# Patient Record
Sex: Male | Born: 1940 | Race: White | Hispanic: No | Smoking: Former smoker
Health system: Southern US, Community
[De-identification: ages and names within clinical notes are randomized; demographics above are authoritative.]

## PROBLEM LIST (undated history)

## (undated) DIAGNOSIS — I739 Peripheral vascular disease, unspecified: Secondary | ICD-10-CM

## (undated) DIAGNOSIS — M109 Gout, unspecified: Secondary | ICD-10-CM

## (undated) DIAGNOSIS — C609 Malignant neoplasm of penis, unspecified: Secondary | ICD-10-CM

## (undated) DIAGNOSIS — N189 Chronic kidney disease, unspecified: Secondary | ICD-10-CM

## (undated) DIAGNOSIS — Z720 Tobacco use: Secondary | ICD-10-CM

## (undated) DIAGNOSIS — E119 Type 2 diabetes mellitus without complications: Secondary | ICD-10-CM

## (undated) DIAGNOSIS — E785 Hyperlipidemia, unspecified: Secondary | ICD-10-CM

## (undated) DIAGNOSIS — I10 Essential (primary) hypertension: Secondary | ICD-10-CM

## (undated) DIAGNOSIS — M1990 Unspecified osteoarthritis, unspecified site: Secondary | ICD-10-CM

## (undated) DIAGNOSIS — IMO0001 Reserved for inherently not codable concepts without codable children: Secondary | ICD-10-CM

## (undated) DIAGNOSIS — I2510 Atherosclerotic heart disease of native coronary artery without angina pectoris: Secondary | ICD-10-CM

## (undated) DIAGNOSIS — R9439 Abnormal result of other cardiovascular function study: Secondary | ICD-10-CM

## (undated) HISTORY — PX: PENECTOMY: SHX741

## (undated) HISTORY — DX: Tobacco use: Z72.0

## (undated) HISTORY — DX: Atherosclerotic heart disease of native coronary artery without angina pectoris: I25.10

## (undated) HISTORY — DX: Malignant neoplasm of penis, unspecified: C60.9

## (undated) HISTORY — DX: Type 2 diabetes mellitus without complications: E11.9

## (undated) HISTORY — DX: Hyperlipidemia, unspecified: E78.5

## (undated) HISTORY — DX: Chronic kidney disease, unspecified: N18.9

---

## 1978-01-03 HISTORY — PX: BLADDER SURGERY: SHX569

## 2002-08-16 ENCOUNTER — Ambulatory Visit (HOSPITAL_COMMUNITY): Admission: RE | Admit: 2002-08-16 | Discharge: 2002-08-16 | Payer: Self-pay | Admitting: Urology

## 2003-11-12 ENCOUNTER — Ambulatory Visit: Payer: Self-pay | Admitting: Family Medicine

## 2004-02-24 ENCOUNTER — Ambulatory Visit: Payer: Self-pay | Admitting: Family Medicine

## 2004-07-26 ENCOUNTER — Ambulatory Visit: Payer: Self-pay | Admitting: Family Medicine

## 2004-08-13 ENCOUNTER — Observation Stay (HOSPITAL_COMMUNITY): Admission: RE | Admit: 2004-08-13 | Discharge: 2004-08-14 | Payer: Self-pay | Admitting: Urology

## 2005-11-29 ENCOUNTER — Encounter: Payer: Self-pay | Admitting: Cardiology

## 2008-02-15 ENCOUNTER — Encounter: Payer: Self-pay | Admitting: Cardiology

## 2008-02-19 ENCOUNTER — Ambulatory Visit: Payer: Self-pay | Admitting: Cardiology

## 2008-03-18 ENCOUNTER — Ambulatory Visit: Payer: Self-pay | Admitting: Cardiology

## 2008-03-18 ENCOUNTER — Encounter: Payer: Self-pay | Admitting: Cardiology

## 2008-03-19 ENCOUNTER — Ambulatory Visit: Payer: Self-pay | Admitting: Cardiology

## 2008-06-11 ENCOUNTER — Encounter: Payer: Self-pay | Admitting: Cardiology

## 2008-06-15 ENCOUNTER — Encounter: Payer: Self-pay | Admitting: Cardiology

## 2008-07-18 ENCOUNTER — Encounter: Payer: Self-pay | Admitting: Cardiology

## 2008-07-28 ENCOUNTER — Encounter: Payer: Self-pay | Admitting: Physician Assistant

## 2008-07-28 ENCOUNTER — Ambulatory Visit: Payer: Self-pay | Admitting: Cardiology

## 2008-09-02 ENCOUNTER — Encounter: Payer: Self-pay | Admitting: Cardiology

## 2008-09-19 ENCOUNTER — Encounter (INDEPENDENT_AMBULATORY_CARE_PROVIDER_SITE_OTHER): Payer: Self-pay | Admitting: *Deleted

## 2008-09-22 ENCOUNTER — Telehealth (INDEPENDENT_AMBULATORY_CARE_PROVIDER_SITE_OTHER): Payer: Self-pay | Admitting: *Deleted

## 2008-11-19 DIAGNOSIS — I2510 Atherosclerotic heart disease of native coronary artery without angina pectoris: Secondary | ICD-10-CM | POA: Insufficient documentation

## 2008-11-19 DIAGNOSIS — R0602 Shortness of breath: Secondary | ICD-10-CM | POA: Insufficient documentation

## 2008-11-19 DIAGNOSIS — E7800 Pure hypercholesterolemia, unspecified: Secondary | ICD-10-CM | POA: Insufficient documentation

## 2008-11-20 ENCOUNTER — Ambulatory Visit: Payer: Self-pay | Admitting: Cardiology

## 2008-11-20 DIAGNOSIS — N189 Chronic kidney disease, unspecified: Secondary | ICD-10-CM | POA: Insufficient documentation

## 2008-11-26 ENCOUNTER — Encounter: Payer: Self-pay | Admitting: Cardiology

## 2009-01-05 ENCOUNTER — Encounter (INDEPENDENT_AMBULATORY_CARE_PROVIDER_SITE_OTHER): Payer: Self-pay | Admitting: *Deleted

## 2009-02-04 ENCOUNTER — Encounter: Payer: Self-pay | Admitting: Cardiology

## 2009-03-16 ENCOUNTER — Encounter: Payer: Self-pay | Admitting: Cardiology

## 2009-03-26 ENCOUNTER — Encounter: Payer: Self-pay | Admitting: Cardiology

## 2009-05-21 ENCOUNTER — Ambulatory Visit: Payer: Self-pay | Admitting: Cardiology

## 2009-05-21 DIAGNOSIS — I10 Essential (primary) hypertension: Secondary | ICD-10-CM | POA: Insufficient documentation

## 2009-10-12 ENCOUNTER — Encounter: Payer: Self-pay | Admitting: Cardiology

## 2009-11-20 ENCOUNTER — Encounter: Payer: Self-pay | Admitting: Cardiology

## 2009-11-27 ENCOUNTER — Ambulatory Visit: Payer: Self-pay | Admitting: Cardiology

## 2010-02-02 NOTE — Miscellaneous (Signed)
Summary: correct med list  Clinical Lists Changes  Medications: Removed medication of AMLODIPINE BESYLATE 2.5 MG TABS (AMLODIPINE BESYLATE) Take 1 tablet by mouth once a day

## 2010-02-02 NOTE — Assessment & Plan Note (Signed)
Summary: 6 MO FU PER MAY REMINDER-SRS   Visit Type:  Follow-up Primary Provider:  Sherryll Burger   History of Present Illness: 70 year old male, with presumed CAD, who presents for scheduled followup.  He continues to do quite well, experiencing mild chest discomfort only on rare occasion. His last episode was approximately one month ago, while mowing the lawn. He typically takes one Imdur tablet in these instances, and has never taken sublingual nitroglycerin.  Preventive Screening-Counseling & Management  Alcohol-Tobacco     Smoking Status: quit     Year Quit: 1968  Current Medications (verified): 1)  Imdur 30 Mg Xr24h-Tab (Isosorbide Mononitrate) .... Take 1 Tablet By Mouth Once A Day 2)  Nitroglycerin 0.4 Mg Subl (Nitroglycerin) .... Place 1 Tablet Under Tongue As Directed 3)  Mevacor 20 Mg Tabs (Lovastatin) .... Take 1 Tab By Mouth At Bedtime 4)  Metrogel 1 % Gel (Metronidazole) .... Daily 5)  Alprazolam 0.5 Mg Tabs (Alprazolam) .... Take 1 Tablet By Mouth Once A Day 6)  Fish Oil Maximum Strength 1200 Mg Caps (Omega-3 Fatty Acids) .... Take 1 Tablet By Mouth Once A Day 7)  Aspirin 81 Mg Tbec (Aspirin) .... Take 1 Tablet By Mouth Once A Day 8)  Vitamin B-12 1000 Mcg Tabs (Cyanocobalamin) .... Take 1 Tablet By Mouth Once A Day 9)  Vitamin C Cr 500 Mg Cr-Caps (Ascorbic Acid) .... Take 1 Tablet By Mouth Once A Day 10)  Multivitamins  Tabs (Multiple Vitamin) .... Take 1 Tablet By Mouth Once A Day 11)  Cranberry Concentrate 100-3 Mg-Unit Caps (Vitamins C E) .... Take 1 Tablet By Mouth Once A Day 12)  Vitamin D 2000 Unit Tabs (Cholecalciferol) .... Take 1 Tablet By Mouth Once A Day 13)  Amoxicillin 500 Mg Caps (Amoxicillin) .... As Needed 14)  Benadryl Allergy/sinus Headach 12.5-5-325 Mg Tabs (Diphenhydramine-Pe-Apap) .... As Needed 15)  Ra Suphedrine Pe Sinus 5-325 Mg Tabs (Phenylephrine-Acetaminophen) .... As Needed 16)  Tums 500 Mg Chew (Calcium Carbonate Antacid) .... As Needed 17)   Alka-Seltzer Extra Strength 500-1-1.985 Mg-Gm-Gm Tbef (Aspirin Effervescent) .... As Needed 18)  Amlodipine Besylate 2.5 Mg Tabs (Amlodipine Besylate) .... Take 1 Tablet By Mouth Once A Day 19)  Colcrys 0.6 Mg Tabs (Colchicine) .... Take 1 Tablet By Mouth Once A Day As Needed 20)  Fexofenadine-Pseudoephedrine 60-120 Mg Xr12h-Tab (Fexofenadine-Pseudoephedrine) .... Take 1 Tablet By Mouth Once A Day As Needed 21)  Aleve 220 Mg Tabs (Naproxen Sodium) .... Take 1 Tablet By Mouth Once A Day As Needed 22)  Acidophilus  Caps (Lactobacillus) .... Take 1 Tablet By Mouth Once A Day  Allergies (verified): No Known Drug Allergies  Comments:  Nurse/Medical Assistant: The patient's medications and allergies were reviewed with the patient and were updated in the Medication and Allergy Lists. List reviewed.  Past History:  Past Medical History:   . Presumed coronary artery disease.     a.     Quiescent on medical therapy.     b.     Abnormal exercise Cardiolite suggestive of apical ischemia;      EF 54%, 2/10 2. Abnormal LFTs, resolved.     a.     Secondary to Lipitor. 3. Chronic renal insufficiency.     a.     Single functional kidney. 4. Remote tobacco.    Review of Systems       No fevers, chills, hemoptysis, dysphagia, melena, hematocheezia, hematuria, rash, claudication, orthopnea, pnd, pedal edema. All other systems negative.   Vital Signs:  Patient profile:   70 year old male Height:      71 inches Weight:      217 pounds Pulse rate:   72 / minute BP sitting:   138 / 88  (left arm) Cuff size:   large  Vitals Entered By: Carlye Grippe (May 21, 2009 2:56 PM)  Physical Exam  Additional Exam:  GEN:70 year old male, sitting upright, in no distress HEENT: NCAT,PERRLA,EOMI NECK: palpable pulses, no bruits; no JVD; no TM LUNGS: CTA bilaterally HEART: RRR (S1S2); no significant murmurs; no rubs; no gallops ABD: soft, NT; intact BS EXT: intact distal pulses; no edema SKIN: warm,  dry MUSC: no obvious deformity NEURO: A/O (x3)     Impression & Recommendations:  Problem # 1:  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 Will renew prescription for p.r.n. nitroglycerin. Will schedule a return visit with Dr. Andee Lineman in 6 months.  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 Patient has history of elevated LFTs on Lipitor, since resolved.  Will reassess with a fasting lipid/liver profile in 6 months.  Problem # 3:  ESSENTIAL HYPERTENSION, BENIGN (ICD-401.1) patient advised to start taking amlodipine 2.5 mg daily, rather than on p.r.n. basis. Would not rechallenge with ACE inhibitor, secondary to CRI and single functioning kidney.  Problem # 4:  RENAL FAILURE, CHRONIC (ICD-585.9) Assessment: Comment Only  Patient Instructions: 1)  Nitroglycerin as needed for severe chest pain 2)  Amlodipine 2.5mg  daily 3)  Labs in 6 months prior to next office visit. 4)  Follow up in  6 months.   Prescriptions: MEVACOR 20 MG TABS (LOVASTATIN) Take 1 tab by mouth at bedtime  #90 x 3   Entered by:   Hoover Brunette, LPN   Authorized by:   Lewayne Bunting, MD, [facility]   Signed by:   Nelida Meuse, PA-C on 05/21/2009   Method used:   Electronically to        [facility] [address] [phone]* (retail)       [address]       [place], [place]  [address]       Ph: [phone]       Fax: [phone]   RxID:   [id number] IMDUR 30 MG XR24H-TAB (ISOSORBIDE MONONITRATE) Take 1 tablet by mouth once a day  #90 x 3   Entered by:   Hoover Brunette, LPN   Authorized by:   Lewayne Bunting, MD, [facility]   Signed by:   Nelida Meuse, PA-C on 05/21/2009   Method used:   Electronically to        [facility] [address] [phone]* (retail)       [address]       [place], [place]  [address]       Ph: [phone]       Fax: [phone]   RxID:    [id number] AMLODIPINE BESYLATE 2.5 MG TABS (AMLODIPINE BESYLATE) Take 1 tablet by mouth once a day  #30 x 6   Entered by:   Hoover Brunette, LPN   Authorized by:   Lewayne Bunting, MD, [facility]   Signed by:   Hoover Brunette, LPN on 30/16/0109   Method used:   Electronically to        [facility] [address] [phone]* (retail)       [address]       [place], [place]  [address]  Ph: [phone]       Fax: [phone]   RxID:   [id number]   Handout requested. NITROSTAT 0.4 MG SUBL (NITROGLYCERIN) dissolve one tablet under tongue for severe chest pain as needed every 5 minutes, not to exceed 3 in 15 min time frame  #25 x 3   Entered by:   Hoover Brunette, LPN   Authorized by:   Lewayne Bunting, MD, [facility]   Signed by:   Hoover Brunette, LPN on 03/47/4259   Method used:   Electronically to        [facility] [address] (retail)       [address]       [place], [place]  [address]       Ph: [phone]       Fax: [phone]   RxID:   [id number]

## 2010-02-02 NOTE — Miscellaneous (Signed)
Summary: Orders Update - flp/lft  Clinical Lists Changes  Orders: Added new Test order of T-Lipid Profile ([id number]) - Signed Added new Test order of T-Hepatic Function ([id number]) - Signed 

## 2010-02-02 NOTE — Assessment & Plan Note (Signed)
Summary: 6 MO FU NOV REMINDER   Visit Type:  Follow-up Primary Provider:  Dr. Franchot Mimes   History of Present Illness: the patient is a 70 year old male with presumed coronary artery disease based on abnormal exercise Cardiolite study suggestive of apical ischemia every 2010. His ejection fraction however is normal at 54%. The patient has been stable and has done very well with medical management. The patient was confused about the use of isosorbide mononitrate and did not know that he had to take his daily. He has had no chest pain shortness of breath orthopnea PND. We reviewed his liver function tests as well as lipid panel today in the office. His LDL is at goal at 76 mg percent cholesterol of 156 HDL 61 mg percent. EKG was also reviewed and found to be within normal limits. The patient is stable from a cardiovascular perspective.  the patient had an abnormal ultrasound done earlier this year. There was some concern of a renal mass. MRI however did not confirm a significant abnormality.  Preventive Screening-Counseling & Management  Alcohol-Tobacco     Smoking Status: quit  Comments: quit smoking 35-40 yrs ago, smoked for about 20-25 yrs  Current Medications (verified): 1)  Imdur 30 Mg Xr24h-Tab (Isosorbide Mononitrate) .... Take 1 Tablet By Mouth Once A Day 2)  Nitrostat 0.4 Mg Subl (Nitroglycerin) .... Dissolve One Tablet Under Tongue For Severe Chest Pain As Needed Every 5 Minutes, Not To Exceed 3 in 15 Min Time Frame 3)  Mevacor 20 Mg Tabs (Lovastatin) .... Take 1 Tab By Mouth At Bedtime 4)  Metrogel 1 % Gel (Metronidazole) .... Daily 5)  Alprazolam 0.5 Mg Tabs (Alprazolam) .... Take 1 Tablet By Mouth Once A Day 6)  Fish Oil Maximum Strength 1200 Mg Caps (Omega-3 Fatty Acids) .... Take 1 Tablet By Mouth Once A Day 7)  Aspirin 81 Mg Tbec (Aspirin) .... Take 1 Tablet By Mouth Once A Day 8)  Vitamin B-12 1000 Mcg Tabs (Cyanocobalamin) .... Take 1 Tablet By Mouth Once A Day 9)  Vitamin C  Cr 500 Mg Cr-Caps (Ascorbic Acid) .... Take 1 Tablet By Mouth Once A Day 10)  Multivitamins  Tabs (Multiple Vitamin) .... Take 1 Tablet By Mouth Once A Day 11)  Cranberry Concentrate 100-3 Mg-Unit Caps (Vitamins C E) .... Take 1 Tablet By Mouth Once A Day 12)  Vitamin D 2000 Unit Tabs (Cholecalciferol) .... Take 1 Tablet By Mouth Once A Day 13)  Amoxicillin 500 Mg Caps (Amoxicillin) .... As Needed 14)  Benadryl Allergy/sinus Headach 12.5-5-325 Mg Tabs (Diphenhydramine-Pe-Apap) .... As Needed 15)  Ra Suphedrine Pe Sinus 5-325 Mg Tabs (Phenylephrine-Acetaminophen) .... As Needed 16)  Tums 500 Mg Chew (Calcium Carbonate Antacid) .... As Needed 17)  Alka-Seltzer Extra Strength 500-1-1.985 Mg-Gm-Gm Tbef (Aspirin Effervescent) .... As Needed 18)  Colcrys 0.6 Mg Tabs (Colchicine) .... Take 1 Tablet By Mouth Once A Day As Needed 19)  Fexofenadine-Pseudoephedrine 60-120 Mg Xr12h-Tab (Fexofenadine-Pseudoephedrine) .... Take 1 Tablet By Mouth Once A Day As Needed 20)  Aleve 220 Mg Tabs (Naproxen Sodium) .... Take 1 Tablet By Mouth Once A Day As Needed 21)  Acidophilus  Caps (Lactobacillus) .... Take 1 Tablet By Mouth Once A Day 22)  Amlodipine Besylate 2.5 Mg Tabs (Amlodipine Besylate) .... Take 1 Tablet By Mouth Once A Day 23)  Ibuprofen 200 Mg Tabs (Ibuprofen) .... Take 1 Tablet By Mouth Two Times A Day As Needed  Allergies: No Known Drug Allergies  Comments:  Nurse/Medical Assistant: The  patient's medications were reviewed with the patient and were updated in the Medication List. Pt brought a list of medications to office visit. Pt has only been taking Imdur as needed for chest pain. He was not aware tihs is prescribed for once daily use. Cyril Loosen, RN, BSN (November 27, 2009 10:44 AM)  Past History:  Past Medical History: Last updated: 05/21/2009   . Presumed coronary artery disease.     a.     Quiescent on medical therapy.     b.     Abnormal exercise Cardiolite suggestive of apical  ischemia;      EF 54%, 2/10 2. Abnormal LFTs, resolved.     a.     Secondary to Lipitor. 3. Chronic renal insufficiency.     a.     Single functional kidney. 4. Remote tobacco.    Family History: Last updated: 11/20/2008 noncontributory  Social History: Last updated: 11/20/2008 the patient currently does not smoke.  Risk Factors: Smoking Status: quit (11/27/2009)  Review of Systems  The patient denies fatigue, malaise, fever, weight gain/loss, vision loss, decreased hearing, hoarseness, chest pain, palpitations, shortness of breath, prolonged cough, wheezing, sleep apnea, coughing up blood, abdominal pain, blood in stool, nausea, vomiting, diarrhea, heartburn, incontinence, blood in urine, muscle weakness, joint pain, leg swelling, rash, skin lesions, headache, fainting, dizziness, depression, anxiety, enlarged lymph nodes, easy bruising or bleeding, and environmental allergies.    Vital Signs:  Patient profile:   70 year old male Height:      71 inches Weight:      223.25 pounds BMI:     31.25 Pulse rate:   70 / minute BP sitting:   142 / 87  (left arm) Cuff size:   large  Vitals Entered By: Cyril Loosen, RN, BSN (November 27, 2009 10:39 AM)  Nutrition Counseling: Patient's BMI is greater than 25 and therefore counseled on weight management options. Comments Follow up office visit. No cardiac complaints.   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 He also received refills of his amlodipine. He states that at home his blood pressure runs more in the normal range. His updated medication list for this problem includes:    Aspirin 81 Mg Tbec (Aspirin) .Marland Kitchen... Take 1 tablet by mouth once a day    Alka-seltzer Extra Strength 500-1-1.985 Mg-gm-gm Tbef (Aspirin effervescent) .Marland Kitchen... As needed    Amlodipine Besylate 2.5 Mg Tabs (Amlodipine besylate) .Marland Kitchen... Take 1 tablet by mouth once a day  Problem # 2:  RENAL FAILURE, CHRONIC (ICD-585.9) the patient has single functioning kidney with a creatinine of around 2.  Problem # 3:  SHORTNESS OF BREATH (ICD-786.05) resolved. His updated medication list for this problem includes:    Aspirin 81 Mg Tbec (Aspirin) .Marland Kitchen... Take 1 tablet by mouth once a day    Alka-seltzer Extra Strength 500-1-1.985 Mg-gm-gm Tbef (Aspirin effervescent) .Marland Kitchen... As needed    Amlodipine Besylate 2.5 Mg Tabs (Amlodipine besylate) .Marland Kitchen... 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 His updated medication list for this problem includes:    Mevacor 20 Mg Tabs (Lovastatin) .Marland Kitchen... Take 1 tab by mouth at bedtime  Other Orders: EKG w/ Interpretation (93000)  Patient Instructions: 1)  Your physician recommends that you continue on your current medications as directed. Please refer to the Current Medication list given to you today. 2)  Follow up in  1 year  Prescriptions: AMLODIPINE BESYLATE 2.5 MG TABS (AMLODIPINE BESYLATE) Take 1 tablet by mouth once a day  #90 x 3   Entered by:   Hoover Brunette, LPN   Authorized by:   Lewayne Bunting, MD, [facility]   Signed by:   Hoover Brunette, LPN on 14/78/2956   Method used:   Electronically to        [facility] [address] [phone]* (retail)       [address]       [place], [place]  [address]       Ph: [phone]       Fax: [phone]   RxID:   [id number] MEVACOR 20 MG TABS (LOVASTATIN) Take 1 tab by mouth  at bedtime  #90 x 3   Entered by:   Hoover Brunette, LPN   Authorized by:   Lewayne Bunting, MD, [facility]   Signed by:   Hoover Brunette, LPN on 63/87/5643   Method used:   Electronically to        [facility] [address] [phone]* (retail)       [address]       [place], [place]  [address]       Ph: [phone]       Fax: [phone]   RxID:   [id number] IMDUR 30 MG XR24H-TAB (ISOSORBIDE MONONITRATE) Take 1 tablet by mouth once a day  #90 x 3   Entered by:   Hoover Brunette, LPN   Authorized by:   Lewayne Bunting, MD, [facility]   Signed by:   Hoover Brunette, LPN on 15/17/6160   Method used:   Electronically to        [facility] [address]* (retail)       [address]       [place], [place]  [address]       Ph: [phone]       Fax: [phone]   RxID:   [id number]

## 2010-02-02 NOTE — Letter (Signed)
Summary: Engineer, materials at [facility]  [address]   [place], [place] [address]   Phone: [phone]  Fax: [phone]        January 05, 2009 MRN: [id number]   STATON MARKEY [address] [place], [place]  [address]   Dear Mr. WIDMER,  Your test ordered by Selena Batten has been reviewed by your physician (or physician assistant) and was found to be normal or stable. Your physician (or physician assistant) felt no changes were needed at this time.  ____ Echocardiogram  ____ Cardiac Stress Test  __X__ Lab Work - cholesterol at goal  ____ Peripheral vascular study of arms, legs or neck  ____ CT scan or X-ray  ____ Lung or Breathing test  ____ Other:   Thank you.   Hoover Brunette, LPN    Duane Boston, M.D., F.A.C.C. Thressa Sheller, M.D., F.A.C.C. Oneal Grout, M.D., F.A.C.C. Cheree Ditto, M.D., F.A.C.C. Daiva Nakayama, M.D., F.A.C.C. Kenney Houseman, M.D., F.A.C.C. Jeanne Ivan, PA-C

## 2010-05-18 NOTE — Assessment & Plan Note (Signed)
[facility]                          [facility]ICE NOTE   NAME:[name] [name], [name] [name]                         MRN:          [id number]  DATE:07/28/2008                            DOB:          [birth year]    ADDENDUM   IMPRESSION:  Remote tobacco.     Gene Serpe, PA-C     GS/MedQ  DD: 07/28/2008  DT: 07/29/2008  Job #: [id number]

## 2010-05-18 NOTE — Assessment & Plan Note (Signed)
[name]                          [facility] NOTE   NAME:[name] [name], [name] [name]                         MRN:          [id number]  DATE:03/19/2008                            DOB:          [birth year]    REFERRING PHYSICIAN:  Kirstie Peri, MD   HISTORY OF PRESENT ILLNESS:  The patient is a 70 year old male with no  prior history of coronary artery disease.  The patient had abnormal  Cardiolite study in Dr. Margaretmary Eddy office.  However, there was only a small  perfusion defect.  The patient has a single functioning kidney with a  creatinine of 1.8.  I opted to treat him medically first.  The patient  did complain of some shortness of breath and chest pain on exertion.  This has now entirely resolved on minimal medical therapy.  He also  performed followup lab work.  His creatinine is stable at 1.9, BNP 71  and echocardiogram demonstrates normal LV function.  No significant  valvular abnormalities.   MEDICATIONS:  1. Lisinopril 10 mg p.o. daily.  2. MetroGel to forehead daily.  3. Multivitamin.  4. Fish oil daily.  5. Vitamin C.  6. Biotin.  7. Imdur 30 mg p.o. daily.  8. Lipitor 40 mg daily.  9. Aspirin 81 mg p.o. daily.   PHYSICAL EXAMINATION:  VITAL SIGNS:  Blood pressure is 107/70, heart  rate is 66, and weight is 217 pounds.  NECK:  Normal carotid upstroke.  No carotid bruits.  LUNGS:  Clear breath sounds bilaterally.  HEART:  Regular rate and rhythm.  Normal S1 and S2.  No murmurs, rubs,  or gallops.  ABDOMEN:  Soft and nontender.  No rebound or guarding.  Good bowel  sounds.  EXTREMITIES:  No cyanosis, clubbing, or edema.  NEUROLOGIC:  The patient is alert, oriented, and grossly non focal.   PROBLEM LIST:  1. Dyspnea and chest pain resolved.  2. Abnormal Cardiolite study, likely consistent with coronary artery      disease.  3. Normal left ventricular function.  4. Single functional kidney with a creatinine of 1.8-1.9.   PLAN:  1.  The patient continues to do well on medical therapy.  There is no      indication for catheterizations.  2. The patient states now that he is able to plant a tree and walk in      his yard without any      difficulty.  In light of his normal ejection fraction, I think he      is at low-risk and we will continue with medical therapy.     Learta Codding, MD,FACC  Electronically Signed    GED/MedQ  DD: 03/19/2008  DT: 03/19/2008  Job #: [id number]   cc:   Kirstie Peri, MD

## 2010-05-18 NOTE — Assessment & Plan Note (Signed)
[facility]                          [facility] NOTE   NAME:JOYCERyzen, Deady                         MRN:          [id number]  DATE:07/28/2008                            DOB:          06-06-1940    PRIMARY CARDIOLOGIST:  Learta Codding, MD,FACC   REASON FOR VISIT:  Scheduled followup.   Mr. Trulson denies any interim development of exertional angina pectoris,  since his last visit here in March.  He has presumed coronary artery  disease, in light of an abnormal exercise perfusion imaging study  earlier this year.   Mr. Kendra also returns following recent followup hepatic profile showing  marked improvement in his elevated LFTs from this past June.  He was  started on Lipitor 40 mg daily, per Dr. Andee Lineman, at time of his initial  referral in February.   Of note, Mr. Piscitello denies any development of adverse effects while on  Lipitor.  The recent profile shows complete normalization of the AST and  ALT, with marked decrease in alk phos to mildly elevated level of 103.  LDL has risen back up to 108, from the previous level of 58.   CURRENT MEDICATIONS:  1. Aspirin 81 daily.  2. Imdur 30 daily.  3. Lisinopril 10 daily.  4. Fish oil 1 capsule daily.   PHYSICAL EXAMINATION:  VITAL SIGNS:  Blood pressure 138/83, pulse 67,  regular, weight 218.  GENERAL:  A 70 year old male, sitting upright, in no distress.  HEENT:  Normocephalic, atraumatic.  NECK:  Palpable carotid pulses without bruits.  LUNGS:  Clear to auscultation in all fields.  HEART:  Regular rate and rhythm.  No significant murmurs.  ABDOMEN:  Soft, nontender.  EXTREMITIES:  No edema.  NEURO:  No focal deficits.   IMPRESSION:  1. Presumed coronary artery disease.      a.     Quiescent on medical therapy.      b.     Abnormal exercise Cardiolite suggestive of apical ischemia;       EF 54%.  2. Abnormal LFTs, resolved.      a.     Secondary to Lipitor.  3. Chronic renal  insufficiency.      a.     Single functional kidney.  4. Remote tobacco.   PLAN:  1. Following review with Dr. Andee Lineman, recommendation is to rechallenge      with a statin.  However, we will choose lovastatin at 20 mg daily.      We will order early follow up LFTs in 6 weeks.  2. Schedule return clinic followup with myself and Dr. Andee Lineman in 3      months.      Gene Serpe, PA-C  Electronically Signed      Learta Codding, MD,FACC  Electronically Signed   GS/MedQ  DD: 07/28/2008  DT: 07/29/2008  Job #: [id number]   cc:   Kirstie Peri, MD

## 2010-08-19 ENCOUNTER — Other Ambulatory Visit: Payer: Self-pay | Admitting: *Deleted

## 2010-08-19 MED ORDER — LOVASTATIN 20 MG PO TABS: 20.0000 mg | ORAL_TABLET | Freq: Every day | ORAL | Status: DC

## 2010-11-08 ENCOUNTER — Encounter: Payer: Self-pay | Admitting: *Deleted

## 2010-11-10 ENCOUNTER — Ambulatory Visit (INDEPENDENT_AMBULATORY_CARE_PROVIDER_SITE_OTHER): Payer: 59 | Admitting: Cardiology

## 2010-11-10 ENCOUNTER — Encounter: Payer: Self-pay | Admitting: Cardiology

## 2010-11-10 VITALS — BP 124/80 | HR 80 | Ht 71.0 in | Wt 219.0 lb

## 2010-11-10 DIAGNOSIS — E7800 Pure hypercholesterolemia, unspecified: Secondary | ICD-10-CM

## 2010-11-10 DIAGNOSIS — I10 Essential (primary) hypertension: Secondary | ICD-10-CM

## 2010-11-10 DIAGNOSIS — I2510 Atherosclerotic heart disease of native coronary artery without angina pectoris: Secondary | ICD-10-CM

## 2010-11-10 MED ORDER — ISOSORBIDE MONONITRATE ER 30 MG PO TB24: 30.0000 mg | ORAL_TABLET | Freq: Every day | ORAL | Status: DC

## 2010-11-10 MED ORDER — LOVASTATIN 20 MG PO TABS: 20.0000 mg | ORAL_TABLET | Freq: Every day | ORAL | Status: DC

## 2010-11-10 NOTE — Assessment & Plan Note (Signed)
No recurrent chest pain. Continue risk factor modification in particularly diabetes mellitus and dyslipidemia.

## 2010-11-10 NOTE — Progress Notes (Signed)
CC: Followup patient with presumed coronary artery disease  HPI:  The patient is a 70 year old male with history of prior abnormal exercise Cardiolite study with possible apical ischemia in 2010. He has a normal ejection fraction. From a cardiac standpoint is doing well. He has been diagnosed with diabetes mellitus and been placed on oral hypoglycemic therapy. His lipid panels followed closely by his primary care physician and is within normal limits with an LDL cholesterol of 88. The patient denies any chest pain shortness of breath orthopnea PND. He has no palpitations presyncope or syncope.   PMH: reviewed and listed in Problem List in Electronic Records (and see below)  Allergies/SH/FHX : available in Electronic Records for review  Medications: Current Outpatient Prescriptions  Medication Sig Dispense Refill  . ALPRAZolam (XANAX) 0.5 MG tablet Take 0.5 mg by mouth daily.        Marland Kitchen amLODipine (NORVASC) 2.5 MG tablet Take 2.5 mg by mouth daily.        Marland Kitchen amoxicillin (AMOXIL) 500 MG capsule Take 500 mg by mouth as needed.        Marland Kitchen aspirin EC 81 MG tablet Take 81 mg by mouth daily.        . calcium carbonate (TUMS - DOSED IN MG ELEMENTAL CALCIUM) 500 MG chewable tablet Chew 1 tablet by mouth daily as needed.        . Calcium Carbonate Antacid (ALKA-SELTZER ANTACID) 500 MG CAPS Take 1 capsule by mouth as needed.        . Cholecalciferol (VITAMIN D) 2000 UNITS CAPS Take 1 capsule by mouth daily.       . colchicine 0.6 MG tablet Take 0.6 mg by mouth daily as needed.        . Cranberry 1000 MG CAPS Take 1 capsule by mouth daily.        . diphenhydrAMINE (SOMINEX) 25 MG tablet Take 25 mg by mouth at bedtime as needed.        . fexofenadine-pseudoephedrine (ALLEGRA-D) 60-120 MG per tablet Take 1 tablet by mouth daily as needed.        Marland Kitchen glimepiride (AMARYL) 2 MG tablet Take 2 mg by mouth 2 (two) times daily.        Marland Kitchen ibuprofen (ADVIL,MOTRIN) 200 MG tablet Take 200 mg by mouth 2 (two) times daily as  needed.        . isosorbide mononitrate (IMDUR) 30 MG 24 hr tablet Take 1 tablet (30 mg total) by mouth daily.  90 tablet  3  . Lactobacillus (ACIDOPHILUS) CAPS Take 1 capsule by mouth daily.        Marland Kitchen lovastatin (MEVACOR) 20 MG tablet Take 1 tablet (20 mg total) by mouth at bedtime.  90 tablet  3  . metroNIDAZOLE (METROGEL) 1 % gel Apply 1 application topically daily.        . Multiple Vitamin (MULTIVITAMIN) tablet Take 1 tablet by mouth daily.        . naproxen sodium (ANAPROX) 220 MG tablet Take 220 mg by mouth daily as needed.        . nitroGLYCERIN (NITROSTAT) 0.4 MG SL tablet Place 0.4 mg under the tongue every 5 (five) minutes as needed.        . Omega-3 Fatty Acids (FISH OIL) 1200 MG CAPS Take 1 capsule by mouth daily.        Marland Kitchen Phenylephrine-Acetaminophen (RA SUPHEDRINE PE SINUS) 5-325 MG TABS Take 1 tablet by mouth as needed.        Marland Kitchen  Saw Palmetto, Serenoa repens, 450 MG CAPS Take 2 capsules by mouth daily.        . vitamin B-12 (CYANOCOBALAMIN) 1000 MCG tablet Take 1,000 mcg by mouth daily.        . vitamin C (ASCORBIC ACID) 500 MG tablet Take 500 mg by mouth daily.          ROS: No nausea or vomiting. No fever or chills.No melena or hematochezia.No bleeding.No claudication  Physical Exam: BP 124/80  Pulse 80  Ht 5\' 11"  (1.803 m)  Wt 219 lb (99.338 kg)  BMI 30.54 kg/m2 General: Well-nourished white male in no apparent distress Neck: Normal carotid upstroke no carotid bruits. No thyromegaly. No nodular thyroid. JVP is approximately 5 cm Lungs: Clear breath sounds bilaterally. No wheezing Cardiac: Regular rate and rhythm with normal S1-S2 and no murmur rubs or gallops Vascular: No edema. Normal peripheral pulses dorsalis pedis and posterior tibial pulses Skin: Warm and dry  12lead ECG: Normal sinus rhythm. No acute ischemic changes Limited bedside ECHO:N/A   Assessment and Plan

## 2010-11-10 NOTE — Patient Instructions (Signed)
Continue all current medications. Your physician wants you to follow up in:  2 years.  You will receive a reminder letter in the mail one-two months in advance.  If you don't receive a letter, please call our office to schedule the follow up appointment   

## 2010-11-10 NOTE — Assessment & Plan Note (Signed)
Continue medical therapy with lovastatin.

## 2010-11-10 NOTE — Assessment & Plan Note (Signed)
Blood pressure well controlled. Continue medical therapy 

## 2011-10-27 ENCOUNTER — Other Ambulatory Visit: Payer: Self-pay | Admitting: Cardiology

## 2011-10-27 MED ORDER — ISOSORBIDE MONONITRATE ER 30 MG PO TB24: 30.0000 mg | ORAL_TABLET | Freq: Every day | ORAL | Status: DC

## 2011-11-25 ENCOUNTER — Other Ambulatory Visit: Payer: Self-pay | Admitting: Cardiology

## 2011-11-25 MED ORDER — LOVASTATIN 20 MG PO TABS: 20.0000 mg | ORAL_TABLET | Freq: Every day | ORAL | Status: DC

## 2012-01-04 HISTORY — PX: SQUAMOUS CELL CARCINOMA EXCISION: SHX2433

## 2012-11-14 ENCOUNTER — Encounter: Payer: Self-pay | Admitting: Cardiovascular Disease

## 2012-11-14 ENCOUNTER — Ambulatory Visit (INDEPENDENT_AMBULATORY_CARE_PROVIDER_SITE_OTHER): Payer: 59 | Admitting: Cardiovascular Disease

## 2012-11-14 VITALS — BP 121/73 | HR 73 | Ht 71.0 in | Wt 221.0 lb

## 2012-11-14 DIAGNOSIS — R9439 Abnormal result of other cardiovascular function study: Secondary | ICD-10-CM

## 2012-11-14 DIAGNOSIS — R931 Abnormal findings on diagnostic imaging of heart and coronary circulation: Secondary | ICD-10-CM

## 2012-11-14 DIAGNOSIS — I2510 Atherosclerotic heart disease of native coronary artery without angina pectoris: Secondary | ICD-10-CM

## 2012-11-14 MED ORDER — AMOXICILLIN 500 MG PO CAPS: 500.0000 mg | ORAL_CAPSULE | ORAL | Status: DC | PRN

## 2012-11-14 MED ORDER — NITROGLYCERIN 0.4 MG SL SUBL: 0.4000 mg | SUBLINGUAL_TABLET | SUBLINGUAL | Status: AC | PRN

## 2012-11-14 NOTE — Progress Notes (Signed)
Patient ID: [name] [name], male   DOB: 12-08-1940, 72 y.o.   MRN: [id number]      SUBJECTIVE: [name] [name] is a 72 year old male who reportedly has a history of a prior abnormal exercise Cardiolite study with possible apical ischemia in 2010. He has a normal ejection fraction. He also has HTN, hyperlipidemia, and diabetes.  He denies ever having had chest pain. He said the stress test was performed routinely at his primary care physician's office. Apparently, his PCP told him he may require cardiac catheterization. He then saw Dr. Andee Lineman for cardiology followup for several appointments, and was then released and told that he may want to return in 2 years.  He is here for his routine followup visit. He denies any chest pain, palpitations, lightheadedness, and dizziness. He said he is always felt some mild shortness of breath with heavy exertion but this has been going on for decades.  He's wondering if he can come off of his long acting nitrate.     No Known Allergies  Current Outpatient Prescriptions  Medication Sig Dispense Refill  . ALPRAZolam (XANAX) 0.5 MG tablet Take 0.5 mg by mouth daily.        Marland Kitchen amLODipine (NORVASC) 2.5 MG tablet Take 2.5 mg by mouth daily.        Marland Kitchen amoxicillin (AMOXIL) 500 MG capsule Take 500 mg by mouth as needed.        Marland Kitchen aspirin EC 81 MG tablet Take 81 mg by mouth daily.        . Ca Carbonate-Mag Hydroxide (ROLAIDS PO) Take 1 tablet by mouth daily.      . Calcium Carbonate Antacid (ALKA-SELTZER ANTACID) 500 MG CAPS Take 1 capsule by mouth as needed.        Marland Kitchen CINNAMON PO Take 2 tablets by mouth daily.      . Coenzyme Q10 (COQ-10) 100 MG CAPS Take 1 capsule by mouth daily.      . colchicine 0.6 MG tablet Take 0.6 mg by mouth daily as needed.        . diphenhydrAMINE (SOMINEX) 25 MG tablet Take 25 mg by mouth at bedtime as needed.        . fexofenadine-pseudoephedrine (ALLEGRA-D) 60-120 MG per tablet Take 1 tablet by mouth daily as needed.        Marland Kitchen glimepiride  (AMARYL) 2 MG tablet Take 2 mg by mouth 2 (two) times daily.        . isosorbide mononitrate (IMDUR) 30 MG 24 hr tablet Take 1 tablet (30 mg total) by mouth daily.  90 tablet  3  . Lactobacillus (ACIDOPHILUS) CAPS Take 1 capsule by mouth 2 (two) times a week.       . lovastatin (MEVACOR) 20 MG tablet Take 1 tablet (20 mg total) by mouth at bedtime.  90 tablet  3  . metroNIDAZOLE (METROGEL) 1 % gel Apply 1 application topically daily.        . Multiple Vitamin (MULTIVITAMIN) tablet Take 1 tablet by mouth daily.        . naproxen sodium (ANAPROX) 220 MG tablet Take 440 mg by mouth daily as needed.       . nitroGLYCERIN (NITROSTAT) 0.4 MG SL tablet Place 0.4 mg under the tongue every 5 (five) minutes as needed.        . Omega-3 Fatty Acids (FISH OIL) 1200 MG CAPS Take 1 capsule by mouth daily.        Marland Kitchen Phenylephrine-Acetaminophen (RA SUPHEDRINE PE SINUS)  5-325 MG TABS Take 1 tablet by mouth as needed.         No current facility-administered medications for this visit.    Past Medical History  Diagnosis Date  . Coronary artery disease     quiescent on medical therapy,abnormal exercise Cardiolite suggestive of apical ischemia;EF 54%,02/10  . Hyperlipidemia     abnormal LFT's,resolved,secondary to lipitor  . Chronic kidney disease     chronic renal insufficiency,single functional kidney  . Tobacco abuse   . 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 Bladder surgery  1980  . Penectomy      surgery for carcinoma of penis    History   Social History  . Marital Status: Widowed    Spouse Name: N/A    Number of Children: N/A  . Years of Education: N/A   Occupational History  . Not on file.   Social History Main Topics  . Smoking status: Former Smoker -- 1.00 packs/day for 25 years    Types: Cigarettes    Quit date: 01/03/1970  . Smokeless tobacco: Never Used  . Alcohol Use: No  . Drug Use: Not on file  .  Sexual Activity: Not on file   Other Topics Concern  . Not on file   Social History Narrative  . No narrative on file     Filed Vitals:   11/14/12 1039  BP: 121/73  Pulse: 73  Height: 5\' 11"  (1.803 m)  Weight: 221 lb (100.245 kg)    PHYSICAL EXAM General: NAD Neck: No JVD, no thyromegaly or thyroid nodule.  Lungs: Clear to auscultation bilaterally with normal respiratory effort. CV: Nondisplaced PMI.  Heart regular S1/S2, no S3/S4, no murmur.  No peripheral edema.  No carotid bruit.  Normal pedal pulses.  Abdomen: Soft, nontender, no hepatosplenomegaly, no distention.  Neurologic: Alert and oriented x 3.  Psych: Normal affect. Extremities: No clubbing or cyanosis.   ECG: reviewed and available in electronic records.      ASSESSMENT AND PLAN: 1. Abnormal stress test, in the setting of an absence of symptoms: At this point, I do not feel he needs any further noninvasive testing at this time. He is entirely asymptomatic. I will discontinue his long-acting nitrate. I did tell him that should he have chest pain, he may take up to 3 sublingual nitroglycerin tablets. If he needs to take 3, he should call 911. I will schedule him for an appointment in 2 years. I did tell him that should he experience any new symptoms such as chest pain, shortness of breath, or palpitations, to call me and I would be happy to see him much sooner. He is in agreement with this plan.     Prentice Docker, M.D., F.A.C.C.

## 2012-11-14 NOTE — Patient Instructions (Addendum)
   Amoxicillin 500mg  - refill sent to pharm  Stop Imdur  Nitroglycerin as needed for severe chest pain only - refill sent to pharm  Continue all other medications.   Your physician wants you to follow up in:  2 years.  You will receive a reminder letter in the mail one-two months in advance.  If you don't receive a letter, please call our office to schedule the follow up appointment

## 2012-12-13 ENCOUNTER — Other Ambulatory Visit: Payer: Self-pay | Admitting: Physician Assistant

## 2013-12-06 ENCOUNTER — Other Ambulatory Visit: Payer: Self-pay | Admitting: Cardiovascular Disease

## 2014-05-02 ENCOUNTER — Encounter: Payer: Self-pay | Admitting: Vascular Surgery

## 2014-05-02 ENCOUNTER — Encounter: Payer: Self-pay | Admitting: *Deleted

## 2014-05-02 ENCOUNTER — Other Ambulatory Visit: Payer: Self-pay | Admitting: *Deleted

## 2014-05-02 DIAGNOSIS — I6521 Occlusion and stenosis of right carotid artery: Secondary | ICD-10-CM

## 2014-05-14 ENCOUNTER — Encounter: Payer: Self-pay | Admitting: Vascular Surgery

## 2014-05-15 ENCOUNTER — Other Ambulatory Visit: Payer: Self-pay | Admitting: Vascular Surgery

## 2014-05-15 ENCOUNTER — Ambulatory Visit (INDEPENDENT_AMBULATORY_CARE_PROVIDER_SITE_OTHER): Payer: 59 | Admitting: Vascular Surgery

## 2014-05-15 ENCOUNTER — Ambulatory Visit (HOSPITAL_COMMUNITY)
Admission: RE | Admit: 2014-05-15 | Discharge: 2014-05-15 | Disposition: A | Discharge status: Home / Self Care | Payer: Medicare Other | Source: Ambulatory Visit | Attending: Vascular Surgery | Admitting: Vascular Surgery

## 2014-05-15 ENCOUNTER — Encounter: Payer: Self-pay | Admitting: Vascular Surgery

## 2014-05-15 VITALS — BP 126/73 | HR 74 | Resp 18 | Ht 69.5 in | Wt 213.7 lb

## 2014-05-15 DIAGNOSIS — I6521 Occlusion and stenosis of right carotid artery: Secondary | ICD-10-CM | POA: Insufficient documentation

## 2014-05-15 LAB — VAS US CAROTID
RCCADSYS: 71 cm/s
RICAMSYS: 104 cm/s
RIGHT CCA MID DIAS: 15 cm/s
RIGHT CCA MID SYS: 92 cm/s
RIGHT ECA DIAS: 12 cm/s
Right CCA dist dias: 15 cm/s
Right CCA prox dias: 20 cm/s
Right CCA prox sys: 142 cm/s
Right ICA dist dias: 16 cm/s
Right ICA dist sys: 48 cm/s
Right ICA mid dias: 30 cm/s
Right ICA prox dias: 155 cm/s
Right ICA prox sys: 424 cm/s
Right eca sys: 123 cm/s
Right subclavian sys: 229 cm/s

## 2014-05-15 NOTE — Progress Notes (Signed)
[facility] HISTORY AND PHYSICAL   History of Present Illness:  Patient is a 74 y.o. 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 The patient was noted to have a bruit on carotid exam by his primary care physician. Further evaluation with duplex exam suggested high-grade stenosis on the right side. He denies any prior symptoms of TIA amaurosis or stroke. Other medical problems include coronary artery disease, hyperlipidemia, chronic kidney disease, tobacco abuse (quit over 40 years ago), diabetes all of which are currently controlled. He is currently on aspirin daily.  Past Medical History  Diagnosis Date  . Coronary artery disease     quiescent on medical therapy,abnormal exercise Cardiolite suggestive of apical ischemia;EF 54%,02/10  . Hyperlipidemia     abnormal LFT's,resolved,secondary to lipitor  . Chronic kidney disease     chronic renal insufficiency,single functional kidney  . Tobacco abuse   . 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 Diabetes mellitus without complication     Past Surgical History  Procedure Laterality Date  . Bladder surgery  1980  . Penectomy      surgery for carcinoma of penis  . 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 Smoking status: Former Smoker -- 1.00 packs/day for 25 years    Types: Cigarettes    Quit date: 01/03/1970  . Smokeless tobacco: Never Used  . Alcohol Use: No    Family History Family History  Problem Relation Age of Onset  . Liver disease Mother   . Gallbladder disease Father   . Heart attack Sister   . Bone cancer Sister     Allergies  No Known Allergies   Current Outpatient Prescriptions  Medication Sig Dispense Refill  . ALPRAZolam (XANAX) 0.5 MG tablet Take 0.5 mg by mouth daily.      Marland Kitchen amLODipine (NORVASC) 2.5 MG tablet Take 5 mg by mouth  daily.     Marland Kitchen amoxicillin (AMOXIL) 500 MG capsule Take 1 capsule (500 mg total) by mouth as needed. 21 capsule 0  . aspirin EC 81 MG tablet Take 81 mg by mouth daily.      . Calcium Carbonate Antacid (ALKA-SELTZER ANTACID) 500 MG CAPS Take 1 capsule by mouth as needed.      . Cholecalciferol (VITAMIN D3) 2000 UNITS TABS Take by mouth daily.    Marland Kitchen CINNAMON PO Take 2 tablets by mouth daily.    . Coenzyme Q10 (COQ-10) 100 MG CAPS Take 1 capsule by mouth daily.    . colchicine 0.6 MG tablet Take 0.6 mg by mouth daily as needed.      Marland Kitchen glimepiride (AMARYL) 2 MG tablet Take 2 mg by mouth 2 (two) times daily.      . isosorbide mononitrate (IMDUR) 30 MG 24 hr tablet Take 30 mg by mouth daily.    . Lactobacillus (ACIDOPHILUS) CAPS Take 1 capsule by mouth 2 (two) times a week.     . losartan (COZAAR) 25 MG tablet Take 25 mg by mouth daily.    Marland Kitchen lovastatin (MEVACOR) 20 MG tablet TAKE ONE TABLET BY MOUTH AT BEDTIME 90 tablet 3  . metroNIDAZOLE (METROGEL) 1 % gel Apply 1 application topically daily.      . Omega-3 Fatty Acids (FISH OIL) 1200 MG CAPS Take 1 capsule by mouth daily.      Marland Kitchen  Phenylephrine-Acetaminophen (RA SUPHEDRINE PE SINUS) 5-325 MG TABS Take 1 tablet by mouth as needed.      . sodium polystyrene (KAYEXALATE) 15 GM/60ML suspension Take by mouth once a week.    . vitamin C (ASCORBIC ACID) 500 MG tablet Take 500 mg by mouth daily.    . Ca Carbonate-Mag Hydroxide (ROLAIDS PO) Take 1 tablet by mouth daily.    . diphenhydrAMINE (SOMINEX) 25 MG tablet Take 25 mg by mouth at bedtime as needed.      . fexofenadine-pseudoephedrine (ALLEGRA-D) 60-120 MG per tablet Take 1 tablet by mouth daily as needed.      . Multiple Vitamin (MULTIVITAMIN) tablet Take 1 tablet by mouth daily.      . naproxen sodium (ANAPROX) 220 MG tablet Take 440 mg by mouth daily as needed.     . nitroGLYCERIN (NITROSTAT) 0.4 MG SL tablet Place 1 tablet (0.4 mg total) under the tongue every 5 (five) minutes as needed. (Patient not  taking: Reported on 05/15/2014) 25 tablet 3   No current facility-administered medications for this visit.    ROS:   General:  No weight loss, Fever, chills  HEENT: No recent headaches, no nasal bleeding, no visual changes, no sore throat  Neurologic: No dizziness, blackouts, seizures. No recent symptoms of stroke or mini- stroke. No recent episodes of slurred speech, or temporary blindness.  Cardiac: No recent episodes of chest pain/pressure, no shortness of breath at rest.  No shortness of breath with exertion.  Denies history of atrial fibrillation or irregular heartbeat  Vascular: No history of rest pain in feet.  No history of claudication.  No history of non-healing ulcer, No history of DVT   Pulmonary: No home oxygen, no productive cough, no hemoptysis,  No asthma or wheezing  Musculoskeletal:  [ ]  Arthritis, [ ]  Low back pain,  [ ]  Joint pain  Hematologic:No history of hypercoagulable state.  No history of easy bleeding.  No history of anemia  Gastrointestinal: No hematochezia or melena,  No gastroesophageal reflux, no trouble swallowing  Urinary: [x]  chronic Kidney disease, [ ]  on HD - [ ]  MWF or [ ]  TTHS, [ ]  Burning with urination, [ ]  Frequent urination, [ ]  Difficulty urinating;   Skin: No rashes  Psychological: No history of anxiety,  No history of depression   Physical Examination  Filed Vitals:   05/15/14 1507  BP: 126/73  Pulse: 74  Resp: 18  Height: 5' 9.5" (1.765 m)  Weight: 213 lb 11.2 oz (96.934 kg)    Body mass index is 31.12 kg/(m^2).  General:  Alert and oriented, no acute distress HEENT: Normal Neck: No bruit or JVD Pulmonary: Clear to auscultation bilaterally Cardiac: Regular Rate and Rhythm without murmur Abdomen: Soft, non-tender, non-distended, no mass, diastases recti  Skin: No rash Extremity Pulses:  2+ radial, brachial, femoral, posterior tibial pulses bilaterally Musculoskeletal: No deformity or edema  Neurologic: Upper and lower  extremity motor 5/5 and symmetric, no facial asymmetry tongue midline  DATA:  I reviewed the patient's prior carotid duplex exam suggesting high-grade right internal carotid artery stenosis. This showed greater than 70% stenosis right internal carotid artery with less than 50% left internal carotid artery stenosis. We repeated the right side carotid duplex in our office today which again showed a high-grade stenosis with peak systolic velocity of 409 cm per sec and end-diastolic of 811 cm/s which suggests greater than 80% stenosis. This was at the mid hyoid notch there was normal internal carotid artery past  level of stenosis of the lesion was 1.6 cm.   ASSESSMENT:  Greater than 80% stenosis asymptomatic right internal carotid artery with minimal left carotid disease   PLAN:  Continue aspirin. Patient will be scheduled for cardiac risk stratification. Patient would benefit from right carotid endarterectomy for stroke prophylaxis. Risks benefits possible complications and procedure details of right carotid endarterectomy were discussed with the patient today. Benefit of surgical therapy versus medical therapy was also discussed with the patient. Risks of stroke 1-2% cranial nerve injury 10% bleeding infection both less than 1% were discussed with the patient today. He wishes to proceed with operation. We will schedule him for right carotid endarterectomy after cardiac risk stratification.  [name], MD [facility] Office: [phone] Pager: [phone]

## 2014-05-19 ENCOUNTER — Other Ambulatory Visit: Payer: Self-pay

## 2014-05-22 ENCOUNTER — Encounter: Payer: Self-pay | Admitting: *Deleted

## 2014-05-22 ENCOUNTER — Ambulatory Visit (INDEPENDENT_AMBULATORY_CARE_PROVIDER_SITE_OTHER): Payer: Medicare Other | Admitting: Cardiovascular Disease

## 2014-05-22 ENCOUNTER — Encounter: Payer: Self-pay | Admitting: Cardiovascular Disease

## 2014-05-22 VITALS — BP 122/70 | HR 73 | Ht 70.0 in | Wt 218.0 lb

## 2014-05-22 DIAGNOSIS — Z01818 Encounter for other preprocedural examination: Secondary | ICD-10-CM | POA: Diagnosis not present

## 2014-05-22 DIAGNOSIS — J329 Chronic sinusitis, unspecified: Secondary | ICD-10-CM

## 2014-05-22 DIAGNOSIS — R0602 Shortness of breath: Secondary | ICD-10-CM

## 2014-05-22 DIAGNOSIS — I6521 Occlusion and stenosis of right carotid artery: Secondary | ICD-10-CM

## 2014-05-22 DIAGNOSIS — R9439 Abnormal result of other cardiovascular function study: Secondary | ICD-10-CM | POA: Diagnosis not present

## 2014-05-22 DIAGNOSIS — R5383 Other fatigue: Secondary | ICD-10-CM

## 2014-05-22 MED ORDER — AMOXICILLIN 500 MG PO CAPS: 500.0000 mg | ORAL_CAPSULE | Freq: Two times a day (BID) | ORAL | Status: DC

## 2014-05-22 NOTE — Patient Instructions (Signed)
Your physician has requested that you have en exercise stress myoview. For further information please visit [email or web address]. Please follow instruction sheet, as given. Office will contact with results via phone or letter.   Refills given on Amoxicillin 500mg  twice a day   Continue all other medications.   Your physician wants you to follow up in:  1 year.  You will receive a reminder letter in the mail one-two months in advance.  If you don't receive a letter, please call our office to schedule the follow up appointment

## 2014-05-22 NOTE — Progress Notes (Addendum)
Patient ID: [name] [name], male   DOB: 09-Sep-1940, 74 y.o.   MRN: [id number]      SUBJECTIVE: [name] [name] is a 74 year old male who reportedly has a history of a prior abnormal exercise Cardiolite study with possible apical ischemia in 2010. He has a normal ejection fraction. He also has HTN, hyperlipidemia, and diabetes. I last saw him in November 2014.   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 He is being scheduled for a carotid endarterectomy and presents for preoperative risk stratification.   He denies any chest pain, palpitations, lightheadedness, and dizziness. However, he has felt slightly more fatigued and short of breath after routine outdoor tasks which did not cause him to feel like this a year ago.  He requests amoxicillin for a sinus infection.  ECG demonstrates normal sinus rhythm with no ischemic ST segment or T-wave abnormalities.   Review of Systems: As per "subjective", otherwise negative.  No Known Allergies  Current Outpatient Prescriptions  Medication Sig Dispense Refill  . ALPRAZolam (XANAX) 0.5 MG tablet Take 0.5 mg by mouth daily.      Marland Kitchen amLODipine (NORVASC) 2.5 MG tablet Take 5 mg by mouth daily.     Marland Kitchen amoxicillin (AMOXIL) 500 MG capsule Take 1 capsule (500 mg total) by mouth as needed. 21 capsule 0  . aspirin EC 81 MG tablet Take 81 mg by mouth daily.      . Ca Carbonate-Mag Hydroxide (ROLAIDS PO) Take 1 tablet by mouth daily.    . Calcium Carbonate Antacid (ALKA-SELTZER ANTACID) 500 MG CAPS Take 1 capsule by mouth as needed.      . Cholecalciferol (VITAMIN D3) 2000 UNITS TABS Take by mouth daily.    Marland Kitchen CINNAMON PO Take 2 tablets by mouth daily.    . Coenzyme Q10 (COQ-10) 100 MG CAPS Take 1 capsule by mouth daily.    . colchicine 0.6 MG tablet Take 0.6 mg by mouth daily as needed.      . diphenhydrAMINE (SOMINEX) 25 MG tablet Take 25 mg by mouth at  bedtime as needed.      . fexofenadine-pseudoephedrine (ALLEGRA-D) 60-120 MG per tablet Take 1 tablet by mouth daily as needed.      Marland Kitchen glimepiride (AMARYL) 2 MG tablet Take 2 mg by mouth 2 (two) times daily.      . isosorbide mononitrate (IMDUR) 30 MG 24 hr tablet Take 30 mg by mouth daily.    . Lactobacillus (ACIDOPHILUS) CAPS Take 1 capsule by mouth 2 (two) times a week.     . losartan (COZAAR) 25 MG tablet Take 25 mg by mouth daily.    Marland Kitchen lovastatin (MEVACOR) 20 MG tablet TAKE ONE TABLET BY MOUTH AT BEDTIME 90 tablet 3  . metroNIDAZOLE (METROGEL) 1 % gel Apply 1 application topically daily.      . Multiple Vitamin (MULTIVITAMIN) tablet Take 1 tablet by mouth daily.      . naproxen sodium (ANAPROX) 220 MG tablet Take 440 mg by mouth daily as needed.     . nitroGLYCERIN (NITROSTAT) 0.4 MG SL tablet Place 1 tablet (0.4 mg total) under the tongue every 5 (five) minutes as needed. 25 tablet 3  . Omega-3 Fatty Acids (FISH OIL) 1200 MG CAPS Take 1 capsule by mouth daily.      Marland Kitchen Phenylephrine-Acetaminophen (RA SUPHEDRINE PE SINUS) 5-325 MG TABS Take 1 tablet by mouth as needed.      Marland Kitchen  sodium polystyrene (KAYEXALATE) 15 GM/60ML suspension Take by mouth once a week.    . vitamin C (ASCORBIC ACID) 500 MG tablet Take 500 mg by mouth daily.     No current facility-administered medications for this visit.    Past Medical History  Diagnosis Date  . Coronary artery disease     quiescent on medical therapy,abnormal exercise Cardiolite suggestive of apical ischemia;EF 54%,02/10  . Hyperlipidemia     abnormal LFT's,resolved,secondary to lipitor  . Chronic kidney disease     chronic renal insufficiency,single functional kidney  . Tobacco abuse   . 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 Diabetes mellitus without complication     Past Surgical History  Procedure Laterality Date  . Bladder surgery  1980  . Penectomy      surgery for carcinoma of penis  . 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 Marital Status: Widowed    Spouse Name: N/A  . Number of Children: N/A  . Years of Education: N/A   Occupational History  . Not on file.   Social History Main Topics  . Smoking status: Former Smoker -- 1.00 packs/day for 25 years    Types: Cigarettes    Quit date: 01/03/1970  . Smokeless tobacco: Never Used  . Alcohol Use: No  . Drug Use: Not on file  . Sexual Activity: Not on file   Other Topics Concern  . Not on file   Social History Narrative     Filed Vitals:   05/22/14 1347  BP: 122/70  Pulse: 73  Height: 5\' 10"  (1.778 m)  Weight: 218 lb (98.884 kg)  SpO2: 97%    PHYSICAL EXAM General: NAD HEENT: Normal. Neck: No JVD, no thyromegaly. Lungs: Clear to auscultation bilaterally with normal respiratory effort. CV: Nondisplaced PMI.  Regular rate and rhythm, normal S1/S2, no S3/S4, no murmur. Trace pretibial and periankle edema.  No carotid bruit.   Abdomen: Soft, nontender, no hepatosplenomegaly, no distention.  Neurologic: Alert and oriented x 3.  Psych: Normal affect. Skin: Normal. Musculoskeletal: Normal range of motion, no gross deformities. Extremities: No clubbing or cyanosis.   ECG: Most recent ECG reviewed.      ASSESSMENT AND PLAN: 1. Preoperative risk stratification given prior abnormal stress test with complaints of exertional fatigue and shortness of breath: Symptoms may represent stable angina but not entirely clear. To be prudent, given his other CV risk factors, will obtain an exercise Cardiolite stress test for further clarification. Continue ASA and statin therapy. Likely at a low to intermediate risk for a major adverse CV event given additional comorbidities, but nuclear MPI study will provide greater accuracy with respect to risk stratification.  2. Essential HTN: Well controlled. No changes.  3. Hyperlipidemia: Currently on low-intensity statin therapy with lovastatin 20  mg. Will obtain copy of lipids.  4. Sinus infection: Will prescribe amoxicillin.  Dispo: f/u 1 year.   Kate Sable, M.D., F.A.C.C.  ADDENDUM: Nuclear stress test dated 05/23/14 demonstrated a moderate-sized inferior wall infarct with moderate peri-infarct ischemia, as well as a completely reversible moderate-sized region of apical ischemia, and deemed a high risk stress test.  Based upon this information, the patient would be at a high risk for a major adverse cardiac event in the perioperative period.

## 2014-05-23 ENCOUNTER — Encounter (HOSPITAL_COMMUNITY)
Admission: RE | Admit: 2014-05-23 | Discharge: 2014-05-23 | Disposition: A | Discharge status: Home / Self Care | Payer: Medicare Other | Source: Ambulatory Visit | Attending: Cardiovascular Disease | Admitting: Cardiovascular Disease

## 2014-05-23 ENCOUNTER — Encounter (HOSPITAL_COMMUNITY): Payer: Self-pay

## 2014-05-23 ENCOUNTER — Inpatient Hospital Stay (HOSPITAL_COMMUNITY): Admission: RE | Admit: 2014-05-23 | Payer: Medicare Other | Source: Ambulatory Visit

## 2014-05-23 DIAGNOSIS — R0602 Shortness of breath: Secondary | ICD-10-CM | POA: Diagnosis not present

## 2014-05-23 DIAGNOSIS — R9439 Abnormal result of other cardiovascular function study: Secondary | ICD-10-CM | POA: Insufficient documentation

## 2014-05-23 HISTORY — DX: Essential (primary) hypertension: I10

## 2014-05-23 MED ORDER — TECHNETIUM TC 99M SESTAMIBI - CARDIOLITE
30.0000 | Freq: Once | INTRAVENOUS | Status: AC | PRN
  Administered 2014-05-23: 30 via INTRAVENOUS

## 2014-05-23 MED ORDER — REGADENOSON 0.4 MG/5ML IV SOLN
INTRAVENOUS | Status: AC
  Filled 2014-05-23: qty 5

## 2014-05-23 MED ORDER — TECHNETIUM TC 99M SESTAMIBI GENERIC - CARDIOLITE
10.0000 | Freq: Once | INTRAVENOUS | Status: AC | PRN
  Administered 2014-05-23: 10 via INTRAVENOUS

## 2014-05-23 MED ORDER — SODIUM CHLORIDE 0.9 % IJ SOLN
INTRAMUSCULAR | Status: AC
  Administered 2014-05-23: 10 mL
  Filled 2014-05-23: qty 3

## 2014-05-23 MED ORDER — SODIUM CHLORIDE 0.9 % IJ SOLN: 10.0000 mL | INTRAMUSCULAR | Status: DC | PRN

## 2014-05-26 ENCOUNTER — Encounter: Payer: Self-pay | Admitting: *Deleted

## 2014-05-27 ENCOUNTER — Encounter (HOSPITAL_COMMUNITY)
Admission: RE | Admit: 2014-05-27 | Discharge: 2014-05-27 | Disposition: A | Discharge status: Home / Self Care | Payer: Medicare Other | Source: Ambulatory Visit | Attending: Vascular Surgery | Admitting: Vascular Surgery

## 2014-05-27 ENCOUNTER — Encounter (HOSPITAL_COMMUNITY): Payer: Self-pay

## 2014-05-27 DIAGNOSIS — Z01812 Encounter for preprocedural laboratory examination: Secondary | ICD-10-CM | POA: Diagnosis not present

## 2014-05-27 DIAGNOSIS — I129 Hypertensive chronic kidney disease with stage 1 through stage 4 chronic kidney disease, or unspecified chronic kidney disease: Secondary | ICD-10-CM | POA: Insufficient documentation

## 2014-05-27 DIAGNOSIS — N183 Chronic kidney disease, stage 3 (moderate): Secondary | ICD-10-CM | POA: Diagnosis not present

## 2014-05-27 DIAGNOSIS — E119 Type 2 diabetes mellitus without complications: Secondary | ICD-10-CM | POA: Insufficient documentation

## 2014-05-27 DIAGNOSIS — I2510 Atherosclerotic heart disease of native coronary artery without angina pectoris: Secondary | ICD-10-CM | POA: Insufficient documentation

## 2014-05-27 HISTORY — DX: Reserved for inherently not codable concepts without codable children: IMO0001

## 2014-05-27 HISTORY — DX: Gout, unspecified: M10.9

## 2014-05-27 HISTORY — DX: Unspecified osteoarthritis, unspecified site: M19.90

## 2014-05-27 LAB — COMPREHENSIVE METABOLIC PANEL
ALT: 17 U/L (ref 17–63)
ANION GAP: 9 (ref 5–15)
AST: 18 U/L (ref 15–41)
Albumin: 3.8 g/dL (ref 3.5–5.0)
Alkaline Phosphatase: 72 U/L (ref 38–126)
BUN: 43 mg/dL — AB (ref 6–20)
CALCIUM: 9.6 mg/dL (ref 8.9–10.3)
CO2: 26 mmol/L (ref 22–32)
Chloride: 106 mmol/L (ref 101–111)
Creatinine, Ser: 2.45 mg/dL — ABNORMAL HIGH (ref 0.61–1.24)
GFR calc Af Amer: 28 mL/min — ABNORMAL LOW (ref >60)
GFR calc non Af Amer: 24 mL/min — ABNORMAL LOW (ref >60)
GLUCOSE: 120 mg/dL — AB (ref 65–99)
Potassium: 5.5 mmol/L — ABNORMAL HIGH (ref 3.5–5.1)
Sodium: 141 mmol/L (ref 135–145)
Total Bilirubin: 0.5 mg/dL (ref 0.3–1.2)
Total Protein: 6.7 g/dL (ref 6.5–8.1)

## 2014-05-27 LAB — SURGICAL PCR SCREEN
MRSA, PCR: NEGATIVE
Staphylococcus aureus: NEGATIVE

## 2014-05-27 LAB — URINALYSIS, ROUTINE W REFLEX MICROSCOPIC
Bilirubin Urine: NEGATIVE
GLUCOSE, UA: NEGATIVE mg/dL
Hgb urine dipstick: NEGATIVE
Ketones, ur: NEGATIVE mg/dL
LEUKOCYTES UA: NEGATIVE
NITRITE: NEGATIVE
Protein, ur: NEGATIVE mg/dL
SPECIFIC GRAVITY, URINE: 1.012 (ref 1.005–1.030)
Urobilinogen, UA: 0.2 mg/dL (ref 0.0–1.0)
pH: 7 (ref 5.0–8.0)

## 2014-05-27 LAB — CBC
HEMATOCRIT: 40.7 % (ref 39.0–52.0)
Hemoglobin: 13.2 g/dL (ref 13.0–17.0)
MCH: 31.4 pg (ref 26.0–34.0)
MCHC: 32.4 g/dL (ref 30.0–36.0)
MCV: 96.9 fL (ref 78.0–100.0)
Platelets: 400 10*3/uL (ref 150–400)
RBC: 4.2 MIL/uL — ABNORMAL LOW (ref 4.22–5.81)
RDW: 12.9 % (ref 11.5–15.5)
WBC: 11 10*3/uL — AB (ref 4.0–10.5)

## 2014-05-27 LAB — ABO/RH: ABO/RH(D): O POS

## 2014-05-27 LAB — APTT: APTT: 33 s (ref 24–37)

## 2014-05-27 LAB — PROTIME-INR
INR: 1.12 (ref 0.00–1.49)
PROTHROMBIN TIME: 14.5 s (ref 11.6–15.2)

## 2014-05-27 LAB — GLUCOSE, CAPILLARY: Glucose-Capillary: 125 mg/dL — ABNORMAL HIGH (ref 65–99)

## 2014-05-27 LAB — TYPE AND SCREEN
ABO/RH(D): O POS
Antibody Screen: NEGATIVE

## 2014-05-27 NOTE — Progress Notes (Signed)
   05/27/14 1257  OBSTRUCTIVE SLEEP APNEA  Have you ever been diagnosed with sleep apnea through a sleep study? No  Do you snore loudly (loud enough to be heard through closed doors)?  0  Do you often feel tired, fatigued, or sleepy during the daytime? 0  Has anyone observed you stop breathing during your sleep? 0  Do you have, or are you being treated for high blood pressure? 1  BMI more than 35 kg/m2? 0  Age over 76 years old? 1  Neck circumference greater than 40 cm/16 inches? 1  Gender: 1  Obstructive Sleep Apnea Score 4   This patient ha screened at risk for sleep apnea using the STOP bang tool used during a pre-surgical visit. A score of 4 or greater is at risk for sleep apnea.

## 2014-05-27 NOTE — Pre-Procedure Instructions (Signed)
[name] [name]  05/27/2014     [address] [place] [address] Phone: [phone] Fax: [phone]    Your procedure is scheduled on: Tuesday Jun 03, 2014 at 7:30 AM.  Report to [facility] Admitting at 5:30 A.M.  Call this number if you have problems the morning of surgery: [phone]    Remember:  Do not eat food or drink liquids after midnight.  Take these medicines the morning of surgery with A SIP OF WATER : Amlodipine (Norvasc), Isosorbide (Imdur)   Do NOT take any diabetic medications the morning of your surgery   Please stop taking any vitamins, herbal medications, Fish oil, Co-Q10, Advil, Motrin, Aleve, etc   Do not wear jewelry.  Do not wear lotions, powders, or cologne.    You may shave your face.  Do not bring valuables to the hospital.  [facility] is not responsible for any belongings or valuables.  Contacts, dentures or bridgework may not be worn into surgery.  Leave your suitcase in the car.  After surgery it may be brought to your room.  For patients admitted to the hospital, discharge time will be determined by your treatment team.  Patients discharged the day of surgery will not be allowed to drive home.   Name and phone number of your driver:    Special instructions: Shower using CHG soap the night before and the morning of your surgery  Please read over the following fact sheets that you were given.Pain Booklet, Coughing and Deep Breathing, Blood Transfusion Information, MRSA Information and Surgical Site Infection Prevention

## 2014-05-27 NOTE — Progress Notes (Signed)
PCP is Monico Blitz and Cardiologist is Dr. Bronson Ing. Patient denied having any acute cardiac or pulmonary issues but did inform Nurse that he had a stress test done on 05/23/14. Patient denied having a cardiac cath or sleep study.   Nurse asked patient about fasting glucose and he stated that it ranged from the 90s to the 150s. Patient informed Nurse that he had a A1C done about a month ago at his PCP's office and it was 5.9. Will request results from PCP.

## 2014-05-28 ENCOUNTER — Telehealth: Payer: Self-pay | Admitting: Vascular Surgery

## 2014-05-28 ENCOUNTER — Encounter (HOSPITAL_COMMUNITY): Payer: Self-pay | Admitting: Vascular Surgery

## 2014-05-28 NOTE — Telephone Encounter (Signed)
Dr. Oneida Alar is requesting  PRE OP CLEARANCE FOR SURGERY . Please put a note in system and direct to his attention.

## 2014-05-28 NOTE — Progress Notes (Addendum)
Anesthesia Chart Review:  Patient is a 74 year old male scheduled for right CEA on 06/03/14 by Dr. Oneida Alar.  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 OSA screening score is 4. PCP is Dr. Monico Blitz. Urologist is Dr. Michela Pitcher. Nephrologist is listed by his PCP as Dr. Lowanda Foster, although records received were from his partner Dr. Ulice Bold with last visit 02/05/14.  Cardiologist is Dr. Bronson Ing. He previously had an abnormal Cardiolite showing possible apical ischemia in 2010 (treated medically). Patient was seen for pre-operative evaluation on 05/22/14. He reported exertional fatigue and SOB, so another Cardiolite was ordered. Currently, I do not see a final report. The last notation I see if from Dr. Bronson Ing on 05/23/14 asking where are the nuclear images. I left a voice message with his nurse regarding follow-up results.   Meds include Xanax, amlodipine, ASA, Amaryl, Imdur, Cozaar, Nitro, fish oil, Kayexalate weekly.  05/22/14 EKG: NSR. Isolated Q wave in III.  05/15/14 Right carotid duplex: 1. Evidence of >80% stenosis of the right internal carotid artery. Lesion is 1.6cm in length with a normal vessel distally. 04/28/14 Bilateral carotid duplex: 70-99% RICA stenosis. 8-52% LICA stenosis. 2. Right vertebral artery is antegrade.  Pre-operative labs noted.  K 5.5. BUN 43, Cr 2.45. Records scanned under Media tab show a BUN 37, Cr 2.12 on 04/23/14, Cr 1.96 on 05/19/10 (under Media tab). Nephrology records indicate patient is being followed for CKD stage III-IV.  He has had issues with chronic hyperkalemia which has been treated with kayexalate. BUN/Cr range there since 05/29/13 was BUN 31-46 and Cr 2.01-2.38. A1C was 5.9 on 04/23/14 (under Media tab). H/H 13.2/40.7. PT/PTT WNL. T&S done.   With known history of  hyperkalemia and further elevation in his Cr since 04/2014, I will enter an ISTAT8 for the day of surgery to check stability.   I have sent Dr. Oneida Alar and [facility] RN Colletta Maryland a staff message regarding pending Cardiolite results and elevated potassium and creatinine. Colletta Maryland is also in communication with Dr. Court Joy office and has faxed patient's pre-operative CMET to his nephrologist for review.    Further input once stress test results received.  George Hugh [facility] Short Stay Center/Anesthesiology Phone [phone] 05/28/2014 3:51 PM  Addendum: 05/23/14 Nuclear stress test results now scanned under CV Procedure tab. IMPRESSION: 1. 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 Moderate sized area of apical ischemia that is completely reversible. 2. Normal LV wall motion. 3. LVEF 66%. 4. High-risk stress test findings. High risk findings due to multiple areas of ischemia. Intermediate risk Duke treadmill score of 5.  Awaiting input from cardiology if patient will need a LHC prior to surgery.  George Hugh [facility] Short Stay Center/Anesthesiology Phone [phone] 05/29/2014 11:26 AM

## 2014-05-28 NOTE — Telephone Encounter (Signed)
Cannot find nuclear perfusion results in Epic, as encounter may have been prematurely closed before entry. Would call [facility] nuclear dept to obtain final report. Can then provide accurate preoperative risk stratification.

## 2014-05-29 ENCOUNTER — Telehealth: Payer: Self-pay | Admitting: *Deleted

## 2014-05-29 ENCOUNTER — Telehealth: Payer: Self-pay | Admitting: Cardiovascular Disease

## 2014-05-29 NOTE — Telephone Encounter (Signed)
[name] [name] called stating that he had received a telephone call from Dr. Oneida Alar office and was told his surgery had been cancelled.   He is wanting to speak with Dr. Bronson Ing nurse.  Please call patient.

## 2014-05-29 NOTE — Telephone Encounter (Signed)
[  05/29/2014 11:22 AM] Kate Sable:  High risk for major adverse cardiac event in the perioperative period with regards to carotid endarterectomy. Not high risk to do cath, stress test showed high risk findings. He does need surgery. I informed Dr. Oneida Alar of results. He and patient will have to decide how best to proceed. If he has a cath and shows blockages (which is likely), he will require ASA and Plavix. If [facility] will operate on patient with ASA and Plavix, then can get cath first.

## 2014-05-29 NOTE — Telephone Encounter (Signed)
Dr. Bronson Ing has spoken with Dr. Oneida Alar regarding surgery. See telephone note

## 2014-05-29 NOTE — Progress Notes (Signed)
As of 05/29/2014, still waiting response from Dr. Bronson Ing following review of Nuclear Med. Testing.

## 2014-05-29 NOTE — Telephone Encounter (Signed)
-----   Message from Herminio Commons, MD sent at 05/29/2014 10:41 AM EDT ----- Addressed with Hardie Pulley.

## 2014-05-29 NOTE — Telephone Encounter (Signed)
Spoke with pt in detail regarding heart cath prior to carotid endarterectomy. Pt says with his age, being alone, and with the risk on kidneys with having heart cath, pt would like to proceed with surgery as planned on Tuesday. Spoke with Dr. Nona Dell nurse who asked Dr. Oneida Alar if will proceed as planned with pt understanding all associated risks involved, Dr. Nona Dell wants to bring pt in for office visit as he does not proceed without heart cath prior to carotid endarterectomy . Dr. Nona Dell nurse will call the pt to schedule office visit with Dr. Nona Dell. Will forward to Dr. Bronson Ing

## 2014-06-03 ENCOUNTER — Encounter (HOSPITAL_COMMUNITY): Admission: RE | Payer: Self-pay | Source: Ambulatory Visit

## 2014-06-03 ENCOUNTER — Inpatient Hospital Stay (HOSPITAL_COMMUNITY): Admission: RE | Admit: 2014-06-03 | Payer: Medicare Other | Source: Ambulatory Visit | Admitting: Vascular Surgery

## 2014-06-03 SURGERY — ENDARTERECTOMY, CAROTID
Anesthesia: General | Laterality: Right

## 2014-06-03 NOTE — Telephone Encounter (Signed)
Per Epic, appointment has been scheduled with Dr. Oneida Alar for 06/11/14 at 10:45 to discuss need for cath prior to surgery.

## 2014-06-09 ENCOUNTER — Encounter: Payer: Self-pay | Admitting: Vascular Surgery

## 2014-06-10 ENCOUNTER — Encounter: Payer: Self-pay | Admitting: Vascular Surgery

## 2014-06-11 ENCOUNTER — Encounter: Payer: Self-pay | Admitting: Vascular Surgery

## 2014-06-11 ENCOUNTER — Ambulatory Visit (INDEPENDENT_AMBULATORY_CARE_PROVIDER_SITE_OTHER): Payer: Medicare Other | Admitting: Vascular Surgery

## 2014-06-11 VITALS — BP 115/73 | HR 84 | Ht 70.0 in | Wt 219.0 lb

## 2014-06-11 DIAGNOSIS — I6521 Occlusion and stenosis of right carotid artery: Secondary | ICD-10-CM

## 2014-06-11 NOTE — Progress Notes (Signed)
[facility] [place] HISTORY AND PHYSICAL    History of Present Illness:  Patient is a 74 y.o. 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 The patient was noted to have a bruit on carotid exam by his primary care physician. Further evaluation with duplex exam suggested high-grade stenosis on the right side. He denies any prior symptoms of TIA amaurosis or stroke. Other medical problems include coronary artery disease, hyperlipidemia, chronic kidney disease, tobacco abuse (quit over 40 years ago), diabetes all of which are currently controlled. He is currently on aspirin daily.  He returns today after follow-up with his cardiologist. His noninvasive cardiac testing showed reversible ischemia. His risk of myocardial infarction perioperatively for carotid endarterectomy was considered to be elevated. The patient is reluctant to have a cardiac catheterization secondary to his renal dysfunction.   ROS:    General:  No weight loss, Fever, chills  Neurologic: No dizziness, blackouts, seizures. No recent symptoms of stroke or mini- stroke. No recent episodes of slurred speech, or temporary blindness.  Cardiac: No recent episodes of chest pain/pressure, no shortness of breath at rest.  No shortness of breath with exertion.  Denies history of atrial fibrillation or irregular heartbeat  Urinary: [x]  chronic Kidney disease, [ ]  on HD - [ ]  MWF or [ ]  TTHS, [ ]  Burning with urination, [ ]  Frequent urination, [ ]  Difficulty urinating;     Physical Examination     Filed Vitals:   06/11/14 1103 06/11/14 1104  BP: 121/76 115/73  Pulse: 84   Height: 5\' 10"  (1.778 m)   Weight: 99.338 kg (219 lb)   SpO2: 97%     General:  Alert and oriented, no acute distress HEENT: Normal Neck: No bruit or JVD Neurologic: Upper and lower extremity motor 5/5 and symmetric, no facial asymmetry tongue midline  DATA:  I reviewed the  patient's prior carotid duplex exam suggesting high-grade right internal carotid artery stenosis. This showed greater than 70% stenosis right internal carotid artery with less than 50% left internal carotid artery stenosis. Carotid duplex in our office today which again showed a high-grade stenosis with peak systolic velocity of 474 cm per sec and end-diastolic of 259 cm/s which suggests greater than 80% stenosis. This was at the mid hyoid notch there was normal internal carotid artery past level of stenosis of the lesion was 1.6 cm.   ASSESSMENT:  Greater than 80% stenosis asymptomatic right internal carotid artery with minimal left carotid disease.  Patient also has a positive cardiac stress test suggesting he would need cardiac catheterization prior to carotid endarterectomy.   PLAN:  Continue aspirin. I had a lengthy discussion with the patient today regarding cardiac catheterization and risk of contrast nephropathy. I discussed with him that this would be a possibility and it can be very difficult to predict which patient this would occur. I discussed with him that his stroke risk for an asymptomatic carotid lesion is about 5% per year. At this point he currently wishes to defer carotid endarterectomy because he does not want to risk possibly being on dialysis after a cardiac cath. The patient will return for follow-up carotid duplex scan in 6 months time. He'll return sooner if he develops any symptoms of TIA amaurosis or stroke. I also told him to feel free to come back at any time before 6 months if he wishes to reconsider cardiac testing prior to carotid endarterectomy.  [name], MD [facility] [place] Office: [phone] Pager: [phone]

## 2014-06-12 NOTE — Addendum Note (Signed)
Addended by: Dorthula Rue L on: 06/12/2014 06:00 PM   Modules accepted: Orders

## 2014-08-04 ENCOUNTER — Encounter: Payer: Self-pay | Admitting: *Deleted

## 2014-11-13 ENCOUNTER — Ambulatory Visit (INDEPENDENT_AMBULATORY_CARE_PROVIDER_SITE_OTHER): Payer: Medicare Other | Admitting: Cardiovascular Disease

## 2014-11-13 ENCOUNTER — Encounter: Payer: Self-pay | Admitting: Cardiovascular Disease

## 2014-11-13 VITALS — BP 130/81 | HR 83 | Ht 70.0 in | Wt 221.0 lb

## 2014-11-13 DIAGNOSIS — R931 Abnormal findings on diagnostic imaging of heart and coronary circulation: Secondary | ICD-10-CM | POA: Diagnosis not present

## 2014-11-13 DIAGNOSIS — I6521 Occlusion and stenosis of right carotid artery: Secondary | ICD-10-CM

## 2014-11-13 DIAGNOSIS — E785 Hyperlipidemia, unspecified: Secondary | ICD-10-CM

## 2014-11-13 DIAGNOSIS — I10 Essential (primary) hypertension: Secondary | ICD-10-CM | POA: Diagnosis not present

## 2014-11-13 MED ORDER — CLOPIDOGREL BISULFATE 75 MG PO TABS: 75.0000 mg | ORAL_TABLET | Freq: Every day | ORAL | Status: DC

## 2014-11-13 NOTE — Progress Notes (Signed)
Patient ID: RAKEEM COLLEY, male   DOB: January 14, 1940, 74 y.o.   MRN: [id number]      SUBJECTIVE: I last saw the patient on 05/22/14 for preoperative risk stratification prior to carotid endarterectomy for high-grade right internal carotid artery stenosis greater than 80%.  Nuclear stress test dated 05/23/14 demonstrated a moderate-sized inferior wall infarct with moderate peri-infarct ischemia, as well as a completely reversible moderate-sized region of apical ischemia, and deemed a high risk stress test.  Based upon this information, I said the patient would be at a high risk for a major adverse cardiac event in the perioperative period and recommended coronary angiography.  Labs on 11/05/14 showed BUN 36, creatinine 2.06, total cholesterol 132, glyceride 86, HDL 62, LDL 53.  He met with Dr. Oneida Alar (vascular surgery) on 06/11/14. After a lengthy discussion, the patient decided to defer carotid endarterectomy because he did not want to undergo coronary angiography due to the risk of acute on chronic renal failure potentially requiring dialysis.  He denies chest pain, palpitations, leg swelling, and shortness of breath. He is able to carry on his activities of daily living. He has several questions with regards to his stress test as well as coronary angiography and his kidney function.   Review of Systems: As per "subjective", otherwise negative.  No Known Allergies  Current Outpatient Prescriptions  Medication Sig Dispense Refill  . amLODipine (NORVASC) 5 MG tablet Take 1 tablet by mouth daily.    Marland Kitchen amoxicillin (AMOXIL) 500 MG capsule Take 1 capsule by mouth as needed.    Marland Kitchen aspirin EC 81 MG tablet Take 81 mg by mouth daily.      . Calcium Carbonate Antacid (ALKA-SELTZER ANTACID) 500 MG CAPS Take 1 capsule by mouth as needed.      . Cholecalciferol (VITAMIN D3) 2000 UNITS TABS Take 1 tablet by mouth daily.     Marland Kitchen CINNAMON PO Take 2 tablets by mouth daily.    . clonazePAM (KLONOPIN) 1 MG tablet  Take 1 tablet by mouth daily.    . clotrimazole-betamethasone (LOTRISONE) cream Apply 1 application topically as needed.    . Coenzyme Q10 (COQ-10) 100 MG CAPS Take 1 capsule by mouth daily.    . colchicine 0.6 MG tablet Take 0.6 mg by mouth daily as needed.     Marland Kitchen glimepiride (AMARYL) 2 MG tablet Take 2 mg by mouth 2 (two) times daily.      . isosorbide mononitrate (IMDUR) 30 MG 24 hr tablet Take 30 mg by mouth daily.     . Lactobacillus (ACIDOPHILUS) CAPS Take 1 capsule by mouth daily as needed.     Marland Kitchen losartan (COZAAR) 25 MG tablet Take 25 mg by mouth at bedtime.     . lovastatin (MEVACOR) 20 MG tablet Take 1 tablet by mouth daily.    . metroNIDAZOLE (METROGEL) 1 % gel Apply 1 application topically daily.      . nitroGLYCERIN (NITROSTAT) 0.4 MG SL tablet Place 1 tablet (0.4 mg total) under the tongue every 5 (five) minutes as needed. 25 tablet 3  . Omega-3 Fatty Acids (FISH OIL) 1200 MG CAPS Take 1 capsule by mouth daily.      Marland Kitchen Phenylephrine-Acetaminophen (RA SUPHEDRINE PE SINUS) 5-325 MG TABS Take 1 tablet by mouth as needed.      . sodium polystyrene (KAYEXALATE) 15 GM/60ML suspension Take 30 g by mouth every 30 (thirty) days.     . vitamin C (ASCORBIC ACID) 500 MG tablet Take 500 mg by mouth  daily.     No current facility-administered medications for this visit.    Past Medical History  Diagnosis Date  . Coronary artery disease     quiescent on medical therapy,abnormal exercise Cardiolite suggestive of apical ischemia;EF 54%,02/10  . Hyperlipidemia     abnormal LFT's,resolved,secondary to lipitor  . Chronic kidney disease     chronic renal insufficiency,single functional kidney  . Tobacco abuse   . 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 Hypertension   . Shortness of breath dyspnea     with exertion  . Diabetes mellitus without complication (HCC)     Type 2  . Arthritis   . Gout     Past Surgical History  Procedure Laterality Date  . Bladder  surgery  1980  . Penectomy      surgery for carcinoma of penis  . 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 Marital Status: Widowed    Spouse Name: N/A  . Number of Children: N/A  . Years of Education: N/A   Occupational History  . Not on file.   Social History Main Topics  . Smoking status: Former Smoker -- 1.00 packs/day for 25 years    Types: Cigarettes    Start date: 01/04/1955    Quit date: 01/03/1970  . Smokeless tobacco: Never Used  . Alcohol Use: No  . Drug Use: No  . Sexual Activity: Not on file   Other Topics Concern  . Not on file   Social History Narrative     Filed Vitals:   11/13/14 1051  BP: 130/81  Pulse: 83  Height: 5' 10" (1.778 m)  Weight: 221 lb (100.245 kg)    PHYSICAL EXAM General: NAD HEENT: Normal. Neck: No JVD, no thyromegaly. Lungs: Clear to auscultation bilaterally with normal respiratory effort. CV: Nondisplaced PMI.  Regular rate and rhythm, normal S1/S2, no S3/S4, no murmur. No pretibial or periankle edema.  No carotid bruit.  Normal pedal pulses.  Abdomen: Soft, nontender, no hepatosplenomegaly, no distention.  Neurologic: Alert and oriented x 3.  Psych: Normal affect. Skin: Normal. Musculoskeletal: Normal range of motion, no gross deformities. Extremities: No clubbing or cyanosis.   ECG: Most recent ECG reviewed.      ASSESSMENT AND PLAN: 1. CAD/high risk stress test: Symptomatically stable. Willing to consider coronary angiography. Continue medical therapy with ASA, Imdur, and statin.  I will add Plavix 75 mg daily.  2. Essential HTN: Well controlled. No changes.  3. Hyperlipidemia: Currently on low-intensity statin therapy with lovastatin 20 mg. Lipids reviewed above.  4. Carotid artery stenosis: On ASA and statin, and follows with vascular. I will add Plavix 75 mg daily.  Dispo: f/u 3 months.  Kate Sable, M.D., F.A.C.C.

## 2014-11-13 NOTE — Patient Instructions (Signed)
   Begin Plavix 75mg  daily - new sent to [facility] all other medications.   Follow up in  3 months

## 2014-12-02 ENCOUNTER — Other Ambulatory Visit: Payer: Self-pay | Admitting: Cardiovascular Disease

## 2014-12-05 ENCOUNTER — Encounter: Payer: Self-pay | Admitting: Vascular Surgery

## 2014-12-11 ENCOUNTER — Encounter: Payer: Self-pay | Admitting: Family

## 2014-12-11 ENCOUNTER — Ambulatory Visit (INDEPENDENT_AMBULATORY_CARE_PROVIDER_SITE_OTHER): Payer: Medicare Other | Admitting: Family

## 2014-12-11 ENCOUNTER — Ambulatory Visit (HOSPITAL_COMMUNITY)
Admission: RE | Admit: 2014-12-11 | Discharge: 2014-12-11 | Disposition: A | Discharge status: Home / Self Care | Payer: Medicare Other | Source: Ambulatory Visit | Attending: Vascular Surgery | Admitting: Vascular Surgery

## 2014-12-11 VITALS — BP 120/68 | HR 68 | Temp 98.2°F | Resp 16 | Ht 71.0 in | Wt 220.0 lb

## 2014-12-11 DIAGNOSIS — I6521 Occlusion and stenosis of right carotid artery: Secondary | ICD-10-CM | POA: Insufficient documentation

## 2014-12-11 NOTE — Progress Notes (Signed)
Chief Complaint: Extracranial Carotid Artery Stenosis   History of Present Illness  [name] [name] is a 74 y.o. male patient of Dr. Oneida Alar who presents for evaluation of asymptomatic high-grade right internal carotid artery stenosis. The patient was noted to have a bruit on carotid exam by his primary care physician. Further evaluation with duplex exam suggested high-grade stenosis on the right side. He denies any prior symptoms of TIA amaurosis or stroke. Other medical problems include coronary artery disease, hyperlipidemia, chronic kidney disease, tobacco abuse (quit over 40 years ago), diabetes all of which are currently controlled. He is currently on aspirin daily.  He returns today after follow-up with his cardiologist. His noninvasive cardiac testing showed reversible ischemia. His risk of myocardial infarction perioperatively for carotid endarterectomy was considered to be elevated. The patient is reluctant to have a cardiac catheterization secondary to his renal dysfunction.   Dr. Oneida Alar last saw pt on 06/11/14. At that time carotid duplex again indicated greater than 80% stenosis asymptomatic right internal carotid artery with minimal left carotid disease. Patient also has a positive cardiac stress test suggesting he would need cardiac catheterization prior to carotid endarterectomy. The plan was to continue aspirin. Dr. Oneida Alar had a lengthy discussion with the patient regarding cardiac catheterization and risk of contrast nephropathy;  discussed with him that this would be a possibility and it can be very difficult to predict which patient this would occur; discussed with him that his stroke risk for an asymptomatic carotid lesion is about 5% per year. At that point the pt wished to defer carotid endarterectomy because he did not want to risk possibly being on dialysis after a cardiac cath. The patient was to return for follow-up carotid duplex scan in 6 months time. He'll return sooner if he  develops any symptoms of TIA amaurosis or stroke.  His creatinine on 11/05/14 was 2.06 (review of records).  The patient denies New Medical or Surgical History.  Pt Diabetic: yes, pt states last A1C was 6.0 Pt smoker: former smoker, quit about 1972  Pt meds include: Statin : yes ASA: yes Other anticoagulants/antiplatelets: Plavix   Past Medical History  Diagnosis Date  . Coronary artery disease     quiescent on medical therapy,abnormal exercise Cardiolite suggestive of apical ischemia;EF 54%,02/10  . Hyperlipidemia     abnormal LFT's,resolved,secondary to lipitor  . Chronic kidney disease     chronic renal insufficiency,single functional kidney  . Tobacco abuse   . 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 Hypertension   . Shortness of breath dyspnea     with exertion  . Diabetes mellitus without complication (HCC)     Type 2  . Arthritis   . Gout     Social History Social History  Substance Use Topics  . Smoking status: Former Smoker -- 1.00 packs/day for 25 years    Types: Cigarettes    Start date: 01/04/1955    Quit date: 01/03/1970  . Smokeless tobacco: Never Used  . Alcohol Use: No    Family History Family History  Problem Relation Age of Onset  . Liver disease Mother   . Gallbladder disease Father   . Heart attack Sister   . Bone cancer Sister     Surgical History Past Surgical History  Procedure Laterality Date  . Bladder surgery  1980  . Penectomy      surgery for carcinoma of penis  . 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 amLODipine (NORVASC) 5 MG tablet Take 1 tablet by mouth daily.    Marland Kitchen amoxicillin (AMOXIL) 500 MG capsule Take 1 capsule by mouth as needed.    Marland Kitchen aspirin EC 81 MG tablet Take 81 mg by mouth daily.      . Calcium Carbonate Antacid (ALKA-SELTZER ANTACID) 500 MG CAPS Take 1 capsule by mouth as  needed.      . Cholecalciferol (VITAMIN D3) 2000 UNITS TABS Take 1 tablet by mouth daily.     Marland Kitchen CINNAMON PO Take 2 tablets by mouth daily.    . clonazePAM (KLONOPIN) 1 MG tablet Take 1 tablet by mouth daily.    . clopidogrel (PLAVIX) 75 MG tablet Take 1 tablet (75 mg total) by mouth daily. 30 tablet 6  . clotrimazole-betamethasone (LOTRISONE) cream Apply 1 application topically as needed.    . Coenzyme Q10 (COQ-10) 100 MG CAPS Take 1 capsule by mouth daily.    . colchicine 0.6 MG tablet Take 0.6 mg by mouth daily as needed.     Marland Kitchen glimepiride (AMARYL) 2 MG tablet Take 2 mg by mouth 2 (two) times daily.      . isosorbide mononitrate (IMDUR) 30 MG 24 hr tablet Take 30 mg by mouth daily.     . Lactobacillus (ACIDOPHILUS) CAPS Take 1 capsule by mouth daily as needed.     Marland Kitchen losartan (COZAAR) 25 MG tablet Take 25 mg by mouth at bedtime.     . lovastatin (MEVACOR) 20 MG tablet TAKE ONE TABLET BY MOUTH AT BEDTIME 90 tablet 3  . metroNIDAZOLE (METROGEL) 1 % gel Apply 1 application topically daily.      . nitroGLYCERIN (NITROSTAT) 0.4 MG SL tablet Place 1 tablet (0.4 mg total) under the tongue every 5 (five) minutes as needed. 25 tablet 3  . Omega-3 Fatty Acids (FISH OIL) 1200 MG CAPS Take 1 capsule by mouth daily.      Marland Kitchen Phenylephrine-Acetaminophen (RA SUPHEDRINE PE SINUS) 5-325 MG TABS Take 1 tablet by mouth as needed.      . sodium polystyrene (KAYEXALATE) 15 GM/60ML suspension Take 30 g by mouth every 30 (thirty) days.     . vitamin C (ASCORBIC ACID) 500 MG tablet Take 500 mg by mouth daily.     No current facility-administered medications for this visit.    Review of Systems : See HPI for pertinent positives and negatives.  Physical Examination  Filed Vitals:   12/11/14 1127 12/11/14 1130  BP: 124/69 120/68  Pulse: 80 68  Temp: 98.2 F (36.8 C)   TempSrc: Oral   Resp: 16   Height: 5\' 11"  (1.803 m)   Weight: 220 lb (99.791 kg)   SpO2:  98%   Body mass index is 30.7  kg/(m^2).  General: WDWN male in NAD GAIT: normal Eyes: PERRLA Pulmonary:  Non-labored, CTAB, no  Rales, no rhonchi, & no wheezing.  Cardiac: regular rhythm, no detected murmur.  VASCULAR EXAM Carotid Bruits Right Left   Negative Negative    Aorta is not palpable. Radial pulses are 2+ palpable and equal.  LE Pulses Right Left       POPLITEAL  not palpable   not palpable       POSTERIOR TIBIAL  faintly palpable   faintly palpable        DORSALIS PEDIS      ANTERIOR TIBIAL not palpable  not palpable     Gastrointestinal: soft, nontender, BS WNL, no r/g,  no palpable masses.  Musculoskeletal: No muscle atrophy/wasting. M/S 5/5 throughout, extremities without ischemic changes.  Neurologic: A&O X 3; Appropriate Affect, Speech is normal CN 2-12 intact except is slightly hard of hearing, pain and light touch intact in extremities, Motor exam as listed above.   Non-Invasive Vascular Imaging CAROTID DUPLEX 12/11/2014   Right ICA: confirmed 80 - 99 % stenosis. Left ICA: 1 - 39 % stenosis. No significant change when compared to 05/15/14 exam.   Assessment: [name] [name] is a 74 y.o. male who has no history of stroke or TIA.  Carotid duplex today confirms 80-99% right ICA stenosis and 1-39% left ICA stenosis, unchanged in 6 months. Recent noninvasive cardiac testing showed reversible ischemia. His risk of myocardial infarction perioperatively for carotid endarterectomy was considered to be elevated. The patient is reluctant to have a cardiac catheterization secondary to his renal dysfunction.  His creatinine on 11/05/14 was 2.06 (review of records).  Dr. Oneida Alar spoke with pt. The pt's atherosclerotic risk factors seems to be well controlled, including an A1C of 6.0 and no tobacco use since 1972. He remains physically active.  He takes daily Plavix and ASA.     Plan: Follow-up in 6 months with Carotid Duplex scan.   I discussed in depth with the patient the nature of atherosclerosis, and emphasized the importance of maximal medical management including strict control of blood pressure, blood glucose, and lipid levels, obtaining regular exercise, and continued cessation of smoking.  The patient is aware that without maximal medical management the underlying atherosclerotic disease process will progress, limiting the benefit of any interventions. The patient was given information about stroke prevention and what symptoms should prompt the patient to seek immediate medical care. Thank you for allowing [place] to participate in this patient's care.  Clemon Chambers, RN, MSN, FNP-C [facility] Office: [phone]  Clinic Physician: Oneida Alar  12/11/2014 11:46 AM

## 2014-12-11 NOTE — Patient Instructions (Signed)
Stroke Prevention Some medical conditions and behaviors are associated with an increased chance of having a stroke. You may prevent a stroke by making healthy choices and managing medical conditions. HOW CAN I REDUCE MY RISK OF HAVING A STROKE?   Stay physically active. Get at least 30 minutes of activity on most or all days.  Do not smoke. It may also be helpful to avoid exposure to secondhand smoke.  Limit alcohol use. Moderate alcohol use is considered to be:  No more than 2 drinks per day for men.  No more than 1 drink per day for nonpregnant women.  Eat healthy foods. This involves:  Eating 5 or more servings of fruits and vegetables a day.  Making dietary changes that address high blood pressure (hypertension), high cholesterol, diabetes, or obesity.  Manage your cholesterol levels.  Making food choices that are high in fiber and low in saturated fat, trans fat, and cholesterol may control cholesterol levels.  Take any prescribed medicines to control cholesterol as directed by your health care provider.  Manage your diabetes.  Controlling your carbohydrate and sugar intake is recommended to manage diabetes.  Take any prescribed medicines to control diabetes as directed by your health care provider.  Control your hypertension.  Making food choices that are low in salt (sodium), saturated fat, trans fat, and cholesterol is recommended to manage hypertension.  Ask your health care provider if you need treatment to lower your blood pressure. Take any prescribed medicines to control hypertension as directed by your health care provider.  If you are 18-39 years of age, have your blood pressure checked every 3-5 years. If you are 40 years of age or older, have your blood pressure checked every year.  Maintain a healthy weight.  Reducing calorie intake and making food choices that are low in sodium, saturated fat, trans fat, and cholesterol are recommended to manage  weight.  Stop drug abuse.  Avoid taking birth control pills.  Talk to your health care provider about the risks of taking birth control pills if you are over 35 years old, smoke, get migraines, or have ever had a blood clot.  Get evaluated for sleep disorders (sleep apnea).  Talk to your health care provider about getting a sleep evaluation if you snore a lot or have excessive sleepiness.  Take medicines only as directed by your health care provider.  For some people, aspirin or blood thinners (anticoagulants) are helpful in reducing the risk of forming abnormal blood clots that can lead to stroke. If you have the irregular heart rhythm of atrial fibrillation, you should be on a blood thinner unless there is a good reason you cannot take them.  Understand all your medicine instructions.  Make sure that other conditions (such as anemia or atherosclerosis) are addressed. SEEK IMMEDIATE MEDICAL CARE IF:   You have sudden weakness or numbness of the face, arm, or leg, especially on one side of the body.  Your face or eyelid droops to one side.  You have sudden confusion.  You have trouble speaking (aphasia) or understanding.  You have sudden trouble seeing in one or both eyes.  You have sudden trouble walking.  You have dizziness.  You have a loss of balance or coordination.  You have a sudden, severe headache with no known cause.  You have new chest pain or an irregular heartbeat. Any of these symptoms may represent a serious problem that is an emergency. Do not wait to see if the symptoms will   go away. Get medical help at once. Call your local emergency services (911 in [place] Do not drive yourself to the hospital.   This information is not intended to replace advice given to you by your health care provider. Make sure you discuss any questions you have with your health care provider.   Document Released: 01/28/2004 Document Revised: 01/10/2014 Document Reviewed:  06/22/2012 Elsevier Interactive Patient Education 2016 Elsevier Inc.  

## 2014-12-12 NOTE — Addendum Note (Signed)
Addended by: Dorthula Rue L on: 12/12/2014 12:05 PM   Modules accepted: Orders

## 2015-01-07 DIAGNOSIS — N184 Chronic kidney disease, stage 4 (severe): Secondary | ICD-10-CM | POA: Diagnosis not present

## 2015-01-07 DIAGNOSIS — E1129 Type 2 diabetes mellitus with other diabetic kidney complication: Secondary | ICD-10-CM | POA: Diagnosis not present

## 2015-01-07 DIAGNOSIS — I10 Essential (primary) hypertension: Secondary | ICD-10-CM | POA: Diagnosis not present

## 2015-01-07 DIAGNOSIS — E875 Hyperkalemia: Secondary | ICD-10-CM | POA: Diagnosis not present

## 2015-01-31 DIAGNOSIS — R69 Illness, unspecified: Secondary | ICD-10-CM | POA: Diagnosis not present

## 2015-02-11 DIAGNOSIS — Z6832 Body mass index (BMI) 32.0-32.9, adult: Secondary | ICD-10-CM | POA: Diagnosis not present

## 2015-02-11 DIAGNOSIS — I2510 Atherosclerotic heart disease of native coronary artery without angina pectoris: Secondary | ICD-10-CM | POA: Diagnosis not present

## 2015-02-11 DIAGNOSIS — I10 Essential (primary) hypertension: Secondary | ICD-10-CM | POA: Diagnosis not present

## 2015-02-11 DIAGNOSIS — E1142 Type 2 diabetes mellitus with diabetic polyneuropathy: Secondary | ICD-10-CM | POA: Diagnosis not present

## 2015-02-11 DIAGNOSIS — Z87891 Personal history of nicotine dependence: Secondary | ICD-10-CM | POA: Diagnosis not present

## 2015-02-18 ENCOUNTER — Ambulatory Visit (INDEPENDENT_AMBULATORY_CARE_PROVIDER_SITE_OTHER): Payer: Medicare HMO | Admitting: Cardiovascular Disease

## 2015-02-18 ENCOUNTER — Encounter: Payer: Self-pay | Admitting: Cardiovascular Disease

## 2015-02-18 VITALS — BP 124/75 | HR 79 | Ht 70.0 in | Wt 215.0 lb

## 2015-02-18 DIAGNOSIS — R931 Abnormal findings on diagnostic imaging of heart and coronary circulation: Secondary | ICD-10-CM

## 2015-02-18 DIAGNOSIS — E785 Hyperlipidemia, unspecified: Secondary | ICD-10-CM | POA: Diagnosis not present

## 2015-02-18 DIAGNOSIS — I10 Essential (primary) hypertension: Secondary | ICD-10-CM

## 2015-02-18 DIAGNOSIS — I6521 Occlusion and stenosis of right carotid artery: Secondary | ICD-10-CM

## 2015-02-18 NOTE — Progress Notes (Signed)
Patient ID: [name] [name], male   DOB: 1940-11-11, 75 y.o.   MRN: [id number]      SUBJECTIVE: The patient presents for routine cardiovascular follow-up. He has asymptomatic high-grade right internal carotid artery stenosis and has been unwilling to undergo coronary angiography for fear of acute on chronic renal insufficiency. He is on dual antiplatelet therapy with aspirin and Plavix. He has probable coronary artery disease.  Nuclear stress test dated 05/23/14 demonstrated a moderate-sized inferior wall infarct with moderate peri-infarct ischemia, as well as a completely reversible moderate-sized region of apical ischemia, and deemed a high risk stress test.  He denies chest pain, palpitations, leg swelling, and shortness of breath. He is able to carry on his activities of daily living.  He understands Dr. Oneida Alar would prefer to have him undergo coronary angiography to define his coronary anatomy prior to carotid endarterectomy. He is now willing to proceed.  Review of Systems: As per "subjective", otherwise negative.  No Known Allergies  Current Outpatient Prescriptions  Medication Sig Dispense Refill  . amLODipine (NORVASC) 5 MG tablet Take 1 tablet by mouth daily.    Marland Kitchen amoxicillin (AMOXIL) 500 MG capsule Take 1 capsule by mouth as needed.    Marland Kitchen aspirin EC 81 MG tablet Take 81 mg by mouth daily.      . Calcium Carbonate Antacid (ALKA-SELTZER ANTACID) 500 MG CAPS Take 1 capsule by mouth as needed.      . Cholecalciferol (VITAMIN D3) 2000 UNITS TABS Take 1 tablet by mouth daily.     Marland Kitchen CINNAMON PO Take 2 tablets by mouth daily.    . clonazePAM (KLONOPIN) 1 MG tablet Take 1 tablet by mouth daily.    . clopidogrel (PLAVIX) 75 MG tablet Take 1 tablet (75 mg total) by mouth daily. 30 tablet 6  . clotrimazole-betamethasone (LOTRISONE) cream Apply 1 application topically as needed.    . Coenzyme Q10 (COQ-10) 100 MG CAPS Take 1 capsule by mouth daily.    . colchicine 0.6 MG tablet Take 0.6 mg by  mouth daily as needed.     Marland Kitchen glimepiride (AMARYL) 2 MG tablet Take 2 mg by mouth 2 (two) times daily.      . isosorbide mononitrate (IMDUR) 30 MG 24 hr tablet Take 30 mg by mouth daily.     . Lactobacillus (ACIDOPHILUS) CAPS Take 1 capsule by mouth daily as needed.     Marland Kitchen losartan (COZAAR) 25 MG tablet Take 25 mg by mouth at bedtime.     . lovastatin (MEVACOR) 20 MG tablet TAKE ONE TABLET BY MOUTH AT BEDTIME 90 tablet 3  . metroNIDAZOLE (METROGEL) 1 % gel Apply 1 application topically daily.      . nitroGLYCERIN (NITROSTAT) 0.4 MG SL tablet Place 1 tablet (0.4 mg total) under the tongue every 5 (five) minutes as needed. 25 tablet 3  . Omega-3 Fatty Acids (FISH OIL) 1200 MG CAPS Take 1 capsule by mouth daily.      Marland Kitchen Phenylephrine-Acetaminophen (RA SUPHEDRINE PE SINUS) 5-325 MG TABS Take 1 tablet by mouth as needed.      . sodium polystyrene (KAYEXALATE) 15 GM/60ML suspension Take 30 g by mouth every 30 (thirty) days.     . vitamin C (ASCORBIC ACID) 500 MG tablet Take 500 mg by mouth daily.     No current facility-administered medications for this visit.    Past Medical History  Diagnosis Date  . Coronary artery disease     quiescent on medical therapy,abnormal exercise Cardiolite suggestive of  apical ischemia;EF 54%,02/10  . Hyperlipidemia     abnormal LFT's,resolved,secondary to lipitor  . Chronic kidney disease     chronic renal insufficiency,single functional kidney  . Tobacco abuse   . 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 Hypertension   . Shortness of breath dyspnea     with exertion  . Diabetes mellitus without complication (HCC)     Type 2  . Arthritis   . Gout     Past Surgical History  Procedure Laterality Date  . Bladder surgery  1980  . Penectomy      surgery for carcinoma of penis  . 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 Marital Status: Widowed    Spouse Name: N/A  . Number of  Children: N/A  . Years of Education: N/A   Occupational History  . Not on file.   Social History Main Topics  . Smoking status: Former Smoker -- 1.00 packs/day for 25 years    Types: Cigarettes    Start date: 01/04/1955    Quit date: 01/03/1970  . Smokeless tobacco: Never Used  . Alcohol Use: No  . Drug Use: No  . Sexual Activity: Not on file   Other Topics Concern  . Not on file   Social History Narrative     Filed Vitals:   02/18/15 1105  BP: 124/75  Pulse: 79  Height: 5\' 10"  (1.778 m)  Weight: 215 lb (97.523 kg)    PHYSICAL EXAM General: NAD HEENT: Normal. Neck: No JVD, no thyromegaly. Lungs: Clear to auscultation bilaterally with normal respiratory effort. CV: Nondisplaced PMI.  Regular rate and rhythm, normal S1/S2, no S3/S4, no murmur. No pretibial or periankle edema.  No carotid bruit.   Abdomen: Soft, nontender, no distention.  Neurologic: Alert and oriented.  Psych: Normal affect. Skin: Normal. Musculoskeletal: No gross deformities.  ECG: Most recent ECG reviewed.      ASSESSMENT AND PLAN: 1. CAD/high risk stress test: Symptomatically stable. Will arrange for coronary angiography. Continue medical therapy with ASA, Imdur, Plavix, and statin.   2. Essential HTN: Well controlled. No changes.  3. Hyperlipidemia: Currently on low-intensity statin therapy with lovastatin 20 mg. No changes.  4. High-grade asymptomatic right internal carotid artery stenosis: On ASA, Plavix, and statin, and follows with vascular. After coronary angiography, CEA can be planned.  Dispo: f/u after cath.  Kate Sable, M.D., F.A.C.C.

## 2015-02-19 ENCOUNTER — Encounter: Payer: Self-pay | Admitting: *Deleted

## 2015-02-20 ENCOUNTER — Other Ambulatory Visit: Payer: Self-pay | Admitting: Cardiovascular Disease

## 2015-02-20 DIAGNOSIS — R9439 Abnormal result of other cardiovascular function study: Secondary | ICD-10-CM

## 2015-02-24 ENCOUNTER — Telehealth: Payer: Self-pay | Admitting: Cardiovascular Disease

## 2015-02-24 NOTE — Telephone Encounter (Signed)
Heart cath scheduled for Monday, 03/02/15 - 7:30 - Dr. Julianne Handler.

## 2015-03-02 ENCOUNTER — Other Ambulatory Visit: Payer: Self-pay | Admitting: *Deleted

## 2015-03-02 ENCOUNTER — Ambulatory Visit (HOSPITAL_COMMUNITY)
Admission: RE | Admit: 2015-03-02 | Discharge: 2015-03-03 | Disposition: A | Discharge status: Home / Self Care | Payer: Medicare HMO | Source: Ambulatory Visit | Attending: Cardiovascular Disease | Admitting: Cardiovascular Disease

## 2015-03-02 ENCOUNTER — Ambulatory Visit (HOSPITAL_BASED_OUTPATIENT_CLINIC_OR_DEPARTMENT_OTHER): Payer: Medicare HMO

## 2015-03-02 ENCOUNTER — Encounter (HOSPITAL_COMMUNITY)
Admission: RE | Disposition: A | Discharge status: Home / Self Care | Payer: Self-pay | Source: Ambulatory Visit | Attending: Cardiovascular Disease

## 2015-03-02 ENCOUNTER — Encounter (HOSPITAL_COMMUNITY): Payer: Medicare HMO

## 2015-03-02 ENCOUNTER — Encounter (HOSPITAL_COMMUNITY): Payer: Self-pay | Admitting: Cardiothoracic Surgery

## 2015-03-02 DIAGNOSIS — I2510 Atherosclerotic heart disease of native coronary artery without angina pectoris: Secondary | ICD-10-CM

## 2015-03-02 DIAGNOSIS — E1122 Type 2 diabetes mellitus with diabetic chronic kidney disease: Secondary | ICD-10-CM | POA: Insufficient documentation

## 2015-03-02 DIAGNOSIS — Z7982 Long term (current) use of aspirin: Secondary | ICD-10-CM | POA: Insufficient documentation

## 2015-03-02 DIAGNOSIS — I129 Hypertensive chronic kidney disease with stage 1 through stage 4 chronic kidney disease, or unspecified chronic kidney disease: Secondary | ICD-10-CM | POA: Insufficient documentation

## 2015-03-02 DIAGNOSIS — R9439 Abnormal result of other cardiovascular function study: Secondary | ICD-10-CM | POA: Diagnosis present

## 2015-03-02 DIAGNOSIS — N182 Chronic kidney disease, stage 2 (mild): Secondary | ICD-10-CM | POA: Diagnosis not present

## 2015-03-02 DIAGNOSIS — Z87891 Personal history of nicotine dependence: Secondary | ICD-10-CM | POA: Insufficient documentation

## 2015-03-02 DIAGNOSIS — N189 Chronic kidney disease, unspecified: Secondary | ICD-10-CM | POA: Diagnosis not present

## 2015-03-02 DIAGNOSIS — E7800 Pure hypercholesterolemia, unspecified: Secondary | ICD-10-CM | POA: Insufficient documentation

## 2015-03-02 DIAGNOSIS — Z7902 Long term (current) use of antithrombotics/antiplatelets: Secondary | ICD-10-CM | POA: Diagnosis not present

## 2015-03-02 DIAGNOSIS — I6529 Occlusion and stenosis of unspecified carotid artery: Secondary | ICD-10-CM | POA: Diagnosis present

## 2015-03-02 DIAGNOSIS — Z8549 Personal history of malignant neoplasm of other male genital organs: Secondary | ICD-10-CM | POA: Diagnosis not present

## 2015-03-02 DIAGNOSIS — I10 Essential (primary) hypertension: Secondary | ICD-10-CM | POA: Diagnosis present

## 2015-03-02 DIAGNOSIS — R931 Abnormal findings on diagnostic imaging of heart and coronary circulation: Secondary | ICD-10-CM

## 2015-03-02 DIAGNOSIS — I6521 Occlusion and stenosis of right carotid artery: Secondary | ICD-10-CM | POA: Diagnosis not present

## 2015-03-02 DIAGNOSIS — E785 Hyperlipidemia, unspecified: Secondary | ICD-10-CM | POA: Insufficient documentation

## 2015-03-02 HISTORY — DX: Abnormal result of other cardiovascular function study: R94.39

## 2015-03-02 HISTORY — PX: CARDIAC CATHETERIZATION: SHX172

## 2015-03-02 LAB — BASIC METABOLIC PANEL
ANION GAP: 11 (ref 5–15)
BUN: 32 mg/dL — AB (ref 6–20)
CHLORIDE: 110 mmol/L (ref 101–111)
CO2: 22 mmol/L (ref 22–32)
Calcium: 9.2 mg/dL (ref 8.9–10.3)
Creatinine, Ser: 2.15 mg/dL — ABNORMAL HIGH (ref 0.61–1.24)
GFR calc Af Amer: 33 mL/min — ABNORMAL LOW (ref >60)
GFR calc non Af Amer: 28 mL/min — ABNORMAL LOW (ref >60)
Glucose, Bld: 181 mg/dL — ABNORMAL HIGH (ref 65–99)
POTASSIUM: 4.5 mmol/L (ref 3.5–5.1)
SODIUM: 143 mmol/L (ref 135–145)

## 2015-03-02 LAB — URINALYSIS, ROUTINE W REFLEX MICROSCOPIC
Bilirubin Urine: NEGATIVE
Glucose, UA: NEGATIVE mg/dL
Hgb urine dipstick: NEGATIVE
Ketones, ur: NEGATIVE mg/dL
Leukocytes, UA: NEGATIVE
Nitrite: NEGATIVE
Protein, ur: NEGATIVE mg/dL
Specific Gravity, Urine: 1.016 (ref 1.005–1.030)
pH: 6.5 (ref 5.0–8.0)

## 2015-03-02 LAB — CBC
HEMATOCRIT: 38.2 % — AB (ref 39.0–52.0)
HEMOGLOBIN: 12.3 g/dL — AB (ref 13.0–17.0)
MCH: 30.5 pg (ref 26.0–34.0)
MCHC: 32.2 g/dL (ref 30.0–36.0)
MCV: 94.8 fL (ref 78.0–100.0)
Platelets: 320 10*3/uL (ref 150–400)
RBC: 4.03 MIL/uL — AB (ref 4.22–5.81)
RDW: 12.6 % (ref 11.5–15.5)
WBC: 9.1 10*3/uL (ref 4.0–10.5)

## 2015-03-02 LAB — GLUCOSE, CAPILLARY
GLUCOSE-CAPILLARY: 83 mg/dL (ref 65–99)
Glucose-Capillary: 162 mg/dL — ABNORMAL HIGH (ref 65–99)
Glucose-Capillary: 129 mg/dL — ABNORMAL HIGH (ref 65–99)

## 2015-03-02 LAB — SURGICAL PCR SCREEN
MRSA, PCR: NEGATIVE
Staphylococcus aureus: NEGATIVE

## 2015-03-02 LAB — PROTIME-INR
INR: 1.07 (ref 0.00–1.49)
PROTHROMBIN TIME: 14.1 s (ref 11.6–15.2)

## 2015-03-02 LAB — POCT ACTIVATED CLOTTING TIME: ACTIVATED CLOTTING TIME: 296 s

## 2015-03-02 SURGERY — LEFT HEART CATH AND CORONARY ANGIOGRAPHY
Anesthesia: LOCAL

## 2015-03-02 MED ORDER — SODIUM CHLORIDE 0.9% FLUSH: 3.0000 mL | Freq: Two times a day (BID) | INTRAVENOUS | Status: DC

## 2015-03-02 MED ORDER — LOSARTAN POTASSIUM 25 MG PO TABS
25.0000 mg | ORAL_TABLET | Freq: Every day | ORAL | Status: DC
  Administered 2015-03-02: 25 mg via ORAL
  Filled 2015-03-02: qty 1

## 2015-03-02 MED ORDER — SODIUM CHLORIDE 0.9 % IV SOLN: 250.0000 mL | INTRAVENOUS | Status: DC | PRN

## 2015-03-02 MED ORDER — SODIUM CHLORIDE 0.9% FLUSH
3.0000 mL | Freq: Two times a day (BID) | INTRAVENOUS | Status: DC
Start: 2015-03-02 — End: 2015-03-03

## 2015-03-02 MED ORDER — SODIUM CHLORIDE 0.9 % IV SOLN
INTRAVENOUS | Status: AC
  Administered 2015-03-02: 50 mL/h via INTRAVENOUS

## 2015-03-02 MED ORDER — HEPARIN SODIUM (PORCINE) 1000 UNIT/ML IJ SOLN
INTRAMUSCULAR | Status: DC | PRN
  Administered 2015-03-02: 5000 [IU] via INTRAVENOUS
  Administered 2015-03-02: 3000 [IU] via INTRAVENOUS
  Administered 2015-03-02: 5000 [IU] via INTRAVENOUS

## 2015-03-02 MED ORDER — HEPARIN SODIUM (PORCINE) 1000 UNIT/ML IJ SOLN
INTRAMUSCULAR | Status: AC
Start: 2015-03-02 — End: 2015-03-02
  Filled 2015-03-02: qty 1

## 2015-03-02 MED ORDER — ASPIRIN EC 81 MG PO TBEC
81.0000 mg | DELAYED_RELEASE_TABLET | Freq: Every day | ORAL | Status: DC
  Administered 2015-03-03: 81 mg via ORAL
  Filled 2015-03-02: qty 1

## 2015-03-02 MED ORDER — HEPARIN (PORCINE) IN NACL 2-0.9 UNIT/ML-% IJ SOLN
INTRAMUSCULAR | Status: DC | PRN
  Administered 2015-03-02: 1000 mL

## 2015-03-02 MED ORDER — SODIUM CHLORIDE 0.9 % IV SOLN: INTRAVENOUS | Status: DC

## 2015-03-02 MED ORDER — SODIUM CHLORIDE 0.9% FLUSH: 3.0000 mL | INTRAVENOUS | Status: DC | PRN

## 2015-03-02 MED ORDER — FENTANYL CITRATE (PF) 100 MCG/2ML IJ SOLN
INTRAMUSCULAR | Status: AC
  Filled 2015-03-02: qty 2

## 2015-03-02 MED ORDER — ATORVASTATIN CALCIUM 40 MG PO TABS
40.0000 mg | ORAL_TABLET | Freq: Every day | ORAL | Status: DC
  Administered 2015-03-02: 40 mg via ORAL
  Filled 2015-03-02: qty 1

## 2015-03-02 MED ORDER — VERAPAMIL HCL 2.5 MG/ML IV SOLN
INTRAVENOUS | Status: DC | PRN
  Administered 2015-03-02: 08:00:00 via INTRA_ARTERIAL

## 2015-03-02 MED ORDER — LIDOCAINE HCL (PF) 1 % IJ SOLN
INTRAMUSCULAR | Status: AC
  Filled 2015-03-02: qty 30

## 2015-03-02 MED ORDER — CLONAZEPAM 0.5 MG PO TABS
1.0000 mg | ORAL_TABLET | Freq: Every day | ORAL | Status: DC
  Administered 2015-03-02: 1 mg via ORAL
  Filled 2015-03-02: qty 2

## 2015-03-02 MED ORDER — MIDAZOLAM HCL 2 MG/2ML IJ SOLN
INTRAMUSCULAR | Status: AC
  Filled 2015-03-02: qty 2

## 2015-03-02 MED ORDER — GLIMEPIRIDE 1 MG PO TABS
2.0000 mg | ORAL_TABLET | Freq: Two times a day (BID) | ORAL | Status: DC
  Administered 2015-03-02 – 2015-03-03 (×2): 2 mg via ORAL
  Filled 2015-03-02 (×2): qty 2

## 2015-03-02 MED ORDER — SODIUM CHLORIDE 0.9 % WEIGHT BASED INFUSION
1.0000 mL/kg/h | INTRAVENOUS | Status: DC
  Administered 2015-03-02: 1 mL/kg/h via INTRAVENOUS

## 2015-03-02 MED ORDER — VERAPAMIL HCL 2.5 MG/ML IV SOLN
INTRAVENOUS | Status: AC
  Filled 2015-03-02: qty 2

## 2015-03-02 MED ORDER — AMLODIPINE BESYLATE 5 MG PO TABS
5.0000 mg | ORAL_TABLET | Freq: Every day | ORAL | Status: DC
  Administered 2015-03-03: 5 mg via ORAL
  Filled 2015-03-02: qty 1

## 2015-03-02 MED ORDER — ACETAMINOPHEN 325 MG PO TABS: 650.0000 mg | ORAL_TABLET | ORAL | Status: DC | PRN

## 2015-03-02 MED ORDER — MIDAZOLAM HCL 2 MG/2ML IJ SOLN
INTRAMUSCULAR | Status: DC | PRN
  Administered 2015-03-02 (×2): 1 mg via INTRAVENOUS

## 2015-03-02 MED ORDER — HEPARIN SODIUM (PORCINE) 1000 UNIT/ML IJ SOLN
INTRAMUSCULAR | Status: AC
  Filled 2015-03-02: qty 1

## 2015-03-02 MED ORDER — ISOSORBIDE MONONITRATE ER 30 MG PO TB24
30.0000 mg | ORAL_TABLET | Freq: Every day | ORAL | Status: DC
  Administered 2015-03-03: 30 mg via ORAL
  Filled 2015-03-02 (×2): qty 1

## 2015-03-02 MED ORDER — HEPARIN (PORCINE) IN NACL 2-0.9 UNIT/ML-% IJ SOLN
INTRAMUSCULAR | Status: AC
  Filled 2015-03-02: qty 1000

## 2015-03-02 MED ORDER — ASPIRIN 81 MG PO CHEW
81.0000 mg | CHEWABLE_TABLET | ORAL | Status: AC
  Administered 2015-03-02: 81 mg via ORAL

## 2015-03-02 MED ORDER — CLOPIDOGREL BISULFATE 75 MG PO TABS
75.0000 mg | ORAL_TABLET | Freq: Once | ORAL | Status: AC
  Administered 2015-03-02: 75 mg via ORAL

## 2015-03-02 MED ORDER — LIDOCAINE HCL (PF) 1 % IJ SOLN
INTRAMUSCULAR | Status: DC | PRN
  Administered 2015-03-02: 2 mL

## 2015-03-02 MED ORDER — CLOPIDOGREL BISULFATE 75 MG PO TABS
ORAL_TABLET | ORAL | Status: AC
  Administered 2015-03-02: 75 mg via ORAL
  Filled 2015-03-02: qty 1

## 2015-03-02 MED ORDER — NITROGLYCERIN 0.4 MG SL SUBL: 0.4000 mg | SUBLINGUAL_TABLET | SUBLINGUAL | Status: DC | PRN

## 2015-03-02 MED ORDER — VITAMIN D 1000 UNITS PO TABS
2000.0000 [IU] | ORAL_TABLET | Freq: Every day | ORAL | Status: DC
  Administered 2015-03-03: 2000 [IU] via ORAL
  Filled 2015-03-02 (×2): qty 2

## 2015-03-02 MED ORDER — ONDANSETRON HCL 4 MG/2ML IJ SOLN: 4.0000 mg | Freq: Four times a day (QID) | INTRAMUSCULAR | Status: DC | PRN

## 2015-03-02 MED ORDER — ASPIRIN 81 MG PO CHEW
CHEWABLE_TABLET | ORAL | Status: AC
  Administered 2015-03-02: 81 mg via ORAL
  Filled 2015-03-02: qty 1

## 2015-03-02 MED ORDER — SODIUM CHLORIDE 0.9 % WEIGHT BASED INFUSION
3.0000 mL/kg/h | INTRAVENOUS | Status: DC
  Administered 2015-03-02: 3 mL/kg/h via INTRAVENOUS

## 2015-03-02 MED ORDER — IOHEXOL 350 MG/ML SOLN
INTRAVENOUS | Status: DC | PRN
  Administered 2015-03-02: 120 mL via INTRAVENOUS

## 2015-03-02 MED ORDER — COQ-10 100 MG PO CAPS: 1.0000 | ORAL_CAPSULE | Freq: Every day | ORAL | Status: DC

## 2015-03-02 MED ORDER — FENTANYL CITRATE (PF) 100 MCG/2ML IJ SOLN
INTRAMUSCULAR | Status: DC | PRN
  Administered 2015-03-02 (×2): 25 ug via INTRAVENOUS

## 2015-03-02 SURGICAL SUPPLY — 15 items
CATH INFINITI 5 FR JL3.5 (CATHETERS) ×2 IMPLANT
CATH INFINITI 5FR ANG PIGTAIL (CATHETERS) ×2 IMPLANT
CATH INFINITI JR4 5F (CATHETERS) ×2 IMPLANT
CATH OPTICROSS 40MHZ (CATHETERS) ×2 IMPLANT
CATH VISTA GUIDE 6FR XBLAD3.5 (CATHETERS) ×2 IMPLANT
DEVICE RAD COMP TR BAND LRG (VASCULAR PRODUCTS) ×2 IMPLANT
GLIDESHEATH SLEND SS 6F .021 (SHEATH) ×2 IMPLANT
KIT ESSENTIALS PG (KITS) ×2 IMPLANT
KIT HEART LEFT (KITS) ×2 IMPLANT
PACK CARDIAC CATHETERIZATION (CUSTOM PROCEDURE TRAY) ×2 IMPLANT
SLED PULL BACK IVUS (MISCELLANEOUS) ×2 IMPLANT
TRANSDUCER W/STOPCOCK (MISCELLANEOUS) ×2 IMPLANT
TUBING CIL FLEX 10 FLL-RA (TUBING) ×2 IMPLANT
WIRE COUGAR XT STRL 190CM (WIRE) ×2 IMPLANT
WIRE SAFE-T 1.5MM-J .035X260CM (WIRE) ×2 IMPLANT

## 2015-03-02 NOTE — Progress Notes (Signed)
PHARMACIST - PHYSICIAN ORDER COMMUNICATION  CONCERNING: P&T Medication Policy on Herbal Medications  DESCRIPTION:  This patient's order for:  CoQ-10  has been noted.  This product(s) is classified as an "herbal" or natural product. Due to a lack of definitive safety studies or FDA approval, nonstandard manufacturing practices, plus the potential risk of unknown drug-drug interactions while on inpatient medications, the Pharmacy and Therapeutics Committee does not permit the use of "herbal" or natural products of this type within [facility].   ACTION TAKEN: The pharmacy department is unable to verify this order at this time.  Please reevaluate patient's clinical condition at discharge and address if the herbal or natural product(s) should be resumed at that time.   Kelvin Cellar, RPh  Pager: [phone] 03/02/2015 2:24 PM

## 2015-03-02 NOTE — Interval H&P Note (Signed)
History and Physical Interval Note:  03/02/2015 7:31 AM  [name] [name]  has presented today for cardiac cath with the diagnosis of abnormal high risk stress test. The various methods of treatment have been discussed with the patient and family. After consideration of risks, benefits and other options for treatment, the patient has consented to  Procedure(s): Left Heart Cath and Coronary Angiography (N/A) as a surgical intervention .  The patient's history has been reviewed, patient examined, no change in status, stable for surgery.  I have reviewed the patient's chart and labs.  Questions were answered to the patient's satisfaction.    Cath Lab Visit (complete for each Cath Lab visit)  Clinical Evaluation Leading to the Procedure:   ACS: No.  Non-ACS:    Anginal Classification: No Symptoms  Anti-ischemic medical therapy: Minimal Therapy (1 class of medications)  Non-Invasive Test Results: High-risk stress test findings: cardiac mortality >3%/year  Prior CABG: No previous CABG         [name] [name]

## 2015-03-02 NOTE — Consult Note (Signed)
[address]       [place] [address]             [phone]        [name] [name] Medical Record [id number] Date of Birth: 17-Feb-1940 Referring physician: Glendell Docker Primary Care: Monico Blitz, MD   Patient examined, coronary angiograms personally reviewed and discussed with patient's cardiologist. Echocardiogram is pending.  Chief Complaint:   High-risk stress test, severe left main and three-vessel CAD  History of Present Illness:     I was asked to evaluate this 75 yo Caucasian male nondiabetic nonsmoker with chronic renal insufficiency non-dialysis dependent was diagnosed with severe left main and three-vessel CAD today by cardiac catheterization via right radial artery. The patient denies any symptoms of angina or CHF. His ejection fraction on stress test was 60%. A 2-D echocardiogram is pending.  The patient was found have a high-risk stress test 6 months ago when he had a cardiology clearance for right carotid endarterectomy. The patient was found have a high-grade greater than 80% right carotid stenosis, asymptomatic but with a loud and prominent bruit. The patient was recommended to have cardiac catheterization after positive stress test but he deferred over concerns of worsening his chronic renal insufficiency with the IV contrast. The patient's carotid endarterectomy was postponed.  The patient recently reviewed the situation with his primary cardiologist and agreed at that time to be evaluated with a cardiac catheterization so that he can have a carotid endarterectomy. The patient been placed on Plavix at the time of his diagnosis of severe right carotid stenosis  Catheterization was performed via right radial artery without complication. A ventriculogram was not performed. Left ventricular end diastolic pressure was not measured.an intravascular ultrasound study of the left main determine a significant stenosis. The patient has a  chronic occluded LAD, left dominant circumflex with a 90% stenosis of the distal vessel before the posterior descending. He has graftable coronary anatomy.  The patient's renal insufficiency has been chronic.creatinine runs 2.2-2.5 It was diagnosed around age 64 and is secondary to vesical ureteral reflux. The patient underwent bladder surgery to correct the reflux. 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   Current Activity/ Functional Status: The patient is retired The patient lives alone He has minimal limitations to his functional activity and cares for his home and is independent with his ADLs   Zubrod Score: At the time of surgery this patient's most appropriate activity status/level should be described as: []     0    Normal activity, no symptoms []     1    Restricted in physical strenuous activity but ambulatory, able to do out light work [x]     2    Ambulatory and capable of self care, unable to do work activities, up and about                 more than 50%  Of the time                            []     3    Only limited self care, in bed greater than 50% of waking hours []     4    Completely disabled, no self care, confined to bed or chair []     5    Moribund  Past Medical History  Diagnosis Date  .  Coronary artery disease     quiescent on medical therapy,abnormal exercise Cardiolite suggestive of apical ischemia;EF 54%,02/10  . Hyperlipidemia     abnormal LFT's,resolved,secondary to lipitor  . Chronic kidney disease     chronic renal insufficiency,single functional kidney  . Tobacco abuse   . Penile cancer ([place])     history of penile cancer/squamous cell  . Hypertension   . Shortness of breath dyspnea     with exertion  . Diabetes mellitus without complication (HCC)     Type 2  . Arthritis   . Gout     Past Surgical History  Procedure Laterality Date  . Bladder surgery  1980  . Penectomy      surgery for carcinoma of  penis  . Squamous cell carcinoma excision Right 2014    neck     History  Smoking status  . Former Smoker -- 1.00 packs/day for 25 years  . Types: Cigarettes  . Start date: 01/04/1955  . Quit date: 01/03/1970  Smokeless tobacco  . Never Used    History  Alcohol Use No    Social History   Social History  . Marital Status: Widowed    Spouse Name: N/A  . Number of Children: N/A  . Years of Education: N/A   Occupational History  . Not on file.   Social History Main Topics  . Smoking status: Former Smoker -- 1.00 packs/day for 25 years    Types: Cigarettes    Start date: 01/04/1955    Quit date: 01/03/1970  . Smokeless tobacco: Never Used  . Alcohol Use: No  . Drug Use: No  . Sexual Activity: Not on file   Other Topics Concern  . Not on file   Social History Narrative    No Known Allergies  Current Facility-Administered Medications  Medication Dose Route Frequency Provider Last Rate Last Dose  . 0.9 %  sodium chloride infusion   Intravenous Continuous Burnell Blanks, MD 50 mL/hr at 03/02/15 1342 50 mL/hr at 03/02/15 1342    Prescriptions prior to admission  Medication Sig Dispense Refill Last Dose  . amLODipine (NORVASC) 5 MG tablet Take 1 tablet by mouth daily.   03/02/2015 at 0300  . aspirin EC 81 MG tablet Take 81 mg by mouth daily.     03/01/2015 at Unknown time  . Calcium Carbonate Antacid (ALKA-SELTZER ANTACID) 500 MG CAPS Take 1 capsule by mouth as needed.     Past Week at Unknown time  . Cholecalciferol (VITAMIN D3) 2000 UNITS TABS Take 1 tablet by mouth daily.    03/01/2015 at Unknown time  . CINNAMON PO Take 2 tablets by mouth daily.   Past Week at Unknown time  . clonazePAM (KLONOPIN) 1 MG tablet Take 1 tablet by mouth daily.   03/01/2015 at Unknown time  . clopidogrel (PLAVIX) 75 MG tablet Take 1 tablet (75 mg total) by mouth daily. 30 tablet 6 03/01/2015 at 0900  . Coenzyme Q10 (COQ-10) 100 MG CAPS Take 1 capsule by mouth daily.   03/01/2015 at  Unknown time  . glimepiride (AMARYL) 2 MG tablet Take 2 mg by mouth 2 (two) times daily.     03/01/2015 at Unknown time  . isosorbide mononitrate (IMDUR) 30 MG 24 hr tablet Take 30 mg by mouth daily.    03/01/2015 at 0900  . Lactobacillus (ACIDOPHILUS) CAPS Take 1 capsule by mouth daily as needed.    Past Week at Unknown time  . losartan (COZAAR) 25 MG  tablet Take 25 mg by mouth at bedtime.    03/01/2015 at 1800  . lovastatin (MEVACOR) 20 MG tablet TAKE ONE TABLET BY MOUTH AT BEDTIME 90 tablet 3 03/01/2015 at Unknown time  . metroNIDAZOLE (METROGEL) 1 % gel Apply 1 application topically daily.     03/02/2015 at 0300  . nitroGLYCERIN (NITROSTAT) 0.4 MG SL tablet Place 1 tablet (0.4 mg total) under the tongue every 5 (five) minutes as needed. 25 tablet 3 Taking  . Omega-3 Fatty Acids (FISH OIL) 1200 MG CAPS Take 1 capsule by mouth daily.     03/01/2015 at Unknown time  . sodium polystyrene (KAYEXALATE) 15 GM/60ML suspension Take 30 g by mouth every 30 (thirty) days.    Past Week at Unknown time  . amoxicillin (AMOXIL) 500 MG capsule Take 1 capsule by mouth as needed (sinus pressure).    More than a month at Unknown time  . clotrimazole-betamethasone (LOTRISONE) cream Apply 1 application topically as needed.   More than a month at Unknown time  . colchicine 0.6 MG tablet Take 0.6 mg by mouth daily as needed.    More than a month at Unknown time  . Phenylephrine-Acetaminophen (RA SUPHEDRINE PE SINUS) 5-325 MG TABS Take 1 tablet by mouth as needed (sinus pressure).    More than a month at Unknown time    Family History  Problem Relation Age of Onset  . Liver disease Mother   . Gallbladder disease Father   . Heart attack Sister   . Bone cancer Sister      Review of Systems:       Cardiac Review of Systems: Y or N  Chest Pain [  no  ]  Resting SOB [ no  ] Exertional SOB  [place]  ]  Orthopnea [no  ]   Pedal Edema [ no  ]    Palpitations [no  ] Syncope  [  no]   Presyncope [ no  ]  General Review of  Systems: [Y] = yes [  ]=no Constitional: recent weight change [  ]; anorexia [  ]; fatigue [  ]; nausea [  ]; night sweats [  ]; fever [  ]; or chills [  ]                                                               Dental: poor dentition[  ]; Last Dentist visit:every 6 months for cleaning   Eye : blurred vision [  ]; diplopia [   ]; vision changes [  ];  Amaurosis fugax[  ]; Resp: cough [  ];  wheezing[  ];  hemoptysis[  ]; shortness of breath[  ]; paroxysmal nocturnal dyspnea[  ]; dyspnea on exertion[yes  ]; or orthopnea[  ];  GI:  gallstones[  ], vomiting[  ];  dysphagia[  ]; melena[  ];  hematochezia [  ]; heartburn[  ];   Hx of  Colonoscopy[  ]; GU: kidney stones [  ]; hematuria[  ];   dysuria [  ];  nocturia[  ];history of bladder surgery for vesical-ureteral reflux  history of     obstruction [  ]; urinary frequency [  ]             Skin: rash, swelling[  ];,  hair loss[  ];  peripheral edema[  ];  or itching[  ]; Musculosketetal: myalgias[  ];  joint swelling[  ];  joint erythema[  ];  joint pain[  ];  back pain[  ];  Heme/Lymph: bruising[  ];  bleeding[  ];  anemia[  ];  Neuro: TIA[  ];  headaches[  ];  stroke[  ];  vertigo[  ];  seizures[  ];   paresthesias[  ];  difficulty walking[  ];  Psych:depression[  ]; anxiety[yes takes Klonopin  ];  Endocrine: diabetes[  ];  thyroid dysfunction[  ];  Immunizations: Flu [  ]; Pneumococcal[  ];  Other:right-hand dominant  Physical Exam: BP 140/75 mmHg  Pulse 74  Temp(Src) 98.5 F (36.9 C) (Oral)  Resp 18  Ht 5\' 11"  (1.803 m)  Wt 210 lb (95.255 kg)  BMI 29.30 kg/m2  SpO2 100%       Physical Exam  General: pleasant talkative 75 year old gentleman in no distress in cardiac cath Foley area HEENT: Normocephalic pupils equal , dentition adequate Neck: Supple without JVD, adenopathy, right carotid bruit present Chest: Clear to auscultation, symmetrical breath sounds, no rhonchi, no tenderness             or deformity Cardiovascular:  Regular rate and rhythm, no murmur, no gallop, peripheral pulses             palpable in all extremities Abdomen:  Soft, nontender, no palpable mass or organomegaly Extremities: Warm, well-perfused, no clubbing cyanosis edema or tenderness,              no venous stasis changes of the legs, mild spider veins left legRectal/GU: Deferred Neuro: Grossly non--focal and symmetrical throughout Skin: Clean and dry without rash or ulceration   Diagnostic Studies & Laboratory data:     Recent Radiology Findings:   No results found.   I have independently reviewed the above radiologic studies.  Recent Lab Findings: Lab Results  Component Value Date   WBC 9.1 03/02/2015   HGB 12.3* 03/02/2015   HCT 38.2* 03/02/2015   PLT 320 03/02/2015   GLUCOSE 181* 03/02/2015   ALT 17 05/27/2014   AST 18 05/27/2014   NA 143 03/02/2015   K 4.5 03/02/2015   CL 110 03/02/2015   CREATININE 2.15* 03/02/2015   BUN 32* 03/02/2015   CO2 22 03/02/2015   INR 1.07 03/02/2015      Assessment / Plan:     Severe left main and three-vessel coronary disease with preserved LV function-at risk for MI  Chronic renal insufficiency baseline creatinine 2.2-2.5  Severe right carotid stenosis pending right carotid endarterectomy by Dr. [name]  The patient would benefit from CABG to preserved LV function and improved survival. Prior to surgery he will need to be off his Plavix for week and also will need that time to allow contrast washout and check his renal function. We'll contact Dr. Oneida Alar to determine if simultaneous CABG and carotid endarterectomy are indicated .  I discussed at length the patient's situation and role of CABG for treatment of his severe CAD. I think the best plan would be to let the patient go home to allow Plavix washout and to allow renal recovery in and schedule surgery electively for next week. We will let the patient know the plan for surgery before he leaves the hospital tomorrow  after receiving IV hydration overnight.  @ME1 @ 03/02/2015 2:12 PM

## 2015-03-02 NOTE — H&P (View-Only) (Signed)
Patient ID: [name] [name], male   DOB: Apr 06, 1940, 75 y.o.   MRN: [id number]      SUBJECTIVE: The patient presents for routine cardiovascular follow-up. He has asymptomatic high-grade right internal carotid artery stenosis and has been unwilling to undergo coronary angiography for fear of acute on chronic renal insufficiency. He is on dual antiplatelet therapy with aspirin and Plavix. He has probable coronary artery disease.  Nuclear stress test dated 05/23/14 demonstrated a moderate-sized inferior wall infarct with moderate peri-infarct ischemia, as well as a completely reversible moderate-sized region of apical ischemia, and deemed a high risk stress test.  He denies chest pain, palpitations, leg swelling, and shortness of breath. He is able to carry on his activities of daily living.  He understands Dr. Oneida Alar would prefer to have him undergo coronary angiography to define his coronary anatomy prior to carotid endarterectomy. He is now willing to proceed.  Review of Systems: As per "subjective", otherwise negative.  No Known Allergies  Current Outpatient Prescriptions  Medication Sig Dispense Refill  . amLODipine (NORVASC) 5 MG tablet Take 1 tablet by mouth daily.    Marland Kitchen amoxicillin (AMOXIL) 500 MG capsule Take 1 capsule by mouth as needed.    Marland Kitchen aspirin EC 81 MG tablet Take 81 mg by mouth daily.      . Calcium Carbonate Antacid (ALKA-SELTZER ANTACID) 500 MG CAPS Take 1 capsule by mouth as needed.      . Cholecalciferol (VITAMIN D3) 2000 UNITS TABS Take 1 tablet by mouth daily.     Marland Kitchen CINNAMON PO Take 2 tablets by mouth daily.    . clonazePAM (KLONOPIN) 1 MG tablet Take 1 tablet by mouth daily.    . clopidogrel (PLAVIX) 75 MG tablet Take 1 tablet (75 mg total) by mouth daily. 30 tablet 6  . clotrimazole-betamethasone (LOTRISONE) cream Apply 1 application topically as needed.    . Coenzyme Q10 (COQ-10) 100 MG CAPS Take 1 capsule by mouth daily.    . colchicine 0.6 MG tablet Take 0.6 mg by  mouth daily as needed.     Marland Kitchen glimepiride (AMARYL) 2 MG tablet Take 2 mg by mouth 2 (two) times daily.      . isosorbide mononitrate (IMDUR) 30 MG 24 hr tablet Take 30 mg by mouth daily.     . Lactobacillus (ACIDOPHILUS) CAPS Take 1 capsule by mouth daily as needed.     Marland Kitchen losartan (COZAAR) 25 MG tablet Take 25 mg by mouth at bedtime.     . lovastatin (MEVACOR) 20 MG tablet TAKE ONE TABLET BY MOUTH AT BEDTIME 90 tablet 3  . metroNIDAZOLE (METROGEL) 1 % gel Apply 1 application topically daily.      . nitroGLYCERIN (NITROSTAT) 0.4 MG SL tablet Place 1 tablet (0.4 mg total) under the tongue every 5 (five) minutes as needed. 25 tablet 3  . Omega-3 Fatty Acids (FISH OIL) 1200 MG CAPS Take 1 capsule by mouth daily.      Marland Kitchen Phenylephrine-Acetaminophen (RA SUPHEDRINE PE SINUS) 5-325 MG TABS Take 1 tablet by mouth as needed.      . sodium polystyrene (KAYEXALATE) 15 GM/60ML suspension Take 30 g by mouth every 30 (thirty) days.     . vitamin C (ASCORBIC ACID) 500 MG tablet Take 500 mg by mouth daily.     No current facility-administered medications for this visit.    Past Medical History  Diagnosis Date  . Coronary artery disease     quiescent on medical therapy,abnormal exercise Cardiolite suggestive of  apical ischemia;EF 54%,02/10  . Hyperlipidemia     abnormal LFT's,resolved,secondary to lipitor  . Chronic kidney disease     chronic renal insufficiency,single functional kidney  . Tobacco abuse   . Penile cancer ([facility])     history of penile cancer/squamous cell  . Hypertension   . Shortness of breath dyspnea     with exertion  . Diabetes mellitus without complication (HCC)     Type 2  . Arthritis   . Gout     Past Surgical History  Procedure Laterality Date  . Bladder surgery  1980  . Penectomy      surgery for carcinoma of penis  . 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 Marital Status: Widowed    Spouse Name: N/A  . Number of  Children: N/A  . Years of Education: N/A   Occupational History  . Not on file.   Social History Main Topics  . Smoking status: Former Smoker -- 1.00 packs/day for 25 years    Types: Cigarettes    Start date: 01/04/1955    Quit date: 01/03/1970  . Smokeless tobacco: Never Used  . Alcohol Use: No  . Drug Use: No  . Sexual Activity: Not on file   Other Topics Concern  . Not on file   Social History Narrative     Filed Vitals:   02/18/15 1105  BP: 124/75  Pulse: 79  Height: 5\' 10"  (1.778 m)  Weight: 215 lb (97.523 kg)    PHYSICAL EXAM General: NAD HEENT: Normal. Neck: No JVD, no thyromegaly. Lungs: Clear to auscultation bilaterally with normal respiratory effort. CV: Nondisplaced PMI.  Regular rate and rhythm, normal S1/S2, no S3/S4, no murmur. No pretibial or periankle edema.  No carotid bruit.   Abdomen: Soft, nontender, no distention.  Neurologic: Alert and oriented.  Psych: Normal affect. Skin: Normal. Musculoskeletal: No gross deformities.  ECG: Most recent ECG reviewed.      ASSESSMENT AND PLAN: 1. CAD/high risk stress test: Symptomatically stable. Will arrange for coronary angiography. Continue medical therapy with ASA, Imdur, Plavix, and statin.   2. Essential HTN: Well controlled. No changes.  3. Hyperlipidemia: Currently on low-intensity statin therapy with lovastatin 20 mg. No changes.  4. High-grade asymptomatic right internal carotid artery stenosis: On ASA, Plavix, and statin, and follows with vascular. After coronary angiography, CEA can be planned.  Dispo: f/u after cath.  Kate Sable, M.D., F.A.C.C.

## 2015-03-02 NOTE — Progress Notes (Addendum)
TR BAND REMOVAL  LOCATION:   Right radial  DEFLATED PER PROTOCOL:    Yes.    TIME BAND OFF / DRESSING APPLIED:    1330p   SITE UPON ARRIVAL:    Level 1 bruisung  SITE AFTER BAND REMOVAL:    Level 1  Noted bruising above site  CIRCULATION SENSATION AND MOVEMENT:    Within Normal Limits   Yes.    COMMENTS:   VS remain stable .  Dr Nils Pyle in to see pt.  No complaint of discomfort at site.

## 2015-03-02 NOTE — Progress Notes (Signed)
  Echocardiogram 2D Echocardiogram has been performed.  Jennette Dubin 03/02/2015, 4:04 PM

## 2015-03-03 ENCOUNTER — Other Ambulatory Visit: Payer: Self-pay | Admitting: *Deleted

## 2015-03-03 ENCOUNTER — Ambulatory Visit (HOSPITAL_COMMUNITY): Payer: Medicare HMO

## 2015-03-03 ENCOUNTER — Ambulatory Visit (HOSPITAL_BASED_OUTPATIENT_CLINIC_OR_DEPARTMENT_OTHER): Payer: Medicare HMO

## 2015-03-03 DIAGNOSIS — I6521 Occlusion and stenosis of right carotid artery: Secondary | ICD-10-CM | POA: Diagnosis not present

## 2015-03-03 DIAGNOSIS — I6523 Occlusion and stenosis of bilateral carotid arteries: Secondary | ICD-10-CM

## 2015-03-03 DIAGNOSIS — Z87891 Personal history of nicotine dependence: Secondary | ICD-10-CM | POA: Diagnosis not present

## 2015-03-03 DIAGNOSIS — I129 Hypertensive chronic kidney disease with stage 1 through stage 4 chronic kidney disease, or unspecified chronic kidney disease: Secondary | ICD-10-CM | POA: Diagnosis not present

## 2015-03-03 DIAGNOSIS — Z01818 Encounter for other preprocedural examination: Secondary | ICD-10-CM | POA: Diagnosis not present

## 2015-03-03 DIAGNOSIS — I2510 Atherosclerotic heart disease of native coronary artery without angina pectoris: Secondary | ICD-10-CM | POA: Diagnosis not present

## 2015-03-03 DIAGNOSIS — Z7902 Long term (current) use of antithrombotics/antiplatelets: Secondary | ICD-10-CM | POA: Diagnosis not present

## 2015-03-03 DIAGNOSIS — R931 Abnormal findings on diagnostic imaging of heart and coronary circulation: Secondary | ICD-10-CM | POA: Diagnosis not present

## 2015-03-03 DIAGNOSIS — E1122 Type 2 diabetes mellitus with diabetic chronic kidney disease: Secondary | ICD-10-CM | POA: Diagnosis not present

## 2015-03-03 DIAGNOSIS — E785 Hyperlipidemia, unspecified: Secondary | ICD-10-CM | POA: Diagnosis not present

## 2015-03-03 DIAGNOSIS — Z7982 Long term (current) use of aspirin: Secondary | ICD-10-CM | POA: Diagnosis not present

## 2015-03-03 DIAGNOSIS — I6529 Occlusion and stenosis of unspecified carotid artery: Secondary | ICD-10-CM | POA: Diagnosis present

## 2015-03-03 DIAGNOSIS — Z8549 Personal history of malignant neoplasm of other male genital organs: Secondary | ICD-10-CM | POA: Diagnosis not present

## 2015-03-03 DIAGNOSIS — N189 Chronic kidney disease, unspecified: Secondary | ICD-10-CM | POA: Diagnosis not present

## 2015-03-03 LAB — PULMONARY FUNCTION TEST
DL/VA % pred: 93 %
DL/VA: 4.34 ml/min/mmHg/L
DLCO cor % pred: 51 %
DLCO cor: 17.35 ml/min/mmHg
DLCO unc % pred: 47 %
DLCO unc: 16.11 ml/min/mmHg
FEF 25-75 Post: 1.54 L/sec
FEF 25-75 Pre: 2.32 L/sec
FEF2575-%Change-Post: -33 %
FEF2575-%Pred-Post: 68 %
FEF2575-%Pred-Pre: 103 %
FEV1-%Change-Post: -5 %
FEV1-%Pred-Post: 76 %
FEV1-%Pred-Pre: 80 %
FEV1-Post: 2.39 L
FEV1-Pre: 2.53 L
FEV1FVC-%Change-Post: 7 %
FEV1FVC-%Pred-Pre: 110 %
FEV6-%Change-Post: -11 %
FEV6-%Pred-Post: 69 %
FEV6-%Pred-Pre: 78 %
FEV6-Post: 2.81 L
FEV6-Pre: 3.18 L
FEV6FVC-%Pred-Post: 106 %
FEV6FVC-%Pred-Pre: 106 %
FVC-%Change-Post: -11 %
FVC-%Pred-Post: 64 %
FVC-%Pred-Pre: 73 %
FVC-Post: 2.81 L
FVC-Pre: 3.18 L
Post FEV1/FVC ratio: 85 %
Post FEV6/FVC ratio: 100 %
Pre FEV1/FVC ratio: 79 %
Pre FEV6/FVC Ratio: 100 %
RV % pred: 69 %
RV: 1.83 L
TLC % pred: 73 %
TLC: 5.32 L

## 2015-03-03 LAB — COMPREHENSIVE METABOLIC PANEL
ALT: 11 U/L — ABNORMAL LOW (ref 17–63)
AST: 13 U/L — ABNORMAL LOW (ref 15–41)
Albumin: 3.1 g/dL — ABNORMAL LOW (ref 3.5–5.0)
Alkaline Phosphatase: 58 U/L (ref 38–126)
Anion gap: 8 (ref 5–15)
BUN: 27 mg/dL — ABNORMAL HIGH (ref 6–20)
CO2: 22 mmol/L (ref 22–32)
Calcium: 9.1 mg/dL (ref 8.9–10.3)
Chloride: 113 mmol/L — ABNORMAL HIGH (ref 101–111)
Creatinine, Ser: 1.9 mg/dL — ABNORMAL HIGH (ref 0.61–1.24)
GFR calc Af Amer: 38 mL/min — ABNORMAL LOW (ref >60)
GFR calc non Af Amer: 33 mL/min — ABNORMAL LOW (ref >60)
Glucose, Bld: 126 mg/dL — ABNORMAL HIGH (ref 65–99)
Potassium: 4.1 mmol/L (ref 3.5–5.1)
Sodium: 143 mmol/L (ref 135–145)
Total Bilirubin: 0.8 mg/dL (ref 0.3–1.2)
Total Protein: 5.3 g/dL — ABNORMAL LOW (ref 6.5–8.1)

## 2015-03-03 LAB — GLUCOSE, CAPILLARY: GLUCOSE-CAPILLARY: 130 mg/dL — AB (ref 65–99)

## 2015-03-03 LAB — TSH: TSH: 1.532 u[IU]/mL (ref 0.350–4.500)

## 2015-03-03 MED ORDER — ACETAMINOPHEN 325 MG PO TABS: 650.0000 mg | ORAL_TABLET | ORAL | Status: DC | PRN

## 2015-03-03 MED ORDER — ALBUTEROL SULFATE (2.5 MG/3ML) 0.083% IN NEBU
2.5000 mg | INHALATION_SOLUTION | Freq: Once | RESPIRATORY_TRACT | Status: AC
  Administered 2015-03-03: 2.5 mg via RESPIRATORY_TRACT

## 2015-03-03 NOTE — Progress Notes (Signed)
CARDIAC REHAB PHASE I   PRE:  Rate/Rhythm: 80 sinus rhythm  BP:  Supine:   Sitting: 140/70  Standing:    SaO2: 97% ra  MODE:  Ambulation: 550 ft   POST:  Rate/Rhythem: 109 sinus tach  BP:  Supine:   Sitting: 140/60  Standing:    SaO2: 100% ra  1045-1120  Pt ambulated in hallway x1 assist using rolling walker.  Brisk steady gait, asymptomatic. Pt returned to bed call light and friend at bedside.  Pre op [facility] education completed.  IS use instruction given per resp therapy, IS use reinforced.  Pt given cardiac surgery booklet and watching [facility] video.  Pt and friend verbalized understanding.    [employer]

## 2015-03-03 NOTE — Progress Notes (Signed)
   Cardiologist: Dr. Angelena [name] Subjective:  Ready to go. No CP, no SOB.   Objective:  Vital Signs in the last 24 hours: Temp:  [98.1 F (36.7 C)-98.3 F (36.8 C)] 98.1 F (36.7 C) (02/28 0500) Pulse Rate:  [71-84] 71 (02/28 0500) Resp:  [14-28] 15 (02/28 0500) BP: (136-177)/(58-88) 141/73 mmHg (02/28 0500) SpO2:  [96 %-100 %] 96 % (02/28 0500) Weight:  [211 lb 8 oz (95.936 kg)] 211 lb 8 oz (95.936 kg) (02/28 0500)  Intake/Output from previous day: 02/27 0701 - 02/28 0700 In: 1080 [P.O.:1080] Out: 1000 [Urine:1000]   Physical Exam: General: Well developed, well nourished, in no acute distress. Head:  Normocephalic and atraumatic. Lungs: Clear to auscultation and percussion. Heart: Normal S1 and S2.  No murmur, rubs or gallops.  Abdomen: soft, non-tender, positive bowel sounds.  Extremities: No clubbing or cyanosis. No edema. Cath site minor bruising Neurologic: Alert and oriented x 3.    Lab Results:  Recent Labs  03/02/15 0618  WBC 9.1  HGB 12.3*  PLT 320    Recent Labs  03/02/15 0618 03/03/15 0552  NA 143 143  K 4.5 4.1  CL 110 113*  CO2 22 22  GLUCOSE 181* 126*  BUN 32* 27*  CREATININE 2.15* 1.90*   No results for input(s): TROPONINI in the last 72 hours.  Invalid input(s): CK, MB Hepatic Function Panel  Recent Labs  03/03/15 0552  PROT 5.3*  ALBUMIN 3.1*  AST 13*  ALT 11*  ALKPHOS 58  BILITOT 0.8   No results for input(s): CHOL in the last 72 hours. No results for input(s): PROTIME in the last 72 hours.  Imaging: Dg Chest 2 View  03/03/2015  CLINICAL DATA:  Preoperative examination prior to and chest surgery. EXAM: CHEST  2 VIEW COMPARISON:  None in PACs FINDINGS: The lungs are mildly hypoinflated. There is left basilar atelectasis or scarring. The perihilar interstitial markings are coarse. The heart and pulmonary vascularity are normal. There is no pleural effusion. There is calcification of the anterior longitudinal ligament of the  thoracic spine with mild multilevel degenerative disc disease. IMPRESSION: Mild perihilar interstitial prominence may reflect low-grade interstitial edema. There is no cardiomegaly. Left basilar scarring or atelectasis. Electronically Signed   By: [name]  [name] M.D.   On: 03/03/2015 07:57   Personally viewed.   Telemetry: NSR Personally viewed.    Meds: Scheduled Meds: . amLODipine  5 mg Oral Daily  . aspirin EC  81 mg Oral Daily  . atorvastatin  40 mg Oral q1800  . cholecalciferol  2,000 Units Oral Daily  . clonazePAM  1 mg Oral QHS  . glimepiride  2 mg Oral BID WC  . isosorbide mononitrate  30 mg Oral Daily  . losartan  25 mg Oral QHS  . sodium chloride flush  3 mL Intravenous Q12H   Continuous Infusions:  PRN Meds:.sodium chloride, acetaminophen, nitroGLYCERIN, ondansetron (ZOFRAN) IV, sodium chloride flush  Assessment/Plan:  Active Problems:   Abnormal nuclear stress test   Coronary artery disease involving native coronary artery of native heart without angina pectoris   CAD (coronary artery disease)  CAD -Severe 3v CAD -CABG March 7 Dr. Darcey [name]  -Stop Plavix-[place] -Continue ASA 32 -Good EF, ECHO  OK for DC home while awaiting surgery If CP, NTG...Marland KitchenSevere call 911   [name] [name] 03/03/2015, 12:12 PM

## 2015-03-03 NOTE — Progress Notes (Signed)
1 Day Post-Op Procedure(s) (LRB): Left Heart Cath and Coronary Angiography (N/A) Intravascular Ultrasound/IVUS (N/A) Subjective: Patient stable after cardiac catheterization with minimum swelling of right wrist Creatinine following catheterization below baseline at 1.9 Echocardiogram images personally reviewed showing good LV and biventricular function and no significant valvular disease PFTs are acceptable for sternotomy Patient to be evaluated by Dr. Oneida Alar for the timing of right carotid endarterectomy The patient's Plavix has been stopped and will be held until surgery scheduled for Tuesday March 7 ABIs are negative for significant PAD Objective: Vital signs in last 24 hours: Temp:  [98.1 F (36.7 C)-98.3 F (36.8 C)] 98.1 F (36.7 C) (02/28 0500) Pulse Rate:  [61-84] 71 (02/28 0500) Cardiac Rhythm:  [-] Normal sinus rhythm (02/28 0700) Resp:  [14-28] 15 (02/28 0500) BP: (122-177)/(58-88) 141/73 mmHg (02/28 0500) SpO2:  [96 %-100 %] 96 % (02/28 0500) Weight:  [211 lb 8 oz (95.936 kg)] 211 lb 8 oz (95.936 kg) (02/28 0500)  Hemodynamic parameters for last 24 hours:  stable  Intake/Output from previous day: 02/27 0701 - 02/28 0700 In: 1080 [P.O.:1080] Out: 1000 [Urine:1000] Intake/Output this shift:        Exam    General- alert and comfortable   Lungs- clear without rales, wheezes   Cor- regular rate and rhythm, no murmur , gallop   Abdomen- soft, non-tender   Extremities - warm, non-tender, minimal edema   Neuro- oriented, appropriate, no focal weakness    Lab Results:  Recent Labs  03/02/15 0618  WBC 9.1  HGB 12.3*  HCT 38.2*  PLT 320   BMET:  Recent Labs  03/02/15 0618 03/03/15 0552  NA 143 143  K 4.5 4.1  CL 110 113*  CO2 22 22  GLUCOSE 181* 126*  BUN 32* 27*  CREATININE 2.15* 1.90*  CALCIUM 9.2 9.1    PT/INR:  Recent Labs  03/02/15 0618  LABPROT 14.1  INR 1.07   ABG No results found for: PHART, HCO3, TCO2, ACIDBASEDEF, O2SAT CBG  (last 3)   Recent Labs  03/02/15 1615 03/02/15 2102 03/03/15 1013  GLUCAP 129* 83 130*    Assessment/Plan: S/P Procedure(s) (LRB): Left Heart Cath and Coronary Angiography (N/A) Intravascular Ultrasound/IVUS (N/A)  Plan multivessel CABG for severe left main and three-vessel disease, probable CABG x4 on March 7. Procedure indications benefits and risks have been discussed in detail with the patient      [name] [name] 03/03/2015

## 2015-03-03 NOTE — Progress Notes (Signed)
Pre-op Cardiac Surgery  Carotid Findings:  Carotid duplex completed at [facility] 12/2014, results in [facility]. Please advise if repeat is necessary.  Upper Extremity Right Left  Brachial Pressures 139-Triphasic 152-Triphasic  Radial Waveforms Triphasic Triphasic  Ulnar Waveforms Triphasic Triphasic  Palmar Arch (Allen's Test) Signal decreases <50% with radial compression, decreases >50% with ulnar compression. Within normal limits.    Lower  Extremity Right Left  Dorsalis Pedis 198-Triphasic 159-Triphasic  Anterior Tibial    Posterior Tibial 185-Triphasic 185-Triphasic  Ankle/Brachial Indices 1.30 1.22    Findings:   ABI is within normal limits.  03/03/2015 8:29 AM Maudry Mayhew, RVT, RDCS, RDMS

## 2015-03-03 NOTE — Consult Note (Signed)
[facility] [place]  Patient name: [name] [name] MRN: [id number] DOB: [birth year] Sex: male  REASON FOR CONSULT: Asymptomatic Right internal carotid stenosis with pending CABG  HPI: [name] [name] is a 75 y.o. male, who we have followed for some time for an asymptomatic right ICA stenosis.  We were going to schedule him for elective CEA but deferred due to + stress test in 5/16.   He deferred cath at that time due to concerns about his renal function.  He recently underwent cath which showed multivessel disease and is scheduled for CABG with [name] next week.  He continues to deny TIA amaurosis or stroke symptoms.  His last duplex was about 2 mo ago which showed greater than 80% right ICA stenosis with velocities 426/137 but with no real stenosis on the contralateral side or in the vertebrals.  Other risk factors include hyperlipidemia, hypertension, diabetes all of which have been stable.  His renal function has been stable near his baseline creatinine of 2.  Past Medical History  Diagnosis Date  . Coronary artery disease     quiescent on medical therapy,abnormal exercise Cardiolite suggestive of apical ischemia;EF 54%,02/10  . Hyperlipidemia     abnormal LFT's,resolved,secondary to lipitor  . Chronic kidney disease     chronic renal insufficiency,single functional kidney  . Tobacco abuse   . Penile cancer ([place])     history of penile cancer/squamous cell  . Hypertension   . Shortness of breath dyspnea     with exertion  . Diabetes mellitus without complication (HCC)     Type 2  . Arthritis   . Gout   . Abnormal nuclear stress test     Family History  Problem Relation Age of Onset  . Liver disease Mother   . Gallbladder disease Father   . Heart attack Sister   . Bone cancer Sister     SOCIAL HISTORY: Social History   Social History  . Marital Status: Widowed    Spouse Name: N/A  . Number of Children: N/A  . Years of Education: N/A    Occupational History  . Not on file.   Social History Main Topics  . Smoking status: Former Smoker -- 1.00 packs/day for 25 years    Types: Cigarettes    Start date: 01/04/1955    Quit date: 01/03/1970  . Smokeless tobacco: Never Used  . Alcohol Use: No  . Drug Use: No  . Sexual Activity: Not on file   Other Topics Concern  . Not on file   Social History Narrative    No Known Allergies  Current Facility-Administered Medications  Medication Dose Route Frequency Provider Last Rate Last Dose  . 0.9 %  sodium chloride infusion  250 mL Intravenous PRN Burnell Blanks, MD      . acetaminophen (TYLENOL) tablet 650 mg  650 mg Oral Q4H PRN Burnell Blanks, MD      . amLODipine (NORVASC) tablet 5 mg  5 mg Oral Daily Burnell Blanks, MD   5 mg at 03/03/15 1013  . aspirin EC tablet 81 mg  81 mg Oral Daily Burnell Blanks, MD   81 mg at 03/03/15 1012  . atorvastatin (LIPITOR) tablet 40 mg  40 mg Oral q1800 Burnell Blanks, MD   40 mg at 03/02/15 1706  . cholecalciferol (VITAMIN D) tablet 2,000 Units  2,000 Units Oral Daily Burnell Blanks, MD   2,000 Units at 03/03/15 1012  .  clonazePAM (KLONOPIN) tablet 1 mg  1 mg Oral QHS Lamar Sprinkles, MD   1 mg at 03/02/15 2020  . glimepiride (AMARYL) tablet 2 mg  2 mg Oral BID WC Burnell Blanks, MD   2 mg at 03/03/15 1013  . isosorbide mononitrate (IMDUR) 24 hr tablet 30 mg  30 mg Oral Daily Burnell Blanks, MD   30 mg at 03/03/15 1012  . losartan (COZAAR) tablet 25 mg  25 mg Oral QHS Burnell Blanks, MD   25 mg at 03/02/15 2015  . nitroGLYCERIN (NITROSTAT) SL tablet 0.4 mg  0.4 mg Sublingual Q5 min PRN Burnell Blanks, MD      . ondansetron [facility]) injection 4 mg  4 mg Intravenous Q6H PRN Burnell Blanks, MD      . sodium chloride flush (NS) 0.9 % injection 3 mL  3 mL Intravenous Q12H Burnell Blanks, MD   3 mL at 03/02/15 1553  . sodium chloride flush (NS) 0.9 %  injection 3 mL  3 mL Intravenous PRN Burnell Blanks, MD        REVIEW OF SYSTEMS:  [X]  denotes positive finding, [ ]  denotes negative finding Cardiac  Comments:  Chest pain or chest pressure:    Shortness of breath upon exertion: x   Short of breath when lying flat:    Irregular heart rhythm:        Vascular    Pain in calf, thigh, or hip brought on by ambulation:    Pain in feet at night that wakes you up from your sleep:     Blood clot in your veins:    Leg swelling:         Pulmonary    Oxygen at home:    Productive cough:     Wheezing:         Neurologic    Sudden weakness in arms or legs:     Sudden numbness in arms or legs:     Sudden onset of difficulty speaking or slurred speech:    Temporary loss of vision in one eye:     Problems with dizziness:         Gastrointestinal    Blood in stool:     Vomited blood:         Genitourinary    Burning when urinating:     Blood in urine:        Psychiatric    Major depression:         Hematologic    Bleeding problems:    Problems with blood clotting too easily:        Skin    Rashes or ulcers:        Constitutional    Fever or chills:      PHYSICAL EXAM: Filed Vitals:   03/02/15 1325 03/02/15 1410 03/02/15 2012 03/03/15 0500  BP: 140/75 138/58 137/68 141/73  Pulse: 74 74 79 71  Temp:  98.2 F (36.8 C) 98.3 F (36.8 C) 98.1 F (36.7 C)  TempSrc:  Oral Oral   Resp: 18 17 18 15   Height:  5\' 11"  (1.803 m)    Weight:    211 lb 8 oz (95.936 kg)  SpO2: 100% 99% 97% 96%    GENERAL: The patient is a well-nourished male, in no acute distress. The vital signs are documented above. CARDIAC: There is a regular rate and rhythm.  VASCULAR: 2+ femoral and carotid pulses, right side bruit PULMONARY: There  is good air exchange bilaterally without wheezing or rales. ABDOMEN: Soft and non-tender with normal pitched bowel sounds.  MUSCULOSKELETAL: There are no major deformities or cyanosis. NEUROLOGIC: No  focal weakness or paresthesias are detected.  Symmetric 5/5 motor exam upper and lower extremities, no facial assymetry SKIN: There are no ulcers or rashes noted. PSYCHIATRIC: The patient has a normal affect.  DATA:  CBC    Component Value Date/Time   WBC 9.1 03/02/2015 0618   RBC 4.03* 03/02/2015 0618   HGB 12.3* 03/02/2015 0618   HCT 38.2* 03/02/2015 0618   PLT 320 03/02/2015 0618   MCV 94.8 03/02/2015 0618   MCH 30.5 03/02/2015 0618   MCHC 32.2 03/02/2015 0618   RDW 12.6 03/02/2015 0618   BMET    Component Value Date/Time   NA 143 03/03/2015 0552   K 4.1 03/03/2015 0552   CL 113* 03/03/2015 0552   CO2 22 03/03/2015 0552   GLUCOSE 126* 03/03/2015 0552   BUN 27* 03/03/2015 0552   CREATININE 1.90* 03/03/2015 0552   CALCIUM 9.1 03/03/2015 0552   GFRNONAA 33* 03/03/2015 0552   GFRAA 38* 03/03/2015 0552      MEDICAL ISSUES: Asymptomatic 80% right ICA stenosis with no other significant contralateral carotid or vertebral disease. Would defer CEA until recovers from CABG.  No urgent need for combined procedure in absence of contralateral lesion and lack of symptoms.  I will schedule follow up for him in 4-6 weeks for consideration of elective CEA.   [name] [facility] [employer] [facility] [employer]: [phone]

## 2015-03-03 NOTE — Discharge Instructions (Signed)
Take 1 NTG, under your tongue, while sitting.  If no relief of pain may repeat NTG, one tab every 5 minutes up to 3 tablets total over 15 minutes.  If no relief CALL 911.  If you have dizziness/lightheadness  while taking NTG, stop taking and call 911.        Heart healthy diet  Return to [facility] the morning of 03/10/2015 Dr. Lucianne Lei Trigt's office should give time.  Nothing to eat or drink after midnight the night before.  No more plavix.   Continue to use the incentive spirometer to help improve lungs for surgery.  Call [facility] at [phone] if any bleeding, swelling or drainage at cath site.  May shower, no tub baths for 48 hours for groin sticks. No lifting over 5 pounds for 3 days.  No Driving for 3 days

## 2015-03-03 NOTE — Discharge Summary (Signed)
Physician Discharge Summary       Patient ID: [name] [name] MRN: [id number] DOB/AGE: 1940/04/17 75 y.o.  Admit date: 03/02/2015 Discharge date: 03/03/2015 Primary Cardiologist:Dr. Dionisio David  Discharge Diagnoses:  Principal Problem:   Abnormal nuclear stress test Active Problems:   Coronary artery disease involving native coronary artery of native heart without angina pectoris- plans for CABG   Pure hypercholesterolemia   Essential hypertension, benign   CAD (coronary artery disease)   Carotid stenosis- high grade Rt   Discharged Condition: good  Procedures: 03/02/15 cardiac cath by Dr. Angelena Form Conclusion     Ost RCA lesion, 30% stenosed.  Mid LAD lesion, 100% stenosed.  Ost LM lesion, 70% stenosed.  Ost LAD lesion, 50% stenosed.  Ost Ramus to Ramus lesion, 99% stenosed.  1st Mrg lesion, 30% stenosed.  3rd Mrg lesion, 70% stenosed.  Mid Cx-1 lesion, 70% stenosed.  Mid Cx-2 lesion, 99% stenosed.  Ost 1st Diag to 1st Diag lesion, 99% stenosed.  1. Severe double vessel CAD with moderately severe eccentric left main stenosis. The left main stenosis is confirmed with IVUS imaging. The mid LAD is chronically occluded and fills from right to left collaterals. There is moderately severe disease in the mid Circumflex artery and severe disease in the distal segment of the Circumflex leading into the moderate caliber left posterolateral and posterior descending branches.   Recommendations: He is found to have multivessel CAD including left main disease. I will admit to telemetry for hydration over next 18 hours given his chronic kidney disease. Will hold Plavix to allow washout. Will ask CT surgery to see him to discuss CABG.    Hospital Course: 75 year old male with history of asymptomatic high-grade right internal carotid artery stenosis and has been unwilling to undergo coronary angiography for fear of acute on chronic renal insufficiency. He is on dual antiplatelet  therapy with aspirin and Plavix.  He was thought to have probable coronary artery disease.  With upcoming carotid surgery nuclear stress test was ordered.   Nuclear stress test dated 05/23/14 demonstrated a moderate-sized inferior wall infarct with moderate peri-infarct ischemia, as well as a completely reversible moderate-sized region of apical ischemia, and deemed a high risk stress test.  He denied chest pain, palpitations, leg swelling, and shortness of breath. He is able to carry on his activities of daily living.  Dr. Oneida Alar would preferred him to have coronary angiography to define his coronary anatomy prior to carotid endarterectomy. He agreed to proceed.  Cardiac cath with severe CAD see above.  Consult with Dr. [name] was obtained and plan multivessel CABG for severe left main and three-vessel disease, probable CABG x4 on March 7. Procedure indications benefits and risks have been discussed in detail with the patient by Dr. [name].   Creatinine following catheterization below baseline at 1.9 Echocardiogram images showing good LV and biventricular function and no significant valvular disease PFTs are acceptable for sternotomy Patient to be evaluated by Dr. Oneida Alar for the timing of right carotid endarterectomy The patient's Plavix has been stopped and will be held until surgery scheduled for Tuesday March 7 ABIs are negative for significant PAD  Pt was seen and evaluated by Dr. Marlou Porch.  Pt without pain plan for d/c today and return 03/10/15.  Pt stable for discharge. Cardiac rehab saw him prior to discharge and reviewed incentive spirometer and will follow back post op.  Pt has now stopped his plavix.  Dr. Oneida Alar will determine timing of Rt CEA.  Consults: vascular surgery  Significant Diagnostic Studies:  BMP Latest Ref Rng 03/03/2015 03/02/2015 05/27/2014  Glucose 65 - 99 mg/dL 126(H) 181(H) 120(H)  BUN 6 - 20 mg/dL 27(H) 32(H) 43(H)  Creatinine 0.61 - 1.24 mg/dL 1.90(H) 2.15(H)  2.45(H)  Sodium 135 - 145 mmol/L 143 143 141  Potassium 3.5 - 5.1 mmol/L 4.1 4.5 5.5(H)  Chloride 101 - 111 mmol/L 113(H) 110 106  CO2 22 - 32 mmol/L '22 22 26  ' Calcium 8.9 - 10.3 mg/dL 9.1 9.2 9.6     CBC Latest Ref Rng 03/02/2015 05/27/2014  WBC 4.0 - 10.5 K/uL 9.1 11.0(H)  Hemoglobin 13.0 - 17.0 g/dL 12.3(L) 13.2  Hematocrit 39.0 - 52.0 % 38.2(L) 40.7  Platelets 150 - 400 K/uL 320 400   Hepatic Function Latest Ref Rng 03/03/2015 05/27/2014  Total Protein 6.5 - 8.1 g/dL 5.3(L) 6.7  Albumin 3.5 - 5.0 g/dL 3.1(L) 3.8  AST 15 - 41 U/L 13(L) 18  ALT 17 - 63 U/L 11(L) 17  Alk Phosphatase 38 - 126 U/L 58 72  Total Bilirubin 0.3 - 1.2 mg/dL 0.8 0.5   U/A clear  Pulmonary function study is Pending results but reviewed by Dr. [name]  CHEST 2 VIEW  03/03/15 COMPARISON: None in PACs FINDINGS: The lungs are mildly hypoinflated. There is left basilar atelectasis or scarring. The perihilar interstitial markings are coarse. The heart and pulmonary vascularity are normal. There is no pleural effusion. There is calcification of the anterior longitudinal ligament of the thoracic spine with mild multilevel degenerative disc disease. IMPRESSION: Mild perihilar interstitial prominence may reflect low-grade interstitial edema. There is no cardiomegaly. Left basilar scarring or atelectasis.  Arterial dopplers  Summary: Bilateral ABIs are within normal limits  2D Echo: Study Conclusions  - Left ventricle: The cavity size was normal. Wall thickness was normal. Systolic function was normal. The estimated ejection fraction was in the range of 55% to 60%. Although no diagnostic regional wall motion abnormality was identified, this possibility cannot be completely excluded on the basis of this study. Doppler parameters are consistent with abnormal left ventricular relaxation (grade 1 diastolic dysfunction). - Mitral valve: Calcified annulus.              *[facility]  Discharge Exam: Blood pressure 141/73, pulse 71, temperature 98.1 F (36.7 C), temperature source Oral, resp. rate 15, height '5\' 11"'  (1.803 m), weight 211 lb 8 oz (95.936 kg), SpO2 96 %.  Disposition: Home      Medication List    STOP taking these medications        ALKA-SELTZER ANTACID 500 MG Caps  Generic drug:  Calcium Carbonate Antacid     amoxicillin 500 MG capsule  Commonly known as:  AMOXIL     CINNAMON PO     clopidogrel 75 MG tablet  Commonly known as:  PLAVIX     CoQ-10 100 MG Caps     Fish Oil 1200 MG Caps     RA SUPHEDRINE PE SINUS 5-325 MG Tabs  Generic drug:  Phenylephrine-Acetaminophen      TAKE these medications        acetaminophen 325 MG tablet  Commonly known as:  TYLENOL  Take 2 tablets (650 mg total) by mouth every 4 (four) hours as needed for headache or mild pain.     Acidophilus Caps capsule  Take 1 capsule by mouth daily as needed.     amLODipine 5 MG tablet  Commonly known as:  NORVASC  Take 1 tablet by  mouth daily.     aspirin EC 81 MG tablet  Take 81 mg by mouth daily.     clonazePAM 1 MG tablet  Commonly known as:  KLONOPIN  Take 1 tablet by mouth daily.     clotrimazole-betamethasone cream  Commonly known as:  LOTRISONE  Apply 1 application topically as needed.     colchicine 0.6 MG tablet  Take 0.6 mg by mouth daily as needed.     glimepiride 2 MG tablet  Commonly known as:  AMARYL  Take 2 mg by mouth 2 (two) times daily.     isosorbide mononitrate 30 MG 24 hr tablet  Commonly known as:  IMDUR  Take 30 mg by mouth daily.     losartan 25 MG tablet  Commonly known as:  COZAAR  Take 25 mg by mouth at bedtime.     lovastatin 20 MG tablet  Commonly known as:  MEVACOR  TAKE ONE TABLET BY MOUTH AT BEDTIME     METROGEL 1 % gel  Generic drug:  metroNIDAZOLE  Apply 1 application topically daily.     nitroGLYCERIN 0.4 MG SL tablet  Commonly known as:  NITROSTAT  Place 1 tablet (0.4 mg total) under the tongue every 5  (five) minutes as needed.     sodium polystyrene 15 GM/60ML suspension  Commonly known as:  KAYEXALATE  Take 30 g by mouth every 30 (thirty) days.     Vitamin D3 2000 units Tabs  Take 1 tablet by mouth daily.       Follow-up Information    Follow up with Len Childs, MD.   Specialty:  Cardiothoracic Surgery   Why:  call his office to verify time to come to the hospital on 03/10/15    Contact information:   [address] [place] [address] [phone]       Follow up with Herminio Commons, MD.   Specialty:  Cardiology   Why:  call if office if problems prior to surgery   Contact information:   [place] [address] [phone]        Discharge Instructions: Take 1 NTG, under your tongue, while sitting.  If no relief of pain may repeat NTG, one tab every 5 minutes up to 3 tablets total over 15 minutes.  If no relief CALL 911.  If you have dizziness/lightheadness  while taking NTG, stop taking and call 911.        Heart healthy diet  Return to [facility] the morning of 03/10/2015 Dr. Lucianne Lei Trigt's office should give time.  Nothing to eat or drink after midnight the night before.  No more plavix.   Continue to use the incentive spirometer to help improve lungs for surgery.  Call [facility] at [phone] if any bleeding, swelling or drainage at cath site.  May shower, no tub baths for 48 hours for groin sticks. No lifting over 5 pounds for 3 days.  No Driving for 3 days  Signed: [facility]: [facility] 03/03/2015, 12:39 PM  Time spent on discharge : >30 minutes.   Personally seen and examined. Agree with above. Severe coronary artery disease awaiting CABG, Plavix washout.  See progress note for full details.  Candee Furbish, MD

## 2015-03-05 ENCOUNTER — Telehealth: Payer: Self-pay | Admitting: Vascular Surgery

## 2015-03-05 NOTE — Telephone Encounter (Signed)
Spoke with patient to schedule. He is concerned about not being strong enough after an upcoming surgery, but will call us if he needs to reschedule, dpm

## 2015-03-05 NOTE — Telephone Encounter (Signed)
-----   Message from Mena Goes, RN sent at 03/03/2015  3:25 PM EST ----- Regarding: schedule   ----- Message -----    From: Elam Dutch, MD    Sent: 03/03/2015   1:09 PM      To: [facility] Charge Pool  Level 4 consult right ICA stenosis Requesting Lucianne Lei Trigt Pt needs follow up visit with me and repeat duplex 4-6 weeks.  [name]

## 2015-03-06 ENCOUNTER — Other Ambulatory Visit (HOSPITAL_COMMUNITY): Payer: Self-pay | Admitting: *Deleted

## 2015-03-06 NOTE — Pre-Procedure Instructions (Signed)
    [name] [name]  03/06/2015      [facility] [address] [place] [address] Phone: [phone] Fax: [phone]    Your procedure is scheduled on Tuesday, March 10, 2015 at 7:30 AM.   Report to [facility] Entrance "A" Admitting Office at 5:30 AM.   Call this number if you have problems the morning of surgery: [phone]     Remember:  Do not eat food or drink liquids after midnight tonight.  Take these medicines the morning of surgery with A SIP OF WATER: Amlodipine (Norvasc), Clonazepam (Klonopin), Isosorbide Mononitrate (Imdur), NTG - if needed   Do not wear jewelry.  Do not wear lotions, powders, or cologne.  You may NOT wear deodorant.  Men may shave face and neck.  Do not bring valuables to the hospital.  [facility] is not responsible for any belongings or valuables.  Contacts, dentures or bridgework may not be worn into surgery.  Leave your suitcase in the car.  After surgery it may be brought to your room.  For patients admitted to the hospital, discharge time will be determined by your treatment team.  Special instructions:  See "Preparing for Surgery" Instruction sheet.  Please read over the following fact sheets that you were given. Pain Booklet, Coughing and Deep Breathing, Blood Transfusion Information, MRSA Information and Surgical Site Infection Prevention

## 2015-03-07 DIAGNOSIS — B354 Tinea corporis: Secondary | ICD-10-CM | POA: Diagnosis not present

## 2015-03-09 ENCOUNTER — Encounter (HOSPITAL_COMMUNITY): Payer: Self-pay

## 2015-03-09 ENCOUNTER — Encounter (HOSPITAL_COMMUNITY)
Admit: 2015-03-09 | Discharge: 2015-03-09 | Disposition: A | Discharge status: Home / Self Care | Payer: Medicare HMO | Source: Ambulatory Visit | Attending: Cardiothoracic Surgery | Admitting: Cardiothoracic Surgery

## 2015-03-09 VITALS — BP 139/53 | HR 85 | Temp 97.8°F | Resp 18 | Ht 71.0 in | Wt 213.3 lb

## 2015-03-09 DIAGNOSIS — Z0183 Encounter for blood typing: Secondary | ICD-10-CM | POA: Insufficient documentation

## 2015-03-09 DIAGNOSIS — I2510 Atherosclerotic heart disease of native coronary artery without angina pectoris: Secondary | ICD-10-CM

## 2015-03-09 DIAGNOSIS — Z01812 Encounter for preprocedural laboratory examination: Secondary | ICD-10-CM | POA: Insufficient documentation

## 2015-03-09 LAB — COMPREHENSIVE METABOLIC PANEL
ALT: 31 U/L (ref 17–63)
AST: 22 U/L (ref 15–41)
Albumin: 3.5 g/dL (ref 3.5–5.0)
Alkaline Phosphatase: 76 U/L (ref 38–126)
Anion gap: 9 (ref 5–15)
BUN: 28 mg/dL — ABNORMAL HIGH (ref 6–20)
CO2: 20 mmol/L — ABNORMAL LOW (ref 22–32)
Calcium: 8.9 mg/dL (ref 8.9–10.3)
Chloride: 111 mmol/L (ref 101–111)
Creatinine, Ser: 1.92 mg/dL — ABNORMAL HIGH (ref 0.61–1.24)
GFR calc Af Amer: 37 mL/min — ABNORMAL LOW (ref >60)
GFR calc non Af Amer: 32 mL/min — ABNORMAL LOW (ref >60)
Glucose, Bld: 162 mg/dL — ABNORMAL HIGH (ref 65–99)
Potassium: 5.2 mmol/L — ABNORMAL HIGH (ref 3.5–5.1)
Sodium: 140 mmol/L (ref 135–145)
Total Bilirubin: 0.5 mg/dL (ref 0.3–1.2)
Total Protein: 5.9 g/dL — ABNORMAL LOW (ref 6.5–8.1)

## 2015-03-09 LAB — CBC
HCT: 37.6 % — ABNORMAL LOW (ref 39.0–52.0)
Hemoglobin: 12 g/dL — ABNORMAL LOW (ref 13.0–17.0)
MCH: 30.7 pg (ref 26.0–34.0)
MCHC: 31.9 g/dL (ref 30.0–36.0)
MCV: 96.2 fL (ref 78.0–100.0)
Platelets: 346 10*3/uL (ref 150–400)
RBC: 3.91 MIL/uL — ABNORMAL LOW (ref 4.22–5.81)
RDW: 12.8 % (ref 11.5–15.5)
WBC: 8.3 10*3/uL (ref 4.0–10.5)

## 2015-03-09 LAB — BLOOD GAS, ARTERIAL
Acid-base deficit: 1.3 mmol/L (ref 0.0–2.0)
Bicarbonate: 22.7 mEq/L (ref 20.0–24.0)
Drawn by: 449841
FIO2: 0.21
O2 Saturation: 96.1 %
Patient temperature: 98.6
TCO2: 23.8 mmol/L (ref 0–100)
pCO2 arterial: 37 mmHg (ref 35.0–45.0)
pH, Arterial: 7.405 (ref 7.350–7.450)
pO2, Arterial: 86.5 mmHg (ref 80.0–100.0)

## 2015-03-09 LAB — URINALYSIS, ROUTINE W REFLEX MICROSCOPIC
Bilirubin Urine: NEGATIVE
Glucose, UA: NEGATIVE mg/dL
Hgb urine dipstick: NEGATIVE
Ketones, ur: NEGATIVE mg/dL
Leukocytes, UA: NEGATIVE
Nitrite: NEGATIVE
Protein, ur: NEGATIVE mg/dL
Specific Gravity, Urine: 1.009 (ref 1.005–1.030)
pH: 6.5 (ref 5.0–8.0)

## 2015-03-09 LAB — APTT: aPTT: 72 seconds — ABNORMAL HIGH (ref 24–37)

## 2015-03-09 LAB — PROTIME-INR
INR: 1.06 (ref 0.00–1.49)
Prothrombin Time: 14 seconds (ref 11.6–15.2)

## 2015-03-09 LAB — GLUCOSE, CAPILLARY: Glucose-Capillary: 116 mg/dL — ABNORMAL HIGH (ref 65–99)

## 2015-03-09 MED ORDER — POTASSIUM CHLORIDE 2 MEQ/ML IV SOLN
80.0000 meq | INTRAVENOUS | Status: DC
  Filled 2015-03-09: qty 40

## 2015-03-09 MED ORDER — EPINEPHRINE HCL 1 MG/ML IJ SOLN
0.0000 ug/min | INTRAVENOUS | Status: DC
  Filled 2015-03-09: qty 4

## 2015-03-09 MED ORDER — DEXMEDETOMIDINE HCL IN NACL 400 MCG/100ML IV SOLN
0.1000 ug/kg/h | INTRAVENOUS | Status: DC
  Filled 2015-03-09: qty 100

## 2015-03-09 MED ORDER — VANCOMYCIN HCL 10 G IV SOLR
1500.0000 mg | INTRAVENOUS | Status: AC
  Administered 2015-03-10: 1500 mg via INTRAVENOUS
  Filled 2015-03-09 (×2): qty 1500

## 2015-03-09 MED ORDER — NITROGLYCERIN IN D5W 200-5 MCG/ML-% IV SOLN
2.0000 ug/min | INTRAVENOUS | Status: AC
  Administered 2015-03-10: 5 ug/min via INTRAVENOUS
  Filled 2015-03-09: qty 250

## 2015-03-09 MED ORDER — DEXTROSE 5 % IV SOLN
1.5000 g | INTRAVENOUS | Status: AC
  Administered 2015-03-10: .75 g via INTRAVENOUS
  Administered 2015-03-10: 1.5 g via INTRAVENOUS
  Filled 2015-03-09 (×2): qty 1.5

## 2015-03-09 MED ORDER — DOPAMINE-DEXTROSE 3.2-5 MG/ML-% IV SOLN
0.0000 ug/kg/min | INTRAVENOUS | Status: DC
  Filled 2015-03-09: qty 250

## 2015-03-09 MED ORDER — SODIUM CHLORIDE 0.9 % IV SOLN
INTRAVENOUS | Status: DC
  Filled 2015-03-09: qty 40

## 2015-03-09 MED ORDER — SODIUM CHLORIDE 0.9 % IV SOLN
INTRAVENOUS | Status: DC
  Filled 2015-03-09: qty 2.5

## 2015-03-09 MED ORDER — DEXTROSE 5 % IV SOLN
750.0000 mg | INTRAVENOUS | Status: DC
  Filled 2015-03-09: qty 750

## 2015-03-09 MED ORDER — PLASMA-LYTE 148 IV SOLN
INTRAVENOUS | Status: AC
  Administered 2015-03-10: 500 mL
  Filled 2015-03-09: qty 2.5

## 2015-03-09 MED ORDER — PHENYLEPHRINE HCL 10 MG/ML IJ SOLN
30.0000 ug/min | INTRAVENOUS | Status: DC
  Filled 2015-03-09: qty 2

## 2015-03-09 MED ORDER — SODIUM CHLORIDE 0.9 % IV SOLN
INTRAVENOUS | Status: DC
  Filled 2015-03-09: qty 30

## 2015-03-09 MED ORDER — MAGNESIUM SULFATE 50 % IJ SOLN
40.0000 meq | INTRAMUSCULAR | Status: DC
  Filled 2015-03-09: qty 10

## 2015-03-09 NOTE — Anesthesia Preprocedure Evaluation (Addendum)
Anesthesia Evaluation  Patient identified by MRN, date of birth, ID band Patient awake    Reviewed: Allergy & Precautions, H&P , NPO status , Patient's Chart, lab work & pertinent test results  Airway Mallampati: III  TM Distance: >3 FB Neck ROM: Full    Dental no notable dental hx. (+) Teeth Intact, Dental Advisory Given   Pulmonary neg pulmonary ROS, former smoker,    Pulmonary exam normal breath sounds clear to auscultation       Cardiovascular hypertension, Pt. on medications + CAD and + Peripheral Vascular Disease   Rhythm:Regular Rate:Normal     Neuro/Psych negative neurological ROS  negative psych ROS   GI/Hepatic negative GI ROS, Neg liver ROS,   Endo/Other  diabetes, Type 2, Oral Hypoglycemic Agents  Renal/GU Renal InsufficiencyRenal disease  negative genitourinary   Musculoskeletal  (+) Arthritis , Osteoarthritis,    Abdominal   Peds  Hematology negative hematology ROS (+)   Anesthesia Other Findings   Reproductive/Obstetrics negative OB ROS                           Anesthesia Physical Anesthesia Plan  ASA: IV  Anesthesia Plan: General   Post-op Pain Management:    Induction: Intravenous  Airway Management Planned: Oral ETT  Additional Equipment: Arterial line, CVP, PA Cath, TEE and Ultrasound Guidance Line Placement  Intra-op Plan:   Post-operative Plan: Post-operative intubation/ventilation  Informed Consent: I have reviewed the patients History and Physical, chart, labs and discussed the procedure including the risks, benefits and alternatives for the proposed anesthesia with the patient or authorized representative who has indicated his/her understanding and acceptance.   Dental advisory given  Plan Discussed with: CRNA, Anesthesiologist and Surgeon  Anesthesia Plan Comments:        Anesthesia Quick Evaluation

## 2015-03-10 ENCOUNTER — Inpatient Hospital Stay (HOSPITAL_COMMUNITY): Payer: Medicare HMO | Admitting: Anesthesiology

## 2015-03-10 ENCOUNTER — Encounter (HOSPITAL_COMMUNITY)
Admission: AD | Disposition: A | Discharge status: Home Health | Payer: Self-pay | Source: Ambulatory Visit | Attending: Cardiothoracic Surgery

## 2015-03-10 ENCOUNTER — Inpatient Hospital Stay (HOSPITAL_COMMUNITY): Payer: Medicare HMO

## 2015-03-10 ENCOUNTER — Encounter (HOSPITAL_COMMUNITY): Payer: Self-pay | Admitting: General Practice

## 2015-03-10 ENCOUNTER — Inpatient Hospital Stay (HOSPITAL_COMMUNITY)
Admission: AD | Admit: 2015-03-10 | Discharge: 2015-03-25 | DRG: 236 | Disposition: A | Discharge status: Home Health | Payer: Medicare HMO | Source: Ambulatory Visit | Attending: Cardiothoracic Surgery | Admitting: Cardiothoracic Surgery

## 2015-03-10 DIAGNOSIS — N183 Chronic kidney disease, stage 3 (moderate): Secondary | ICD-10-CM | POA: Diagnosis present

## 2015-03-10 DIAGNOSIS — Z7984 Long term (current) use of oral hypoglycemic drugs: Secondary | ICD-10-CM

## 2015-03-10 DIAGNOSIS — I4891 Unspecified atrial fibrillation: Secondary | ICD-10-CM | POA: Diagnosis not present

## 2015-03-10 DIAGNOSIS — E785 Hyperlipidemia, unspecified: Secondary | ICD-10-CM | POA: Diagnosis present

## 2015-03-10 DIAGNOSIS — Z7902 Long term (current) use of antithrombotics/antiplatelets: Secondary | ICD-10-CM

## 2015-03-10 DIAGNOSIS — N179 Acute kidney failure, unspecified: Secondary | ICD-10-CM | POA: Diagnosis present

## 2015-03-10 DIAGNOSIS — I6521 Occlusion and stenosis of right carotid artery: Secondary | ICD-10-CM | POA: Diagnosis present

## 2015-03-10 DIAGNOSIS — Z452 Encounter for adjustment and management of vascular access device: Secondary | ICD-10-CM | POA: Diagnosis not present

## 2015-03-10 DIAGNOSIS — Z87891 Personal history of nicotine dependence: Secondary | ICD-10-CM | POA: Diagnosis not present

## 2015-03-10 DIAGNOSIS — I2510 Atherosclerotic heart disease of native coronary artery without angina pectoris: Principal | ICD-10-CM | POA: Diagnosis present

## 2015-03-10 DIAGNOSIS — I517 Cardiomegaly: Secondary | ICD-10-CM | POA: Diagnosis present

## 2015-03-10 DIAGNOSIS — N189 Chronic kidney disease, unspecified: Secondary | ICD-10-CM

## 2015-03-10 DIAGNOSIS — Z7982 Long term (current) use of aspirin: Secondary | ICD-10-CM

## 2015-03-10 DIAGNOSIS — J90 Pleural effusion, not elsewhere classified: Secondary | ICD-10-CM | POA: Diagnosis not present

## 2015-03-10 DIAGNOSIS — I950 Idiopathic hypotension: Secondary | ICD-10-CM | POA: Diagnosis not present

## 2015-03-10 DIAGNOSIS — M109 Gout, unspecified: Secondary | ICD-10-CM | POA: Diagnosis present

## 2015-03-10 DIAGNOSIS — E8809 Other disorders of plasma-protein metabolism, not elsewhere classified: Secondary | ICD-10-CM | POA: Diagnosis not present

## 2015-03-10 DIAGNOSIS — I9581 Postprocedural hypotension: Secondary | ICD-10-CM | POA: Diagnosis not present

## 2015-03-10 DIAGNOSIS — E118 Type 2 diabetes mellitus with unspecified complications: Secondary | ICD-10-CM | POA: Diagnosis present

## 2015-03-10 DIAGNOSIS — M1990 Unspecified osteoarthritis, unspecified site: Secondary | ICD-10-CM | POA: Diagnosis not present

## 2015-03-10 DIAGNOSIS — Z9689 Presence of other specified functional implants: Secondary | ICD-10-CM

## 2015-03-10 DIAGNOSIS — N990 Postprocedural (acute) (chronic) kidney failure: Secondary | ICD-10-CM | POA: Diagnosis not present

## 2015-03-10 DIAGNOSIS — I951 Orthostatic hypotension: Secondary | ICD-10-CM | POA: Diagnosis not present

## 2015-03-10 DIAGNOSIS — J811 Chronic pulmonary edema: Secondary | ICD-10-CM | POA: Diagnosis not present

## 2015-03-10 DIAGNOSIS — Z951 Presence of aortocoronary bypass graft: Secondary | ICD-10-CM

## 2015-03-10 DIAGNOSIS — E8770 Fluid overload, unspecified: Secondary | ICD-10-CM | POA: Diagnosis not present

## 2015-03-10 DIAGNOSIS — Z8549 Personal history of malignant neoplasm of other male genital organs: Secondary | ICD-10-CM

## 2015-03-10 DIAGNOSIS — I129 Hypertensive chronic kidney disease with stage 1 through stage 4 chronic kidney disease, or unspecified chronic kidney disease: Secondary | ICD-10-CM | POA: Diagnosis present

## 2015-03-10 DIAGNOSIS — D689 Coagulation defect, unspecified: Secondary | ICD-10-CM | POA: Diagnosis not present

## 2015-03-10 DIAGNOSIS — N170 Acute kidney failure with tubular necrosis: Secondary | ICD-10-CM

## 2015-03-10 DIAGNOSIS — Z23 Encounter for immunization: Secondary | ICD-10-CM

## 2015-03-10 DIAGNOSIS — I080 Rheumatic disorders of both mitral and aortic valves: Secondary | ICD-10-CM | POA: Diagnosis not present

## 2015-03-10 DIAGNOSIS — D62 Acute posthemorrhagic anemia: Secondary | ICD-10-CM | POA: Diagnosis not present

## 2015-03-10 DIAGNOSIS — E1122 Type 2 diabetes mellitus with diabetic chronic kidney disease: Secondary | ICD-10-CM | POA: Diagnosis present

## 2015-03-10 DIAGNOSIS — R0602 Shortness of breath: Secondary | ICD-10-CM | POA: Diagnosis not present

## 2015-03-10 DIAGNOSIS — I25110 Atherosclerotic heart disease of native coronary artery with unstable angina pectoris: Secondary | ICD-10-CM | POA: Diagnosis not present

## 2015-03-10 DIAGNOSIS — J9811 Atelectasis: Secondary | ICD-10-CM | POA: Diagnosis not present

## 2015-03-10 HISTORY — PX: CORONARY ARTERY BYPASS GRAFT: SHX141

## 2015-03-10 HISTORY — PX: TEE WITHOUT CARDIOVERSION: SHX5443

## 2015-03-10 LAB — POCT I-STAT, CHEM 8
BUN: 29 mg/dL — ABNORMAL HIGH (ref 6–20)
BUN: 29 mg/dL — ABNORMAL HIGH (ref 6–20)
BUN: 27 mg/dL — ABNORMAL HIGH (ref 6–20)
BUN: 27 mg/dL — ABNORMAL HIGH (ref 6–20)
BUN: 27 mg/dL — ABNORMAL HIGH (ref 6–20)
BUN: 28 mg/dL — ABNORMAL HIGH (ref 6–20)
BUN: 25 mg/dL — ABNORMAL HIGH (ref 6–20)
Calcium, Ion: 1.28 mmol/L (ref 1.13–1.30)
Calcium, Ion: 1.29 mmol/L (ref 1.13–1.30)
Calcium, Ion: 1.15 mmol/L (ref 1.13–1.30)
Calcium, Ion: 1.15 mmol/L (ref 1.13–1.30)
Calcium, Ion: 1.02 mmol/L — ABNORMAL LOW (ref 1.13–1.30)
Calcium, Ion: 1.14 mmol/L (ref 1.13–1.30)
Calcium, Ion: 1.17 mmol/L (ref 1.13–1.30)
Chloride: 107 mmol/L (ref 101–111)
Chloride: 106 mmol/L (ref 101–111)
Chloride: 103 mmol/L (ref 101–111)
Chloride: 105 mmol/L (ref 101–111)
Chloride: 104 mmol/L (ref 101–111)
Chloride: 105 mmol/L (ref 101–111)
Chloride: 108 mmol/L (ref 101–111)
Creatinine, Ser: 2 mg/dL — ABNORMAL HIGH (ref 0.61–1.24)
Creatinine, Ser: 1.9 mg/dL — ABNORMAL HIGH (ref 0.61–1.24)
Creatinine, Ser: 1.5 mg/dL — ABNORMAL HIGH (ref 0.61–1.24)
Creatinine, Ser: 1.6 mg/dL — ABNORMAL HIGH (ref 0.61–1.24)
Creatinine, Ser: 1.6 mg/dL — ABNORMAL HIGH (ref 0.61–1.24)
Creatinine, Ser: 1.7 mg/dL — ABNORMAL HIGH (ref 0.61–1.24)
Creatinine, Ser: 1.8 mg/dL — ABNORMAL HIGH (ref 0.61–1.24)
Glucose, Bld: 131 mg/dL — ABNORMAL HIGH (ref 65–99)
Glucose, Bld: 151 mg/dL — ABNORMAL HIGH (ref 65–99)
Glucose, Bld: 123 mg/dL — ABNORMAL HIGH (ref 65–99)
Glucose, Bld: 121 mg/dL — ABNORMAL HIGH (ref 65–99)
Glucose, Bld: 175 mg/dL — ABNORMAL HIGH (ref 65–99)
Glucose, Bld: 203 mg/dL — ABNORMAL HIGH (ref 65–99)
Glucose, Bld: 128 mg/dL — ABNORMAL HIGH (ref 65–99)
HCT: 32 % — ABNORMAL LOW (ref 39.0–52.0)
HCT: 32 % — ABNORMAL LOW (ref 39.0–52.0)
HCT: 26 % — ABNORMAL LOW (ref 39.0–52.0)
HCT: 25 % — ABNORMAL LOW (ref 39.0–52.0)
HCT: 27 % — ABNORMAL LOW (ref 39.0–52.0)
HCT: 27 % — ABNORMAL LOW (ref 39.0–52.0)
HCT: 28 % — ABNORMAL LOW (ref 39.0–52.0)
Hemoglobin: 10.9 g/dL — ABNORMAL LOW (ref 13.0–17.0)
Hemoglobin: 10.9 g/dL — ABNORMAL LOW (ref 13.0–17.0)
Hemoglobin: 8.8 g/dL — ABNORMAL LOW (ref 13.0–17.0)
Hemoglobin: 8.5 g/dL — ABNORMAL LOW (ref 13.0–17.0)
Hemoglobin: 9.2 g/dL — ABNORMAL LOW (ref 13.0–17.0)
Hemoglobin: 9.2 g/dL — ABNORMAL LOW (ref 13.0–17.0)
Hemoglobin: 9.5 g/dL — ABNORMAL LOW (ref 13.0–17.0)
Potassium: 4.6 mmol/L (ref 3.5–5.1)
Potassium: 4.9 mmol/L (ref 3.5–5.1)
Potassium: 4.6 mmol/L (ref 3.5–5.1)
Potassium: 5.5 mmol/L — ABNORMAL HIGH (ref 3.5–5.1)
Potassium: 4.5 mmol/L (ref 3.5–5.1)
Potassium: 5.3 mmol/L — ABNORMAL HIGH (ref 3.5–5.1)
Potassium: 4.3 mmol/L (ref 3.5–5.1)
Sodium: 143 mmol/L (ref 135–145)
Sodium: 143 mmol/L (ref 135–145)
Sodium: 139 mmol/L (ref 135–145)
Sodium: 140 mmol/L (ref 135–145)
Sodium: 140 mmol/L (ref 135–145)
Sodium: 141 mmol/L (ref 135–145)
Sodium: 143 mmol/L (ref 135–145)
TCO2: 25 mmol/L (ref 0–100)
TCO2: 24 mmol/L (ref 0–100)
TCO2: 29 mmol/L (ref 0–100)
TCO2: 28 mmol/L (ref 0–100)
TCO2: 26 mmol/L (ref 0–100)
TCO2: 27 mmol/L (ref 0–100)
TCO2: 22 mmol/L (ref 0–100)

## 2015-03-10 LAB — POCT I-STAT 3, ART BLOOD GAS (G3+)
Acid-Base Excess: 1 mmol/L (ref 0.0–2.0)
Acid-Base Excess: 1 mmol/L (ref 0.0–2.0)
Acid-base deficit: 3 mmol/L — ABNORMAL HIGH (ref 0.0–2.0)
Acid-base deficit: 4 mmol/L — ABNORMAL HIGH (ref 0.0–2.0)
Bicarbonate: 26.6 mEq/L — ABNORMAL HIGH (ref 20.0–24.0)
Bicarbonate: 25.6 mEq/L — ABNORMAL HIGH (ref 20.0–24.0)
Bicarbonate: 22.8 mEq/L (ref 20.0–24.0)
Bicarbonate: 22.2 mEq/L (ref 20.0–24.0)
O2 Saturation: 100 %
O2 Saturation: 100 %
O2 Saturation: 91 %
O2 Saturation: 97 %
Patient temperature: 35.9
Patient temperature: 37.2
TCO2: 28 mmol/L (ref 0–100)
TCO2: 27 mmol/L (ref 0–100)
TCO2: 24 mmol/L (ref 0–100)
TCO2: 24 mmol/L (ref 0–100)
pCO2 arterial: 49.3 mmHg — ABNORMAL HIGH (ref 35.0–45.0)
pCO2 arterial: 41.6 mmHg (ref 35.0–45.0)
pCO2 arterial: 41.6 mmHg (ref 35.0–45.0)
pCO2 arterial: 44.7 mmHg (ref 35.0–45.0)
pH, Arterial: 7.34 — ABNORMAL LOW (ref 7.350–7.450)
pH, Arterial: 7.397 (ref 7.350–7.450)
pH, Arterial: 7.342 — ABNORMAL LOW (ref 7.350–7.450)
pH, Arterial: 7.305 — ABNORMAL LOW (ref 7.350–7.450)
pO2, Arterial: 423 mmHg — ABNORMAL HIGH (ref 80.0–100.0)
pO2, Arterial: 316 mmHg — ABNORMAL HIGH (ref 80.0–100.0)
pO2, Arterial: 62 mmHg — ABNORMAL LOW (ref 80.0–100.0)
pO2, Arterial: 100 mmHg (ref 80.0–100.0)

## 2015-03-10 LAB — PROTIME-INR
INR: 1.48 (ref 0.00–1.49)
Prothrombin Time: 18 seconds — ABNORMAL HIGH (ref 11.6–15.2)

## 2015-03-10 LAB — CREATININE, SERUM
Creatinine, Ser: 1.89 mg/dL — ABNORMAL HIGH (ref 0.61–1.24)
GFR calc Af Amer: 38 mL/min — ABNORMAL LOW (ref >60)
GFR calc non Af Amer: 33 mL/min — ABNORMAL LOW (ref >60)

## 2015-03-10 LAB — GLUCOSE, CAPILLARY
Glucose-Capillary: 104 mg/dL — ABNORMAL HIGH (ref 65–99)
Glucose-Capillary: 60 mg/dL — ABNORMAL LOW (ref 65–99)
Glucose-Capillary: 94 mg/dL (ref 65–99)
Glucose-Capillary: 113 mg/dL — ABNORMAL HIGH (ref 65–99)
Glucose-Capillary: 108 mg/dL — ABNORMAL HIGH (ref 65–99)

## 2015-03-10 LAB — HEMOGLOBIN A1C
Hgb A1c MFr Bld: 7.3 % — ABNORMAL HIGH (ref 4.8–5.6)
Mean Plasma Glucose: 163 mg/dL

## 2015-03-10 LAB — APTT
APTT: 36 s (ref 24–37)
aPTT: 35 seconds (ref 24–37)

## 2015-03-10 LAB — CBC
HCT: 29 % — ABNORMAL LOW (ref 39.0–52.0)
HCT: 27.9 % — ABNORMAL LOW (ref 39.0–52.0)
Hemoglobin: 9.2 g/dL — ABNORMAL LOW (ref 13.0–17.0)
Hemoglobin: 9.4 g/dL — ABNORMAL LOW (ref 13.0–17.0)
MCH: 30.3 pg (ref 26.0–34.0)
MCH: 32 pg (ref 26.0–34.0)
MCHC: 31.7 g/dL (ref 30.0–36.0)
MCHC: 33.7 g/dL (ref 30.0–36.0)
MCV: 95.4 fL (ref 78.0–100.0)
MCV: 94.9 fL (ref 78.0–100.0)
PLATELETS: 186 10*3/uL (ref 150–400)
Platelets: 167 10*3/uL (ref 150–400)
RBC: 3.04 MIL/uL — ABNORMAL LOW (ref 4.22–5.81)
RBC: 2.94 MIL/uL — ABNORMAL LOW (ref 4.22–5.81)
RDW: 12.7 % (ref 11.5–15.5)
RDW: 12.6 % (ref 11.5–15.5)
WBC: 16.5 10*3/uL — AB (ref 4.0–10.5)
WBC: 12.7 10*3/uL — ABNORMAL HIGH (ref 4.0–10.5)

## 2015-03-10 LAB — POCT I-STAT 4, (NA,K, GLUC, HGB,HCT)
Glucose, Bld: 118 mg/dL — ABNORMAL HIGH (ref 65–99)
HCT: 29 % — ABNORMAL LOW (ref 39.0–52.0)
Hemoglobin: 9.9 g/dL — ABNORMAL LOW (ref 13.0–17.0)
Potassium: 4 mmol/L (ref 3.5–5.1)
Sodium: 144 mmol/L (ref 135–145)

## 2015-03-10 LAB — HEMOGLOBIN AND HEMATOCRIT, BLOOD
HCT: 25.6 % — ABNORMAL LOW (ref 39.0–52.0)
Hemoglobin: 8.6 g/dL — ABNORMAL LOW (ref 13.0–17.0)

## 2015-03-10 LAB — PLATELET COUNT: Platelets: 205 10*3/uL (ref 150–400)

## 2015-03-10 LAB — MAGNESIUM: Magnesium: 1.8 mg/dL (ref 1.7–2.4)

## 2015-03-10 SURGERY — CORONARY ARTERY BYPASS GRAFTING (CABG)
Anesthesia: General | Site: Chest

## 2015-03-10 MED ORDER — SODIUM CHLORIDE 0.9 % IV SOLN
10.0000 g | INTRAVENOUS | Status: DC | PRN
  Administered 2015-03-10: 5 g/h via INTRAVENOUS

## 2015-03-10 MED ORDER — METOPROLOL TARTRATE 12.5 MG HALF TABLET
ORAL_TABLET | ORAL | Status: AC
  Filled 2015-03-10: qty 1

## 2015-03-10 MED ORDER — MIDAZOLAM HCL 2 MG/2ML IJ SOLN: 2.0000 mg | INTRAMUSCULAR | Status: DC | PRN

## 2015-03-10 MED ORDER — VANCOMYCIN HCL IN DEXTROSE 1-5 GM/200ML-% IV SOLN
1000.0000 mg | Freq: Once | INTRAVENOUS | Status: DC
  Filled 2015-03-10: qty 200

## 2015-03-10 MED ORDER — BISACODYL 10 MG RE SUPP
10.0000 mg | Freq: Every day | RECTAL | Status: DC
  Filled 2015-03-10: qty 1

## 2015-03-10 MED ORDER — DEXTROSE 50 % IV SOLN
INTRAVENOUS | Status: AC
  Administered 2015-03-10: 16 mL
  Filled 2015-03-10: qty 50

## 2015-03-10 MED ORDER — VITAMIN D 1000 UNITS PO TABS
2000.0000 [IU] | ORAL_TABLET | Freq: Every day | ORAL | Status: DC
  Administered 2015-03-11 – 2015-03-25 (×15): 2000 [IU] via ORAL
  Filled 2015-03-10 (×16): qty 2

## 2015-03-10 MED ORDER — PROTAMINE SULFATE 10 MG/ML IV SOLN
INTRAVENOUS | Status: DC | PRN
  Administered 2015-03-10 (×2): 80 mg via INTRAVENOUS
  Administered 2015-03-10: 10 mg via INTRAVENOUS
  Administered 2015-03-10: 40 mg via INTRAVENOUS
  Administered 2015-03-10 (×2): 30 mg via INTRAVENOUS
  Administered 2015-03-10 (×2): 40 mg via INTRAVENOUS

## 2015-03-10 MED ORDER — ROCURONIUM BROMIDE 50 MG/5ML IV SOLN
INTRAVENOUS | Status: AC
  Filled 2015-03-10: qty 1

## 2015-03-10 MED ORDER — POTASSIUM CHLORIDE 10 MEQ/50ML IV SOLN: 10.0000 meq | INTRAVENOUS | Status: AC

## 2015-03-10 MED ORDER — PHENYLEPHRINE HCL 10 MG/ML IJ SOLN
0.0000 ug/min | INTRAVENOUS | Status: DC
  Administered 2015-03-11: 25 ug/min via INTRAVENOUS
  Administered 2015-03-12: 30 ug/min via INTRAVENOUS
  Administered 2015-03-12: 50 ug/min via INTRAVENOUS
  Administered 2015-03-13: 40 ug/min via INTRAVENOUS
  Administered 2015-03-13 (×2): 50 ug/min via INTRAVENOUS
  Administered 2015-03-14: 25 ug/min via INTRAVENOUS
  Administered 2015-03-14: 40 ug/min via INTRAVENOUS
  Administered 2015-03-15 – 2015-03-16 (×2): 30 ug/min via INTRAVENOUS
  Filled 2015-03-10 (×14): qty 2

## 2015-03-10 MED ORDER — HEPARIN SODIUM (PORCINE) 1000 UNIT/ML IJ SOLN
INTRAMUSCULAR | Status: AC
  Filled 2015-03-10: qty 1

## 2015-03-10 MED ORDER — ACETAMINOPHEN 160 MG/5ML PO SOLN
1000.0000 mg | Freq: Four times a day (QID) | ORAL | Status: AC
Start: 2015-03-11 — End: 2015-03-15

## 2015-03-10 MED ORDER — LACTATED RINGERS IV SOLN
INTRAVENOUS | Status: DC | PRN
  Administered 2015-03-10: 07:00:00 via INTRAVENOUS

## 2015-03-10 MED ORDER — LACTATED RINGERS IV SOLN
500.0000 mL | Freq: Once | INTRAVENOUS | Status: DC | PRN
Start: 2015-03-10 — End: 2015-03-10

## 2015-03-10 MED ORDER — FENTANYL CITRATE (PF) 100 MCG/2ML IJ SOLN
INTRAMUSCULAR | Status: DC | PRN
  Administered 2015-03-10: 400 ug via INTRAVENOUS
  Administered 2015-03-10: 250 ug via INTRAVENOUS
  Administered 2015-03-10 (×2): 50 ug via INTRAVENOUS
  Administered 2015-03-10: 250 ug via INTRAVENOUS
  Administered 2015-03-10: 200 ug via INTRAVENOUS
  Administered 2015-03-10: 150 ug via INTRAVENOUS

## 2015-03-10 MED ORDER — SODIUM CHLORIDE 0.9 % IV SOLN
200.0000 ug | INTRAVENOUS | Status: DC | PRN
  Administered 2015-03-10: .2 ug/kg/h via INTRAVENOUS

## 2015-03-10 MED ORDER — METOPROLOL TARTRATE 1 MG/ML IV SOLN: 2.5000 mg | INTRAVENOUS | Status: DC | PRN

## 2015-03-10 MED ORDER — ANTISEPTIC ORAL RINSE SOLUTION (CORINZ)
7.0000 mL | OROMUCOSAL | Status: DC
Start: 2015-03-10 — End: 2015-03-11
  Administered 2015-03-10 – 2015-03-11 (×4): 7 mL via OROMUCOSAL

## 2015-03-10 MED ORDER — SODIUM CHLORIDE 0.9 % IV SOLN: 250.0000 mL | INTRAVENOUS | Status: DC

## 2015-03-10 MED ORDER — DOPAMINE-DEXTROSE 3.2-5 MG/ML-% IV SOLN
2.5000 ug/kg/min | INTRAVENOUS | Status: DC
  Administered 2015-03-12 – 2015-03-18 (×4): 2.5 ug/kg/min via INTRAVENOUS
  Filled 2015-03-10 (×4): qty 250

## 2015-03-10 MED ORDER — MIDAZOLAM HCL 5 MG/5ML IJ SOLN
INTRAMUSCULAR | Status: DC | PRN
  Administered 2015-03-10: 1 mg via INTRAVENOUS
  Administered 2015-03-10: 2 mg via INTRAVENOUS
  Administered 2015-03-10 (×2): 3 mg via INTRAVENOUS
  Administered 2015-03-10: 2 mg via INTRAVENOUS
  Administered 2015-03-10: 1 mg via INTRAVENOUS

## 2015-03-10 MED ORDER — DEXTROSE 5 % IV SOLN
20.0000 mg | INTRAVENOUS | Status: DC | PRN
  Administered 2015-03-10: 20 ug/min via INTRAVENOUS

## 2015-03-10 MED ORDER — ALBUMIN HUMAN 5 % IV SOLN
250.0000 mL | INTRAVENOUS | Status: AC | PRN
  Administered 2015-03-10 (×3): 250 mL via INTRAVENOUS
  Filled 2015-03-10: qty 250

## 2015-03-10 MED ORDER — INSULIN REGULAR BOLUS VIA INFUSION
0.0000 [IU] | Freq: Three times a day (TID) | INTRAVENOUS | Status: DC
  Administered 2015-03-11: 1.6 [IU] via INTRAVENOUS
  Filled 2015-03-10: qty 10

## 2015-03-10 MED ORDER — PANTOPRAZOLE SODIUM 40 MG PO TBEC
40.0000 mg | DELAYED_RELEASE_TABLET | Freq: Every day | ORAL | Status: DC
  Administered 2015-03-12 – 2015-03-25 (×14): 40 mg via ORAL
  Filled 2015-03-10 (×14): qty 1

## 2015-03-10 MED ORDER — SODIUM CHLORIDE 0.9 % IV SOLN
250.0000 [IU] | INTRAVENOUS | Status: DC | PRN
  Administered 2015-03-10: 1.4 [IU]/h via INTRAVENOUS

## 2015-03-10 MED ORDER — ASPIRIN 81 MG PO CHEW
324.0000 mg | CHEWABLE_TABLET | Freq: Every day | ORAL | Status: DC
  Filled 2015-03-10 (×2): qty 4

## 2015-03-10 MED ORDER — MORPHINE SULFATE (PF) 2 MG/ML IV SOLN
2.0000 mg | INTRAVENOUS | Status: DC | PRN
  Administered 2015-03-11 (×2): 2 mg via INTRAVENOUS
  Filled 2015-03-10 (×2): qty 1

## 2015-03-10 MED ORDER — PROTAMINE SULFATE 10 MG/ML IV SOLN
INTRAVENOUS | Status: AC
  Filled 2015-03-10: qty 10

## 2015-03-10 MED ORDER — ALBUMIN HUMAN 5 % IV SOLN
INTRAVENOUS | Status: DC | PRN
  Administered 2015-03-10 (×2): via INTRAVENOUS

## 2015-03-10 MED ORDER — SODIUM CHLORIDE 0.9% FLUSH
3.0000 mL | Freq: Two times a day (BID) | INTRAVENOUS | Status: DC
  Administered 2015-03-11 – 2015-03-24 (×11): 3 mL via INTRAVENOUS

## 2015-03-10 MED ORDER — CHLORHEXIDINE GLUCONATE 0.12 % MT SOLN
OROMUCOSAL | Status: AC
  Filled 2015-03-10: qty 15

## 2015-03-10 MED ORDER — OXYCODONE HCL 5 MG PO TABS
5.0000 mg | ORAL_TABLET | ORAL | Status: DC | PRN
  Filled 2015-03-10: qty 2

## 2015-03-10 MED ORDER — PROPOFOL 10 MG/ML IV BOLUS
INTRAVENOUS | Status: AC
  Filled 2015-03-10: qty 20

## 2015-03-10 MED ORDER — EPHEDRINE SULFATE 50 MG/ML IJ SOLN
INTRAMUSCULAR | Status: AC
  Filled 2015-03-10: qty 1

## 2015-03-10 MED ORDER — LIDOCAINE HCL (CARDIAC) 20 MG/ML IV SOLN
INTRAVENOUS | Status: AC
  Filled 2015-03-10: qty 5

## 2015-03-10 MED ORDER — CLONAZEPAM 1 MG PO TABS
1.0000 mg | ORAL_TABLET | Freq: Every day | ORAL | Status: DC
  Administered 2015-03-11 – 2015-03-24 (×14): 1 mg via ORAL
  Filled 2015-03-10 (×16): qty 1

## 2015-03-10 MED ORDER — SODIUM CHLORIDE 0.9 % IV SOLN
Freq: Once | INTRAVENOUS | Status: AC
  Administered 2015-03-10: 13:00:00 via INTRAVENOUS

## 2015-03-10 MED ORDER — FENTANYL CITRATE (PF) 250 MCG/5ML IJ SOLN
INTRAMUSCULAR | Status: AC
Start: 2015-03-10 — End: 2015-03-10
  Filled 2015-03-10: qty 30

## 2015-03-10 MED ORDER — HEMOSTATIC AGENTS (NO CHARGE) OPTIME
TOPICAL | Status: DC | PRN
  Administered 2015-03-10 (×4): 1 via TOPICAL

## 2015-03-10 MED ORDER — FAMOTIDINE IN NACL 20-0.9 MG/50ML-% IV SOLN
20.0000 mg | Freq: Two times a day (BID) | INTRAVENOUS | Status: DC
  Administered 2015-03-10: 20 mg via INTRAVENOUS

## 2015-03-10 MED ORDER — PROPOFOL 10 MG/ML IV BOLUS
INTRAVENOUS | Status: DC | PRN
  Administered 2015-03-10: 70 mg via INTRAVENOUS

## 2015-03-10 MED ORDER — ACETAMINOPHEN 160 MG/5ML PO SOLN: 650.0000 mg | Freq: Once | ORAL | Status: AC

## 2015-03-10 MED ORDER — ALBUMIN HUMAN 5 % IV SOLN: 250.0000 mL | INTRAVENOUS | Status: DC | PRN

## 2015-03-10 MED ORDER — PHENYLEPHRINE HCL 10 MG/ML IJ SOLN
10.0000 mg | INTRAVENOUS | Status: DC | PRN
  Administered 2015-03-10: 40 ug/min via INTRAVENOUS

## 2015-03-10 MED ORDER — CLOTRIMAZOLE-BETAMETHASONE 1-0.05 % EX CREA: 1.0000 "application " | TOPICAL_CREAM | CUTANEOUS | Status: DC | PRN

## 2015-03-10 MED ORDER — MAGNESIUM SULFATE 4 GM/100ML IV SOLN
4.0000 g | Freq: Once | INTRAVENOUS | Status: DC
  Filled 2015-03-10: qty 100

## 2015-03-10 MED ORDER — ROCURONIUM BROMIDE 50 MG/5ML IV SOLN
INTRAVENOUS | Status: AC
Start: 2015-03-10 — End: 2015-03-10
  Filled 2015-03-10: qty 1

## 2015-03-10 MED ORDER — CHLORHEXIDINE GLUCONATE 0.12 % MT SOLN
15.0000 mL | OROMUCOSAL | Status: AC
  Administered 2015-03-10: 15 mL via OROMUCOSAL
  Filled 2015-03-10: qty 15

## 2015-03-10 MED ORDER — LACTATED RINGERS IV SOLN: INTRAVENOUS | Status: DC

## 2015-03-10 MED ORDER — DEXTROSE 5 % IV SOLN
1.5000 g | Freq: Two times a day (BID) | INTRAVENOUS | Status: AC
  Administered 2015-03-10 – 2015-03-12 (×4): 1.5 g via INTRAVENOUS
  Filled 2015-03-10 (×4): qty 1.5

## 2015-03-10 MED ORDER — ACETAMINOPHEN 500 MG PO TABS
1000.0000 mg | ORAL_TABLET | Freq: Four times a day (QID) | ORAL | Status: AC
  Administered 2015-03-11 – 2015-03-15 (×16): 1000 mg via ORAL
  Filled 2015-03-10 (×17): qty 2

## 2015-03-10 MED ORDER — METOPROLOL TARTRATE 12.5 MG HALF TABLET
12.5000 mg | ORAL_TABLET | Freq: Once | ORAL | Status: AC
  Administered 2015-03-10: 12.5 mg via ORAL

## 2015-03-10 MED ORDER — LIDOCAINE HCL (CARDIAC) 20 MG/ML IV SOLN
INTRAVENOUS | Status: DC | PRN
  Administered 2015-03-10: 100 mg via INTRAVENOUS

## 2015-03-10 MED ORDER — TRAMADOL HCL 50 MG PO TABS
50.0000 mg | ORAL_TABLET | ORAL | Status: DC | PRN
  Administered 2015-03-14: 100 mg via ORAL
  Administered 2015-03-14: 50 mg via ORAL
  Filled 2015-03-10: qty 2
  Filled 2015-03-10: qty 1

## 2015-03-10 MED ORDER — PHENYLEPHRINE HCL 10 MG/ML IJ SOLN
INTRAMUSCULAR | Status: DC | PRN
  Administered 2015-03-10 (×2): 40 ug via INTRAVENOUS

## 2015-03-10 MED ORDER — METOCLOPRAMIDE HCL 5 MG/ML IJ SOLN
10.0000 mg | Freq: Four times a day (QID) | INTRAMUSCULAR | Status: AC
  Administered 2015-03-10 – 2015-03-11 (×4): 10 mg via INTRAVENOUS
  Filled 2015-03-10 (×3): qty 2

## 2015-03-10 MED ORDER — BISACODYL 5 MG PO TBEC
10.0000 mg | DELAYED_RELEASE_TABLET | Freq: Every day | ORAL | Status: DC
  Administered 2015-03-11 – 2015-03-23 (×10): 10 mg via ORAL
  Filled 2015-03-10 (×12): qty 2

## 2015-03-10 MED ORDER — SODIUM CHLORIDE 0.9% FLUSH
3.0000 mL | INTRAVENOUS | Status: DC | PRN
Start: 2015-03-11 — End: 2015-03-25

## 2015-03-10 MED ORDER — METOPROLOL TARTRATE 25 MG/10 ML ORAL SUSPENSION: 12.5000 mg | Freq: Two times a day (BID) | ORAL | Status: DC

## 2015-03-10 MED ORDER — NITROGLYCERIN IN D5W 200-5 MCG/ML-% IV SOLN: 0.0000 ug/min | INTRAVENOUS | Status: DC

## 2015-03-10 MED ORDER — HEPARIN SODIUM (PORCINE) 1000 UNIT/ML IJ SOLN
INTRAMUSCULAR | Status: DC | PRN
  Administered 2015-03-10: 31 mL via INTRAVENOUS
  Administered 2015-03-10: 3 mL via INTRAVENOUS
  Administered 2015-03-10: 2 mL via INTRAVENOUS

## 2015-03-10 MED ORDER — DEXMEDETOMIDINE HCL IN NACL 200 MCG/50ML IV SOLN
0.0000 ug/kg/h | INTRAVENOUS | Status: DC
  Administered 2015-03-10: 0.7 ug/kg/h via INTRAVENOUS
  Filled 2015-03-10 (×2): qty 50

## 2015-03-10 MED ORDER — SODIUM CHLORIDE 0.9 % IV SOLN
0.5000 g/h | Freq: Once | INTRAVENOUS | Status: DC
  Filled 2015-03-10: qty 20

## 2015-03-10 MED ORDER — CHLORHEXIDINE GLUCONATE 0.12% ORAL RINSE (MEDLINE KIT)
15.0000 mL | Freq: Two times a day (BID) | OROMUCOSAL | Status: DC
Start: 2015-03-10 — End: 2015-03-11
  Administered 2015-03-10 – 2015-03-11 (×2): 15 mL via OROMUCOSAL

## 2015-03-10 MED ORDER — MIDAZOLAM HCL 10 MG/2ML IJ SOLN
INTRAMUSCULAR | Status: AC
  Filled 2015-03-10: qty 4

## 2015-03-10 MED ORDER — INSULIN REGULAR HUMAN 100 UNIT/ML IJ SOLN
INTRAMUSCULAR | Status: DC
  Administered 2015-03-10: 1.4 [IU]/h via INTRAVENOUS
  Filled 2015-03-10 (×2): qty 2.5

## 2015-03-10 MED ORDER — MORPHINE SULFATE (PF) 2 MG/ML IV SOLN: 1.0000 mg | INTRAVENOUS | Status: DC | PRN

## 2015-03-10 MED ORDER — SODIUM CHLORIDE 0.9 % IV SOLN
INTRAVENOUS | Status: DC
  Administered 2015-03-17: 20 mL/h via INTRAVENOUS
  Administered 2015-03-18: 250 mL via INTRAVENOUS

## 2015-03-10 MED ORDER — SUCCINYLCHOLINE CHLORIDE 20 MG/ML IJ SOLN
INTRAMUSCULAR | Status: AC
  Filled 2015-03-10: qty 1

## 2015-03-10 MED ORDER — DOCUSATE SODIUM 100 MG PO CAPS
200.0000 mg | ORAL_CAPSULE | Freq: Every day | ORAL | Status: DC
  Administered 2015-03-11 – 2015-03-23 (×9): 200 mg via ORAL
  Filled 2015-03-10 (×12): qty 2

## 2015-03-10 MED ORDER — CHLORHEXIDINE GLUCONATE 0.12 % MT SOLN
15.0000 mL | Freq: Once | OROMUCOSAL | Status: AC
  Administered 2015-03-10: 15 mL via OROMUCOSAL

## 2015-03-10 MED ORDER — PRAVASTATIN SODIUM 20 MG PO TABS
20.0000 mg | ORAL_TABLET | Freq: Every day | ORAL | Status: DC
  Administered 2015-03-11 – 2015-03-24 (×13): 20 mg via ORAL
  Filled 2015-03-10 (×14): qty 1

## 2015-03-10 MED ORDER — SODIUM CHLORIDE 0.9 % IJ SOLN
INTRAMUSCULAR | Status: AC
Start: 2015-03-10 — End: 2015-03-10
  Filled 2015-03-10: qty 30

## 2015-03-10 MED ORDER — ACETAMINOPHEN 650 MG RE SUPP
650.0000 mg | Freq: Once | RECTAL | Status: AC
  Administered 2015-03-10: 650 mg via RECTAL

## 2015-03-10 MED ORDER — ROCURONIUM BROMIDE 50 MG/5ML IV SOLN
INTRAVENOUS | Status: AC
  Filled 2015-03-10: qty 2

## 2015-03-10 MED ORDER — 0.9 % SODIUM CHLORIDE (POUR BTL) OPTIME
TOPICAL | Status: DC | PRN
  Administered 2015-03-10: 6000 mL

## 2015-03-10 MED ORDER — VANCOMYCIN HCL IN DEXTROSE 1-5 GM/200ML-% IV SOLN
1000.0000 mg | INTRAVENOUS | Status: AC
  Administered 2015-03-10 – 2015-03-11 (×2): 1000 mg via INTRAVENOUS
  Filled 2015-03-10 (×2): qty 200

## 2015-03-10 MED ORDER — ASPIRIN EC 325 MG PO TBEC
325.0000 mg | DELAYED_RELEASE_TABLET | Freq: Every day | ORAL | Status: DC
  Administered 2015-03-11 – 2015-03-25 (×14): 325 mg via ORAL
  Filled 2015-03-10 (×14): qty 1

## 2015-03-10 MED ORDER — SODIUM CHLORIDE 0.9 % IJ SOLN
INTRAMUSCULAR | Status: DC | PRN
  Administered 2015-03-10 (×3): 4 mL via TOPICAL

## 2015-03-10 MED ORDER — METOPROLOL TARTRATE 12.5 MG HALF TABLET
12.5000 mg | ORAL_TABLET | Freq: Two times a day (BID) | ORAL | Status: DC
  Administered 2015-03-12 – 2015-03-20 (×7): 12.5 mg via ORAL
  Filled 2015-03-10 (×9): qty 1

## 2015-03-10 MED ORDER — SODIUM CHLORIDE 0.45 % IV SOLN
INTRAVENOUS | Status: DC | PRN
  Administered 2015-03-10: 15:00:00 via INTRAVENOUS

## 2015-03-10 MED ORDER — COLCHICINE 0.6 MG PO TABS: 0.6000 mg | ORAL_TABLET | Freq: Every day | ORAL | Status: DC | PRN

## 2015-03-10 MED ORDER — ONDANSETRON HCL 4 MG/2ML IJ SOLN
4.0000 mg | Freq: Four times a day (QID) | INTRAMUSCULAR | Status: DC | PRN
  Administered 2015-03-10 – 2015-03-14 (×2): 4 mg via INTRAVENOUS
  Filled 2015-03-10 (×2): qty 2

## 2015-03-10 MED ORDER — CHLORHEXIDINE GLUCONATE 4 % EX LIQD: 30.0000 mL | CUTANEOUS | Status: DC

## 2015-03-10 MED ORDER — DOPAMINE-DEXTROSE 3.2-5 MG/ML-% IV SOLN
INTRAVENOUS | Status: DC | PRN
  Administered 2015-03-10: 3 ug/kg/min via INTRAVENOUS

## 2015-03-10 MED ORDER — ROCURONIUM BROMIDE 100 MG/10ML IV SOLN
INTRAVENOUS | Status: DC | PRN
  Administered 2015-03-10: 50 mg via INTRAVENOUS
  Administered 2015-03-10: 100 mg via INTRAVENOUS
  Administered 2015-03-10 (×3): 50 mg via INTRAVENOUS

## 2015-03-10 MED FILL — Magnesium Sulfate Inj 50%: INTRAMUSCULAR | Qty: 10 | Status: AC

## 2015-03-10 MED FILL — Potassium Chloride Inj 2 mEq/ML: INTRAVENOUS | Qty: 40 | Status: AC

## 2015-03-10 MED FILL — Heparin Sodium (Porcine) Inj 1000 Unit/ML: INTRAMUSCULAR | Qty: 30 | Status: AC

## 2015-03-10 SURGICAL SUPPLY — 108 items
ADAPTER CARDIO PERF ANTE/RETRO (ADAPTER) ×4 IMPLANT
APPLICATOR COTTON TIP 6IN STRL (MISCELLANEOUS) ×4 IMPLANT
BAG DECANTER FOR FLEXI CONT (MISCELLANEOUS) ×4 IMPLANT
BANDAGE ACE 4X5 VEL STRL LF (GAUZE/BANDAGES/DRESSINGS) ×8 IMPLANT
BANDAGE ACE 6X5 VEL STRL LF (GAUZE/BANDAGES/DRESSINGS) ×8 IMPLANT
BANDAGE ELASTIC 4 VELCRO ST LF (GAUZE/BANDAGES/DRESSINGS) ×4 IMPLANT
BANDAGE ELASTIC 6 VELCRO ST LF (GAUZE/BANDAGES/DRESSINGS) ×4 IMPLANT
BASKET HEART  (ORDER IN 25'S) (MISCELLANEOUS) ×1
BASKET HEART (ORDER IN 25'S) (MISCELLANEOUS) ×1
BASKET HEART (ORDER IN 25S) (MISCELLANEOUS) ×2 IMPLANT
BENZOIN TINCTURE PRP APPL 2/3 (GAUZE/BANDAGES/DRESSINGS) ×4 IMPLANT
BLADE NEEDLE 3 SS STRL (BLADE) ×3 IMPLANT
BLADE NEEDLE 3MM SS STRL (BLADE) ×1
BLADE STERNUM SYSTEM 6 (BLADE) ×4 IMPLANT
BLADE SURG 11 STRL SS (BLADE) ×4 IMPLANT
BLADE SURG 12 STRL SS (BLADE) ×4 IMPLANT
BLADE SURG ROTATE 9660 (MISCELLANEOUS) ×4 IMPLANT
BNDG GAUZE ELAST 4 BULKY (GAUZE/BANDAGES/DRESSINGS) ×8 IMPLANT
CANISTER SUCTION 2500CC (MISCELLANEOUS) ×4 IMPLANT
CANNULA GUNDRY RCSP 15FR (MISCELLANEOUS) ×4 IMPLANT
CANNULA VESSEL 3MM BLUNT TIP (CANNULA) ×4 IMPLANT
CATH CPB KIT VANTRIGT (MISCELLANEOUS) ×4 IMPLANT
CATH ROBINSON RED A/P 18FR (CATHETERS) ×12 IMPLANT
CATH THORACIC 36FR RT ANG (CATHETERS) ×4 IMPLANT
CLIP FOGARTY SPRING 6M (CLIP) ×8 IMPLANT
CLIP RETRACTION 3.0MM CORONARY (MISCELLANEOUS) ×4 IMPLANT
CLIP TI WIDE RED SMALL 24 (CLIP) ×12 IMPLANT
COVER SURGICAL LIGHT HANDLE (MISCELLANEOUS) ×4 IMPLANT
CRADLE DONUT ADULT HEAD (MISCELLANEOUS) ×4 IMPLANT
DERMABOND ADHESIVE PROPEN (GAUZE/BANDAGES/DRESSINGS) ×2
DERMABOND ADVANCED .7 DNX6 (GAUZE/BANDAGES/DRESSINGS) ×2 IMPLANT
DRAIN CHANNEL 32F RND 10.7 FF (WOUND CARE) ×4 IMPLANT
DRAPE CARDIOVASCULAR INCISE (DRAPES) ×2
DRAPE SLUSH/WARMER DISC (DRAPES) ×4 IMPLANT
DRAPE SRG 135X102X78XABS (DRAPES) ×2 IMPLANT
DRSG AQUACEL AG ADV 3.5X14 (GAUZE/BANDAGES/DRESSINGS) ×4 IMPLANT
ELECT BLADE 4.0 EZ CLEAN MEGAD (MISCELLANEOUS) ×4
ELECT BLADE 6.5 EXT (BLADE) ×4 IMPLANT
ELECT CAUTERY BLADE 6.4 (BLADE) ×4 IMPLANT
ELECT REM PT RETURN 9FT ADLT (ELECTROSURGICAL) ×8
ELECTRODE BLDE 4.0 EZ CLN MEGD (MISCELLANEOUS) ×2 IMPLANT
ELECTRODE REM PT RTRN 9FT ADLT (ELECTROSURGICAL) ×4 IMPLANT
FELT TEFLON 1X6 (MISCELLANEOUS) ×4 IMPLANT
GAUZE SPONGE 4X4 12PLY STRL (GAUZE/BANDAGES/DRESSINGS) ×8 IMPLANT
GAUZE SPONGE 4X4 16PLY XRAY LF (GAUZE/BANDAGES/DRESSINGS) ×8 IMPLANT
GLOVE BIO SURGEON STRL SZ 6.5 (GLOVE) ×18 IMPLANT
GLOVE BIO SURGEON STRL SZ7 (GLOVE) ×24 IMPLANT
GLOVE BIO SURGEON STRL SZ7.5 (GLOVE) ×12 IMPLANT
GLOVE BIO SURGEONS STRL SZ 6.5 (GLOVE) ×6
GOWN STRL REUS W/ TWL LRG LVL3 (GOWN DISPOSABLE) ×18 IMPLANT
GOWN STRL REUS W/TWL LRG LVL3 (GOWN DISPOSABLE) ×18
HEMOSTAT POWDER SURGIFOAM 1G (HEMOSTASIS) ×12 IMPLANT
HEMOSTAT SURGICEL 2X14 (HEMOSTASIS) ×8 IMPLANT
INSERT FOGARTY XLG (MISCELLANEOUS) IMPLANT
KIT BASIN OR (CUSTOM PROCEDURE TRAY) ×4 IMPLANT
KIT ROOM TURNOVER OR (KITS) ×4 IMPLANT
KIT SUCTION CATH 14FR (SUCTIONS) ×4 IMPLANT
KIT VASOVIEW W/TROCAR VH 2000 (KITS) ×4 IMPLANT
LEAD PACING MYOCARDI (MISCELLANEOUS) ×4 IMPLANT
MARKER GRAFT CORONARY BYPASS (MISCELLANEOUS) ×12 IMPLANT
NS IRRIG 1000ML POUR BTL (IV SOLUTION) ×24 IMPLANT
PACK OPEN HEART (CUSTOM PROCEDURE TRAY) ×4 IMPLANT
PAD ARMBOARD 7.5X6 YLW CONV (MISCELLANEOUS) ×8 IMPLANT
PAD ELECT DEFIB RADIOL ZOLL (MISCELLANEOUS) ×4 IMPLANT
PENCIL BUTTON HOLSTER BLD 10FT (ELECTRODE) ×4 IMPLANT
PUNCH AORTIC ROTATE 4.0MM (MISCELLANEOUS) ×4 IMPLANT
PUNCH AORTIC ROTATE 4.5MM 8IN (MISCELLANEOUS) IMPLANT
PUNCH AORTIC ROTATE 5MM 8IN (MISCELLANEOUS) IMPLANT
SET CARDIOPLEGIA MPS 5001102 (MISCELLANEOUS) ×4 IMPLANT
SOLUTION ANTI FOG 6CC (MISCELLANEOUS) ×4 IMPLANT
SPONGE GAUZE 4X4 12PLY STER LF (GAUZE/BANDAGES/DRESSINGS) ×4 IMPLANT
SPONGE LAP 18X18 X RAY DECT (DISPOSABLE) ×4 IMPLANT
SPONGE LAP 4X18 X RAY DECT (DISPOSABLE) ×4 IMPLANT
SURGIFLO W/THROMBIN 8M KIT (HEMOSTASIS) ×4 IMPLANT
SUT BONE WAX W31G (SUTURE) ×4 IMPLANT
SUT MNCRL AB 4-0 PS2 18 (SUTURE) ×4 IMPLANT
SUT PROLENE 3 0 SH DA (SUTURE) ×8 IMPLANT
SUT PROLENE 3 0 SH1 36 (SUTURE) IMPLANT
SUT PROLENE 4 0 RB 1 (SUTURE) ×2
SUT PROLENE 4 0 SH DA (SUTURE) ×16 IMPLANT
SUT PROLENE 4-0 RB1 .5 CRCL 36 (SUTURE) ×2 IMPLANT
SUT PROLENE 5 0 C 1 36 (SUTURE) IMPLANT
SUT PROLENE 6 0 C 1 30 (SUTURE) ×4 IMPLANT
SUT PROLENE 6 0 CC (SUTURE) ×24 IMPLANT
SUT PROLENE 8 0 BV175 6 (SUTURE) ×12 IMPLANT
SUT PROLENE BLUE 7 0 (SUTURE) ×16 IMPLANT
SUT SILK  1 MH (SUTURE)
SUT SILK 1 MH (SUTURE) IMPLANT
SUT SILK 2 0 SH CR/8 (SUTURE) ×4 IMPLANT
SUT SILK 3 0 SH CR/8 (SUTURE) ×4 IMPLANT
SUT STEEL 6MS V (SUTURE) ×4 IMPLANT
SUT STEEL SZ 6 DBL 3X14 BALL (SUTURE) ×8 IMPLANT
SUT VIC AB 1 CTX 36 (SUTURE) ×4
SUT VIC AB 1 CTX36XBRD ANBCTR (SUTURE) ×4 IMPLANT
SUT VIC AB 2-0 CT1 27 (SUTURE) ×4
SUT VIC AB 2-0 CT1 TAPERPNT 27 (SUTURE) ×4 IMPLANT
SUT VIC AB 2-0 CTX 27 (SUTURE) IMPLANT
SUT VIC AB 3-0 X1 27 (SUTURE) IMPLANT
SUTURE E-PAK OPEN HEART (SUTURE) ×4 IMPLANT
SYSTEM SAHARA CHEST DRAIN ATS (WOUND CARE) ×4 IMPLANT
TAPE CLOTH SURG 4X10 WHT LF (GAUZE/BANDAGES/DRESSINGS) ×4 IMPLANT
TAPE PAPER 2X10 WHT MICROPORE (GAUZE/BANDAGES/DRESSINGS) ×4 IMPLANT
TOWEL OR 17X24 6PK STRL BLUE (TOWEL DISPOSABLE) ×8 IMPLANT
TOWEL OR 17X26 10 PK STRL BLUE (TOWEL DISPOSABLE) ×8 IMPLANT
TRAY FOLEY IC TEMP SENS 16FR (CATHETERS) ×4 IMPLANT
TUBING INSUFFLATION (TUBING) ×4 IMPLANT
UNDERPAD 30X30 INCONTINENT (UNDERPADS AND DIAPERS) ×4 IMPLANT
WATER STERILE IRR 1000ML POUR (IV SOLUTION) ×8 IMPLANT

## 2015-03-10 NOTE — Anesthesia Procedure Notes (Addendum)
Central Venous Catheter Insertion Performed by: anesthesiologist 03/10/2015 7:15 AM Patient location: Pre-op. Preanesthetic checklist: patient identified, IV checked, site marked, risks and benefits discussed, surgical consent, monitors and equipment checked, pre-op evaluation, timeout performed and anesthesia consent Position: Trendelenburg Lidocaine 1% used for infiltration Landmarks identified and Seldinger technique used Catheter size: 9 Fr Central line was placed.MAC introducer Swan type and PA catheter depth:thermodilution and 50PA Cath depth:50 Procedure performed using ultrasound guided technique. Attempts: 1 Following insertion, line sutured and dressing applied. Post procedure assessment: blood return through all ports, free fluid flow and no air. Patient tolerated the procedure well with no immediate complications.   Procedure Name: Intubation Date/Time: 03/10/2015 7:56 AM Performed by: Carney Living Pre-anesthesia Checklist: Emergency Drugs available, Patient identified, Timeout performed, Suction available and Patient being monitored Patient Re-evaluated:Patient Re-evaluated prior to inductionOxygen Delivery Method: Circle system utilized Preoxygenation: Pre-oxygenation with 100% oxygen Intubation Type: IV induction Ventilation: Mask ventilation without difficulty Laryngoscope Size: Miller and 2 Grade View: Grade I Tube type: Oral Tube size: 8.0 mm Number of attempts: 1 Airway Equipment and Method: Stylet Placement Confirmation: ETT inserted through vocal cords under direct vision,  positive ETCO2 and breath sounds checked- equal and bilateral Secured at: 22 cm Tube secured with: Tape Dental Injury: Teeth and Oropharynx as per pre-operative assessment

## 2015-03-10 NOTE — Anesthesia Postprocedure Evaluation (Signed)
Anesthesia Post Note  Patient: [name] [name]  Procedure(s) Performed: Procedure(s) (LRB): CORONARY ARTERY BYPASS GRAFTING (CABG) x four  , using left internal mammary artery and bilateral greater saphenous veins harvested endoscopically (N/A) TRANSESOPHAGEAL ECHOCARDIOGRAM (TEE) (N/A)  Patient location during evaluation: SICU Anesthesia Type: General Level of consciousness: sedated Pain management: pain level controlled Vital Signs Assessment: post-procedure vital signs reviewed and stable Respiratory status: patient remains intubated per anesthesia plan Cardiovascular status: stable Anesthetic complications: no    Last Vitals:  Filed Vitals:   03/10/15 1530 03/10/15 1538  BP:    Pulse: 91 90  Temp: 35.8 C 35.8 C  Resp: 12 12    Last Pain: There were no vitals filed for this visit.               Vonna Brabson,W. EDMOND

## 2015-03-10 NOTE — Op Note (Signed)
NAMEWENDAL, [name] [name] NO.:  [id number]  MEDICAL RECORD NO.:  [id number]  LOCATION:  2S08C                        FACILITY:  [facility]  PHYSICIAN:  Ivin Poot, M.D.  DATE OF BIRTH:  1940/05/16  DATE OF PROCEDURE:  03/10/2015 DATE OF DISCHARGE:                              OPERATIVE REPORT   OPERATION: 1. Coronary artery bypass grafting x4 (left internal mammary artery to     left anterior descending coronary artery, saphenous vein graft to     diagonal, saphenous vein graft to obtuse marginal, saphenous vein     graft to distal circumflex posterolateral). 2. Endoscopic harvest of bilateral greater saphenous vein.  PREOPERATIVE DIAGNOSIS:  Severe three-vessel coronary artery disease with positive stress test and unstable angina.  POSTOPERATIVE DIAGNOSIS:  Severe three-vessel coronary artery disease with positive stress test and unstable angina.  ANESTHESIA:  General.  SURGEON:  Ivin Poot, MD  ASSISTANT:  Ellwood Handler, PA-C  CLINICAL NOTE:  The patient is a 75 year old, Caucasian male diabetic who presented with symptoms of accelerating angina and subsequently underwent cardiac catheterization as outpatient.  I examined the patient and discussed the results of the cardiac catheterization with the patient and his family.  His cardiologist recommended multivessel CABG for treatment of his left dominant chronically occluded LAD, 95% stenosis of a dominant circumflex, and 80% stenosis of the diagonal.  I discussed the indications, benefits, alternatives, and risks of the procedure.  He understood the risks of stroke, MI, bleeding, infection, blood transfusion, postoperative renal failure, postoperative pulmonary problems including pleural effusion, and death.  The patient understood that he was at increased risk because of his known carotid disease and had been assessed by his vascular surgeon, Dr. Oneida Alar, for his known carotid stenosis and was  recommended to proceed with CABG without combined carotid endarterectomy.  The patient's baseline creatinine is also elevated at 1.8 to 2.5 and he understood the risk of postoperative renal failure.  After reviewing these issues, he demonstrated understanding and agreed to proceed with surgery under informed consent.  OPERATIVE FINDINGS: 1. Enlarged heart with large amount of epicardial fat. 2. Diffusely diseased LAD which was a difficult target. 3. Poor conduit from the right leg, adequate conduit from the left leg     for saphenous vein.  OPERATIVE PROCEDURE:  The patient was brought to the operating placed supine on the operating table.  General anesthesia was induced under invasive hemodynamic monitoring.  The chest, abdomen, and legs were prepped with Betadine and draped as a sterile field.  A sternal incision was made as the saphenous vein was harvested endoscopically from both legs.  The internal mammary artery had excellent flow.  The sternal retractor was placed and the pericardium was opened and suspended. Pursestrings were placed in ascending aorta and right atrium and heparin was administered.  When the ACT was documented as being therapeutic, patient was cannulated and placed on cardiopulmonary bypass.  The coronaries were identified for grafting.  The mammary artery and vein grafts were prepared for the distal anastomoses and cardioplegia cannulas were placed both antegrade and retrograde cold blood cardioplegia.  The patient was cooled to 32 degrees.  The crossclamp was  applied.  A liter of cold blood cardioplegia was delivered in split doses between the antegrade aortic and retrograde coronary sinus catheters.  There was good cardioplegic arrest and septal temperature dropped less than 14 degrees.  Cardioplegia was delivered every 20 minutes while the crossclamp was in place.  The distal coronary anastomoses were performed.  The first distal anastomosis was to the  posterior descending branch of the distal circumflex.  This was a 1.5-mm vessel.  A reverse saphenous vein was sewn end-to-side with running 7-0 Prolene with good flow through the graft.  Cardioplegia was redosed.  The second distal anastomosis was the OM1 branch of the circumflex. This had a proximal 80% stenosis.  A reverse saphenous vein was sewn end- to-side to this 1.5-mm vessel with running 7-0 Prolene.  There was good flow through the graft.  Cardioplegia was redosed.  The third distal anastomosis was the diagonal branch to the LAD.  This had a proximal 80% stenosis.  A reverse saphenous vein was sewn end-to- side with running 7-0 Prolene with good flow through the graft and cardioplegia was redosed.  The fourth distal anastomosis was placed to the distal LAD.  This was totally occluded proximally and filled via collaterals.  It was also diffusely diseased, but graftable.  The left IMA pedicle was brought through an opening and the left lateral pericardium was brought down onto the LAD and sewn end-to-side with running 8-0 Prolene.  There was good flow through the anastomosis.  After briefly releasing the pedicle bulldog on the mammary artery.  The bulldog was reapplied and the pedicle was secured to the epicardium.  Cardioplegia was redosed.  The crossclamp was still in place, 3 proximal vein anastomoses were performed on the ascending aorta using a 4.5 mm punch running 6-0 Prolene.  Prior to removing the cross-clamp, air was vented from the coronaries with a dose of retrograde warm blood cardioplegia.  The cross- clamp was removed.  The heart resumed a spontaneous rhythm.  The vein grafts were de-aired and opened, each had good flow.  Hemostasis was documented at the proximal and distal anastomoses.  The cardioplegic cannulas were removed.  Temporary pacing wires were applied.  The patient was rewarmed and reperfused.  The lungs were expanded.  When the patient was  adequately reperfused, he was weaned off cardiopulmonary bypass without difficulty.  Echo showed good LV function.  Protamine was administered without adverse reaction.  The cannulas were removed. There was some diffuse bleeding after reversal of heparin and the patient was given FFP with improved coagulopathy.  The superior pericardial fat was closed over the aorta and vein grafts. Anterior and posterior mediastinal tubes and a left pleural tube were placed and brought out through separate incisions.  The sternum was closed with wire.  The incision was irrigated with warm saline.  The pectoralis fascia was closed with a running #1 Vicryl.  The subcutaneous and skin layers were closed in running Vicryl.  Sterile dressings were applied.  The patient was transferred to the ICU in stable condition.  Total cardiopulmonary bypass time was 140 minutes sign.     Ivin Poot, M.D.     PV/MEDQ  D:  03/10/2015  T:  03/10/2015  Job:  [id number]

## 2015-03-10 NOTE — Brief Op Note (Signed)
03/10/2015  12:21 PM  PATIENT:  Percell Belt  75 y.o. male  PRE-OPERATIVE DIAGNOSIS:  CAD  POST-OPERATIVE DIAGNOSIS:  CAD  PROCEDURE:  Procedure(s):  CORONARY ARTERY BYPASS GRAFTING x 4 -LIMA to LAD -SVG to DIAGONAL -SVG to OM1 -SVG to PDA  ENDOSCOPIC HARVEST GREATER SAPHENOUS VEIN  -Right Leg, Left Thigh  TRANSESOPHAGEAL ECHOCARDIOGRAM (TEE) (N/A)  SURGEON:  Surgeon(s) and Role:    * Ivin Poot, MD - Primary  PHYSICIAN ASSISTANT: Ellwood Handler PA-C  ANESTHESIA:   general  EBL:  Total I/O In: 600 [I.V.:600] Out: 1150 [Urine:1150]  BLOOD ADMINISTERED:CELLSAVER  DRAINS: Left Pleural chest tube, Mediastinal Chest drain   LOCAL MEDICATIONS USED:  NONE  SPECIMEN:  No Specimen  DISPOSITION OF SPECIMEN:  N/A  COUNTS:  YES  TOURNIQUET:  * No tourniquets in log *  DICTATION: .Dragon Dictation  PLAN OF CARE: Admit to inpatient   PATIENT DISPOSITION:  ICU - intubated and hemodynamically stable.   Delay start of Pharmacological VTE agent (>24hrs) due to surgical blood loss or risk of bleeding: yes

## 2015-03-10 NOTE — Progress Notes (Signed)
The patient was examined and preop studies reviewed. There has been no change from the prior exam and the patient is ready for surgery.  plan CABG in this 75 yo male with multiple co-morbidities

## 2015-03-10 NOTE — Procedures (Signed)
Extubation Procedure Note  Patient Details:   Name: [name] [name] DOB: January 30, 1940 MRN: [id number]  Patient met the requirements to wean NIF -22 and VC 800 with good effort There was no stridor noted after extubation Patient was able to get 550 on IS. Patient placed on 4L [place].    Evaluation  O2 sats: stable throughout Complications: No apparent complications Patient did tolerate procedure well. Bilateral Breath Sounds: Clear   Yes  Kelle Darting 03/10/2015, 2320

## 2015-03-10 NOTE — Progress Notes (Signed)
  Echocardiogram Echocardiogram Transesophageal has been performed.  Bobbye Charleston 03/10/2015, 9:46 AM

## 2015-03-10 NOTE — OR Nursing (Signed)
Twenty minute call to SICU at 1359. Spoke to [place].

## 2015-03-10 NOTE — Progress Notes (Addendum)
[facility] BRIEF SICU PROGRESS NOTE  Day of Surgery  S/P Procedure(s) (LRB): CORONARY ARTERY BYPASS GRAFTING (CABG) x four  , using left internal mammary artery and bilateral greater saphenous veins harvested endoscopically (N/A) TRANSESOPHAGEAL ECHOCARDIOGRAM (TEE) (N/A)   Sedated on vent AV paced with stable hemodynamics on Neo and Dopamine drips Chest tube output low UOP > 100 ml/hr Labs okay  Plan: Continue routine early postop  Rexene Alberts, MD 03/10/2015 7:10 PM

## 2015-03-10 NOTE — Transfer of Care (Signed)
Immediate Anesthesia Transfer of Care Note  Patient: [name] [name]  Procedure(s) Performed: Procedure(s): CORONARY ARTERY BYPASS GRAFTING (CABG) x four  , using left internal mammary artery and bilateral greater saphenous veins harvested endoscopically (N/A) TRANSESOPHAGEAL ECHOCARDIOGRAM ([name]) (N/A)  Patient Location: SICU  Anesthesia Type:General  Level of Consciousness: sedated and Patient remains intubated per anesthesia plan  Airway & Oxygen Therapy: Patient remains intubated per anesthesia plan and Patient placed on Ventilator (see vital sign flow sheet for setting)  Post-op Assessment: Report given to RN and Post -op Vital signs reviewed and stable  Post vital signs: Reviewed and stable  Last Vitals:  Filed Vitals:   03/10/15 0601  BP: 122/62  Pulse: 73  Temp: 123XX123 C    Complications: No apparent anesthesia complications

## 2015-03-10 NOTE — OR Nursing (Signed)
Forty-five minute call to SICU charge nurse at 1320. Spoke with Patty.

## 2015-03-11 ENCOUNTER — Encounter (HOSPITAL_COMMUNITY): Payer: Self-pay | Admitting: Cardiothoracic Surgery

## 2015-03-11 ENCOUNTER — Inpatient Hospital Stay (HOSPITAL_COMMUNITY): Payer: Medicare HMO

## 2015-03-11 LAB — GLUCOSE, CAPILLARY
Glucose-Capillary: 104 mg/dL — ABNORMAL HIGH (ref 65–99)
Glucose-Capillary: 108 mg/dL — ABNORMAL HIGH (ref 65–99)
Glucose-Capillary: 111 mg/dL — ABNORMAL HIGH (ref 65–99)
Glucose-Capillary: 111 mg/dL — ABNORMAL HIGH (ref 65–99)
Glucose-Capillary: 112 mg/dL — ABNORMAL HIGH (ref 65–99)
Glucose-Capillary: 112 mg/dL — ABNORMAL HIGH (ref 65–99)
Glucose-Capillary: 121 mg/dL — ABNORMAL HIGH (ref 65–99)
Glucose-Capillary: 121 mg/dL — ABNORMAL HIGH (ref 65–99)
Glucose-Capillary: 126 mg/dL — ABNORMAL HIGH (ref 65–99)
Glucose-Capillary: 132 mg/dL — ABNORMAL HIGH (ref 65–99)
Glucose-Capillary: 147 mg/dL — ABNORMAL HIGH (ref 65–99)
Glucose-Capillary: 148 mg/dL — ABNORMAL HIGH (ref 65–99)
Glucose-Capillary: 165 mg/dL — ABNORMAL HIGH (ref 65–99)
Glucose-Capillary: 178 mg/dL — ABNORMAL HIGH (ref 65–99)
Glucose-Capillary: 191 mg/dL — ABNORMAL HIGH (ref 65–99)
Glucose-Capillary: 70 mg/dL (ref 65–99)
Glucose-Capillary: 99 mg/dL (ref 65–99)

## 2015-03-11 LAB — PREPARE FRESH FROZEN PLASMA
Unit division: 0
Unit division: 0

## 2015-03-11 LAB — POCT I-STAT, CHEM 8
BUN: 36 mg/dL — ABNORMAL HIGH (ref 6–20)
Calcium, Ion: 1.13 mmol/L (ref 1.13–1.30)
Chloride: 108 mmol/L (ref 101–111)
Creatinine, Ser: 2 mg/dL — ABNORMAL HIGH (ref 0.61–1.24)
Glucose, Bld: 197 mg/dL — ABNORMAL HIGH (ref 65–99)
HCT: 30 % — ABNORMAL LOW (ref 39.0–52.0)
Hemoglobin: 10.2 g/dL — ABNORMAL LOW (ref 13.0–17.0)
Potassium: 5.4 mmol/L — ABNORMAL HIGH (ref 3.5–5.1)
Sodium: 139 mmol/L (ref 135–145)
TCO2: 22 mmol/L (ref 0–100)

## 2015-03-11 LAB — POCT I-STAT 3, ART BLOOD GAS (G3+)
Acid-base deficit: 4 mmol/L — ABNORMAL HIGH (ref 0.0–2.0)
Bicarbonate: 21.4 mEq/L (ref 20.0–24.0)
O2 Saturation: 87 %
Patient temperature: 37.2
TCO2: 23 mmol/L (ref 0–100)
pCO2 arterial: 41 mmHg (ref 35.0–45.0)
pH, Arterial: 7.327 — ABNORMAL LOW (ref 7.350–7.450)
pO2, Arterial: 57 mmHg — ABNORMAL LOW (ref 80.0–100.0)

## 2015-03-11 LAB — BASIC METABOLIC PANEL
Anion gap: 4 — ABNORMAL LOW (ref 5–15)
BUN: 23 mg/dL — AB (ref 6–20)
CO2: 23 mmol/L (ref 22–32)
CREATININE: 1.94 mg/dL — AB (ref 0.61–1.24)
Calcium: 8.4 mg/dL — ABNORMAL LOW (ref 8.9–10.3)
Chloride: 115 mmol/L — ABNORMAL HIGH (ref 101–111)
GFR calc Af Amer: 37 mL/min — ABNORMAL LOW (ref >60)
GFR, EST NON AFRICAN AMERICAN: 32 mL/min — AB (ref >60)
GLUCOSE: 115 mg/dL — AB (ref 65–99)
Potassium: 4.5 mmol/L (ref 3.5–5.1)
SODIUM: 142 mmol/L (ref 135–145)

## 2015-03-11 LAB — CBC
HCT: 29.9 % — ABNORMAL LOW (ref 39.0–52.0)
HCT: 30.8 % — ABNORMAL LOW (ref 39.0–52.0)
HEMOGLOBIN: 10.2 g/dL — AB (ref 13.0–17.0)
Hemoglobin: 9.6 g/dL — ABNORMAL LOW (ref 13.0–17.0)
MCH: 30.6 pg (ref 26.0–34.0)
MCH: 31.9 pg (ref 26.0–34.0)
MCHC: 32.1 g/dL (ref 30.0–36.0)
MCHC: 33.1 g/dL (ref 30.0–36.0)
MCV: 95.2 fL (ref 78.0–100.0)
MCV: 96.3 fL (ref 78.0–100.0)
PLATELETS: 215 10*3/uL (ref 150–400)
PLATELETS: 184 10*3/uL (ref 150–400)
RBC: 3.14 MIL/uL — ABNORMAL LOW (ref 4.22–5.81)
RBC: 3.2 MIL/uL — ABNORMAL LOW (ref 4.22–5.81)
RDW: 12.9 % (ref 11.5–15.5)
RDW: 13.2 % (ref 11.5–15.5)
WBC: 14.6 10*3/uL — ABNORMAL HIGH (ref 4.0–10.5)
WBC: 17.1 10*3/uL — ABNORMAL HIGH (ref 4.0–10.5)

## 2015-03-11 LAB — MAGNESIUM
MAGNESIUM: 1.9 mg/dL (ref 1.7–2.4)
Magnesium: 1.8 mg/dL (ref 1.7–2.4)

## 2015-03-11 LAB — TYPE AND SCREEN
ABO/RH(D): O POS
Antibody Screen: NEGATIVE

## 2015-03-11 LAB — CREATININE, SERUM
CREATININE: 2.25 mg/dL — AB (ref 0.61–1.24)
GFR calc Af Amer: 31 mL/min — ABNORMAL LOW (ref >60)
GFR calc non Af Amer: 27 mL/min — ABNORMAL LOW (ref >60)

## 2015-03-11 MED ORDER — FUROSEMIDE 10 MG/ML IJ SOLN
40.0000 mg | Freq: Once | INTRAMUSCULAR | Status: AC
  Administered 2015-03-11: 40 mg via INTRAVENOUS
  Filled 2015-03-11: qty 4

## 2015-03-11 MED ORDER — AMIODARONE HCL IN DEXTROSE 360-4.14 MG/200ML-% IV SOLN
60.0000 mg/h | INTRAVENOUS | Status: AC
  Administered 2015-03-11: 60 mg/h via INTRAVENOUS
  Filled 2015-03-11 (×2): qty 200

## 2015-03-11 MED ORDER — DEXTROSE 50 % IV SOLN
INTRAVENOUS | Status: AC
  Filled 2015-03-11: qty 50

## 2015-03-11 MED ORDER — PNEUMOCOCCAL VAC POLYVALENT 25 MCG/0.5ML IJ INJ
0.5000 mL | INJECTION | Freq: Once | INTRAMUSCULAR | Status: AC
  Administered 2015-03-24: 0.5 mL via INTRAMUSCULAR
  Filled 2015-03-11 (×3): qty 0.5

## 2015-03-11 MED ORDER — CETYLPYRIDINIUM CHLORIDE 0.05 % MT LIQD
7.0000 mL | Freq: Two times a day (BID) | OROMUCOSAL | Status: DC
  Administered 2015-03-11 – 2015-03-25 (×20): 7 mL via OROMUCOSAL

## 2015-03-11 MED ORDER — AMIODARONE LOAD VIA INFUSION
150.0000 mg | Freq: Once | INTRAVENOUS | Status: AC
  Administered 2015-03-11: 150 mg via INTRAVENOUS
  Filled 2015-03-11: qty 83.34

## 2015-03-11 MED ORDER — ALBUMIN HUMAN 25 % IV SOLN
12.5000 g | Freq: Once | INTRAVENOUS | Status: AC
  Administered 2015-03-11: 12.5 g via INTRAVENOUS
  Filled 2015-03-11: qty 50

## 2015-03-11 MED ORDER — AMIODARONE HCL IN DEXTROSE 360-4.14 MG/200ML-% IV SOLN
30.0000 mg/h | INTRAVENOUS | Status: AC
  Administered 2015-03-12 (×2): 30 mg/h via INTRAVENOUS
  Filled 2015-03-11: qty 200

## 2015-03-11 MED ORDER — DEXTROSE 50 % IV SOLN
12.0000 mL | Freq: Once | INTRAVENOUS | Status: AC
  Administered 2015-03-11: 12 mL via INTRAVENOUS

## 2015-03-11 MED ORDER — INSULIN ASPART 100 UNIT/ML ~~LOC~~ SOLN
0.0000 [IU] | SUBCUTANEOUS | Status: DC
  Administered 2015-03-11 (×2): 4 [IU] via SUBCUTANEOUS
  Administered 2015-03-11 – 2015-03-12 (×2): 2 [IU] via SUBCUTANEOUS
  Administered 2015-03-12: 4 [IU] via SUBCUTANEOUS
  Administered 2015-03-12 (×2): 2 [IU] via SUBCUTANEOUS
  Administered 2015-03-12: 4 [IU] via SUBCUTANEOUS
  Administered 2015-03-13 (×2): 2 [IU] via SUBCUTANEOUS
  Administered 2015-03-14: 4 [IU] via SUBCUTANEOUS
  Administered 2015-03-15 (×2): 2 [IU] via SUBCUTANEOUS
  Administered 2015-03-16: 4 [IU] via SUBCUTANEOUS

## 2015-03-11 MED ORDER — INSULIN DETEMIR 100 UNIT/ML ~~LOC~~ SOLN
12.0000 [IU] | Freq: Two times a day (BID) | SUBCUTANEOUS | Status: DC
  Administered 2015-03-11 – 2015-03-25 (×29): 12 [IU] via SUBCUTANEOUS
  Filled 2015-03-11 (×31): qty 0.12

## 2015-03-11 MED FILL — Electrolyte-R (PH 7.4) Solution: INTRAVENOUS | Qty: 4000 | Status: AC

## 2015-03-11 MED FILL — Mannitol IV Soln 20%: INTRAVENOUS | Qty: 500 | Status: AC

## 2015-03-11 MED FILL — Sodium Bicarbonate IV Soln 8.4%: INTRAVENOUS | Qty: 50 | Status: AC

## 2015-03-11 MED FILL — Heparin Sodium (Porcine) Inj 1000 Unit/ML: INTRAMUSCULAR | Qty: 10 | Status: AC

## 2015-03-11 MED FILL — Sodium Chloride IV Soln 0.9%: INTRAVENOUS | Qty: 2000 | Status: AC

## 2015-03-11 MED FILL — Lidocaine HCl IV Inj 20 MG/ML: INTRAVENOUS | Qty: 5 | Status: AC

## 2015-03-11 NOTE — Progress Notes (Signed)
CT surgery p.m. Rounds  Patient had a stable day was able to be mobilized out of bed to chair Earlier had slow sinus rhythm now developing atrial fibrillation and will start amiodarone protocol Patient has chronic renal insufficiency, creatinine this p.m. 2.0 is at baseline Continue renal dose dopamine Follow urine output-Lasix ordered this p.m. after 1 dose of albumin

## 2015-03-11 NOTE — Care Management Note (Signed)
Case Management Note  Patient Details  Name: [name] [name] MRN: [id number] Date of Birth: 12/05/1940  Subjective/Objective:    Pt OOB to chair, feeling well.  Friend is present but pt states he lives alone and knows he will need some rehab before returning home.  Pt is from [place] and states he has been given names of facilities in his area that provide good care.  Will consult CSW.                    Expected Discharge Plan:  Skilled Nursing Facility  In-House Referral:  Clinical Social Work  Discharge planning Services  CM Consult  Status of Service:  In process, will continue to follow  Girard Cooter, RN 03/11/2015, 1:53 PM

## 2015-03-11 NOTE — Progress Notes (Signed)
1 Day Post-Op Procedure(s) (LRB): CORONARY ARTERY BYPASS GRAFTING (CABG) x four  , using left internal mammary artery and bilateral greater saphenous veins harvested endoscopically (N/A) TRANSESOPHAGEAL ECHOCARDIOGRAM (TEE) (N/A) Subjective: Patient feels well after multivessel CABG yesterday Stable hemodynamics, A-V. Pacing History chronic renal failure on renal dose dopamine Chest x-ray fairly clear with minimal basilar atelectasis Neuro intact  Objective: Vital signs in last 24 hours: Temp:  [96.4 F (35.8 C)-99.7 F (37.6 C)] 99.1 F (37.3 C) (03/08 0900) Pulse Rate:  [74-91] 80 (03/08 0900) Cardiac Rhythm:  [-] A-V Sequential paced (03/08 0800) Resp:  [9-32] 25 (03/08 0900) BP: (82-152)/(57-74) 92/57 mmHg (03/08 0900) SpO2:  [89 %-100 %] 98 % (03/08 0900) Arterial Line BP: (89-155)/(42-69) 114/53 mmHg (03/08 0900) FiO2 (%):  [40 %-60 %] 40 % (03/07 2258) Weight:  [217 lb 13 oz (98.8 kg)] 217 lb 13 oz (98.8 kg) (03/08 0500)  Hemodynamic parameters for last 24 hours: PAP: (22-43)/(9-25) 37/18 mmHg CO:  [3.6 L/min-5.5 L/min] 4.5 L/min CI:  [1.6 L/min/m2-2.6 L/min/m2] 2.1 L/min/m2  Intake/Output from previous day: 03/07 0701 - 03/08 0700 In: 6289.9 [I.V.:3696.9; Blood:1043; IV Piggyback:1550] Out: [id number] [Urine:4155; Blood:1350; Chest Tube:440] Intake/Output this shift: Total I/O In: 241.2 [I.V.:241.2] Out: 170 [Urine:110; Chest Tube:60]       Exam    General- alert and comfortable   Lungs- clear without rales, wheezes   Cor- regular rate and rhythm, no murmur , gallop   Abdomen- soft, non-tender   Extremities - warm, non-tender, minimal edema   Neuro- oriented, appropriate, no focal weakness   Lab Results:  Recent Labs  03/10/15 2111 03/10/15 2140 03/11/15 0435  WBC 12.7*  --  14.6*  HGB 9.4* 9.5* 9.6*  HCT 27.9* 28.0* 29.9*  PLT 167  --  215   BMET:  Recent Labs  03/09/15 0827  03/10/15 2140 03/11/15 0435  NA 140  < > 143 142  K 5.2*  < > 4.3 4.5   CL 111  < > 108 115*  CO2 20*  --   --  23  GLUCOSE 162*  < > 128* 115*  BUN 28*  < > 25* 23*  CREATININE 1.92*  < > 1.80* 1.94*  CALCIUM 8.9  --   --  8.4*  < > = values in this interval not displayed.  PT/INR:  Recent Labs  03/10/15 1445  LABPROT 18.0*  INR 1.48   ABG    Component Value Date/Time   PHART 7.327* 03/11/2015 0030   HCO3 21.4 03/11/2015 0030   TCO2 23 03/11/2015 0030   ACIDBASEDEF 4.0* 03/11/2015 0030   O2SAT 87.0 03/11/2015 0030   CBG (last 3)   Recent Labs  03/11/15 0427 03/11/15 0614 03/11/15 0753  GLUCAP 104* 99 70    Assessment/Plan: S/P Procedure(s) (LRB): CORONARY ARTERY BYPASS GRAFTING (CABG) x four  , using left internal mammary artery and bilateral greater saphenous veins harvested endoscopically (N/A) TRANSESOPHAGEAL ECHOCARDIOGRAM (TEE) (N/A) Mobilize Diuresis Diabetes control d/c tubes/lines Continue renal dose dopamine and monitor creatinine   LOS: 1 day    [name] [name] 03/11/2015

## 2015-03-12 ENCOUNTER — Inpatient Hospital Stay (HOSPITAL_COMMUNITY): Payer: Medicare HMO

## 2015-03-12 LAB — GLUCOSE, CAPILLARY
Glucose-Capillary: 102 mg/dL — ABNORMAL HIGH (ref 65–99)
Glucose-Capillary: 123 mg/dL — ABNORMAL HIGH (ref 65–99)
Glucose-Capillary: 125 mg/dL — ABNORMAL HIGH (ref 65–99)
Glucose-Capillary: 147 mg/dL — ABNORMAL HIGH (ref 65–99)
Glucose-Capillary: 173 mg/dL — ABNORMAL HIGH (ref 65–99)
Glucose-Capillary: 183 mg/dL — ABNORMAL HIGH (ref 65–99)
Glucose-Capillary: 91 mg/dL (ref 65–99)

## 2015-03-12 LAB — BASIC METABOLIC PANEL
Anion gap: 12 (ref 5–15)
BUN: 27 mg/dL — ABNORMAL HIGH (ref 6–20)
CO2: 22 mmol/L (ref 22–32)
Calcium: 8.6 mg/dL — ABNORMAL LOW (ref 8.9–10.3)
Chloride: 106 mmol/L (ref 101–111)
Creatinine, Ser: 2.48 mg/dL — ABNORMAL HIGH (ref 0.61–1.24)
GFR calc Af Amer: 27 mL/min — ABNORMAL LOW (ref >60)
GFR calc non Af Amer: 24 mL/min — ABNORMAL LOW (ref >60)
Glucose, Bld: 158 mg/dL — ABNORMAL HIGH (ref 65–99)
Potassium: 4.4 mmol/L (ref 3.5–5.1)
Sodium: 140 mmol/L (ref 135–145)

## 2015-03-12 LAB — CBC
HCT: 30.1 % — ABNORMAL LOW (ref 39.0–52.0)
Hemoglobin: 9.8 g/dL — ABNORMAL LOW (ref 13.0–17.0)
MCH: 31.5 pg (ref 26.0–34.0)
MCHC: 32.6 g/dL (ref 30.0–36.0)
MCV: 96.8 fL (ref 78.0–100.0)
Platelets: 212 10*3/uL (ref 150–400)
RBC: 3.11 MIL/uL — ABNORMAL LOW (ref 4.22–5.81)
RDW: 13.4 % (ref 11.5–15.5)
WBC: 16.1 10*3/uL — ABNORMAL HIGH (ref 4.0–10.5)

## 2015-03-12 LAB — POCT I-STAT, CHEM 8
BUN: 31 mg/dL — ABNORMAL HIGH (ref 6–20)
Calcium, Ion: 1.23 mmol/L (ref 1.13–1.30)
Chloride: 101 mmol/L (ref 101–111)
Creatinine, Ser: 2.5 mg/dL — ABNORMAL HIGH (ref 0.61–1.24)
Glucose, Bld: 275 mg/dL — ABNORMAL HIGH (ref 65–99)
HCT: 30 % — ABNORMAL LOW (ref 39.0–52.0)
Hemoglobin: 10.2 g/dL — ABNORMAL LOW (ref 13.0–17.0)
Potassium: 4.3 mmol/L (ref 3.5–5.1)
Sodium: 136 mmol/L (ref 135–145)
TCO2: 21 mmol/L (ref 0–100)

## 2015-03-12 MED ORDER — AMIODARONE HCL 200 MG PO TABS
200.0000 mg | ORAL_TABLET | Freq: Two times a day (BID) | ORAL | Status: DC
  Administered 2015-03-12 – 2015-03-25 (×27): 200 mg via ORAL
  Filled 2015-03-12 (×27): qty 1

## 2015-03-12 NOTE — Progress Notes (Signed)
      [address]       [place] [address]             [phone]      PM ROUNDS  Up in chair eating dinner  BP 102/58 mmHg  Pulse 71  Temp(Src) 98.4 F (36.9 C) (Oral)  Resp 20  Ht 5\' 11"  (1.803 m)  Wt 218 lb 14.7 oz (99.3 kg)  BMI 30.55 kg/m2  SpO2 93% Neo off currently  Intake/Output Summary (Last 24 hours) at 03/12/15 1814 Last data filed at 03/12/15 1800  Gross per 24 hour  Intake 4251.75 ml  Output   1810 ml  Net 2441.75 ml    Creatinine stable at 2.5 K= 4.3 Hct= 30   Doing well  Remo Lipps C. Roxan Hockey, MD [facility] [phone]

## 2015-03-13 ENCOUNTER — Inpatient Hospital Stay (HOSPITAL_COMMUNITY): Payer: Medicare HMO

## 2015-03-13 LAB — GLUCOSE, CAPILLARY
Glucose-Capillary: 103 mg/dL — ABNORMAL HIGH (ref 65–99)
Glucose-Capillary: 102 mg/dL — ABNORMAL HIGH (ref 65–99)
Glucose-Capillary: 135 mg/dL — ABNORMAL HIGH (ref 65–99)
Glucose-Capillary: 108 mg/dL — ABNORMAL HIGH (ref 65–99)
Glucose-Capillary: 122 mg/dL — ABNORMAL HIGH (ref 65–99)

## 2015-03-13 LAB — COMPREHENSIVE METABOLIC PANEL
ALT: 14 U/L — ABNORMAL LOW (ref 17–63)
AST: 14 U/L — ABNORMAL LOW (ref 15–41)
Albumin: 2.6 g/dL — ABNORMAL LOW (ref 3.5–5.0)
Alkaline Phosphatase: 51 U/L (ref 38–126)
Anion gap: 8 (ref 5–15)
BUN: 34 mg/dL — ABNORMAL HIGH (ref 6–20)
CO2: 22 mmol/L (ref 22–32)
Calcium: 8.5 mg/dL — ABNORMAL LOW (ref 8.9–10.3)
Chloride: 109 mmol/L (ref 101–111)
Creatinine, Ser: 2.71 mg/dL — ABNORMAL HIGH (ref 0.61–1.24)
GFR calc Af Amer: 25 mL/min — ABNORMAL LOW (ref >60)
GFR calc non Af Amer: 21 mL/min — ABNORMAL LOW (ref >60)
Glucose, Bld: 109 mg/dL — ABNORMAL HIGH (ref 65–99)
Potassium: 4 mmol/L (ref 3.5–5.1)
Sodium: 139 mmol/L (ref 135–145)
Total Bilirubin: 0.3 mg/dL (ref 0.3–1.2)
Total Protein: 4.7 g/dL — ABNORMAL LOW (ref 6.5–8.1)

## 2015-03-13 LAB — CBC
HCT: 29.1 % — ABNORMAL LOW (ref 39.0–52.0)
Hemoglobin: 9.3 g/dL — ABNORMAL LOW (ref 13.0–17.0)
MCH: 30.5 pg (ref 26.0–34.0)
MCHC: 32 g/dL (ref 30.0–36.0)
MCV: 95.4 fL (ref 78.0–100.0)
Platelets: 223 10*3/uL (ref 150–400)
RBC: 3.05 MIL/uL — ABNORMAL LOW (ref 4.22–5.81)
RDW: 13.6 % (ref 11.5–15.5)
WBC: 12.4 10*3/uL — ABNORMAL HIGH (ref 4.0–10.5)

## 2015-03-13 LAB — CARBOXYHEMOGLOBIN
Carboxyhemoglobin: 0.8 % (ref 0.5–1.5)
Methemoglobin: 1.2 % (ref 0.0–1.5)
O2 Saturation: 58.3 %
Total hemoglobin: 9.7 g/dL — ABNORMAL LOW (ref 13.5–18.0)

## 2015-03-13 MED ORDER — FUROSEMIDE 10 MG/ML IJ SOLN
40.0000 mg | Freq: Once | INTRAMUSCULAR | Status: AC
  Administered 2015-03-13: 40 mg via INTRAVENOUS
  Filled 2015-03-13: qty 4

## 2015-03-13 NOTE — Progress Notes (Signed)
Pt's foley was removed at 1745, several attempts to void have been made since then. Bladder scan at 2300 showed 300 ml. Will continue to monitor closely and re-scan in 2 hours.

## 2015-03-13 NOTE — Clinical Social Work Note (Addendum)
Clinical Social Work Assessment  Patient Details  Name: [name] [name] MRN: [id number] Date of Birth: 1940-10-04  Date of referral:  03/11/15               Reason for consult:  Facility Placement                Permission sought to share information with:  Facility Sport and exercise psychologist Permission granted to share information::  Yes, Verbal Permission Granted  Name::     [facility]; [facility]  Agency::     Relationship::     Contact Information:     Housing/Transportation Living arrangements for the past 2 months:  [place] of Information:  Patient Patient Interpreter Needed:  None Criminal Activity/Legal Involvement Pertinent to Current Situation/Hospitalization:  No - Comment as needed Significant Relationships:  Significant Other Lives with:  Self Do you feel safe going back to the place where you live?  No Need for family participation in patient care:  No (Coment)  Care giving concerns:  Patient reports that he currently lives alone and is open to short term rehab if recommended.    Social Worker assessment / plan:  Patient is a 75 YO Caucasian male who was admitted s/p CABG x4. Patient has a hx of CAD, HLD, CKD, HTN, DM, and gout. CSW engaged with Patient at his bedside. CSW introduced self, role of CSW, and began discussion of discharge planning. Patient lives at home alone and is wanting rehab post discharge to regain his strength and mobility. Patient reports that he lives in [place], [place] and would like to go to either [facility] or [facility]. CSW will continue to move forward with placement when medically cleared for discharge.   Employment status:  Retired Nurse, adult PT Recommendations:  [place] [facility] Information / Referral to community resources:  [place]  Patient/Family's Response to care:  Patient was very appreciative of care  provided to date. Patient denies any concerns.   Patient/Family's Understanding of and Emotional Response to Diagnosis, Current Treatment, and Prognosis:  Patient verbalizes strong understanding of diagnosis, current treatment, and prognosis. Patient is willing to go to SNF post discharge.   Emotional Assessment Appearance:  Appears younger than stated age, Well-Groomed Attitude/Demeanor/Rapport:   (Pleasant; Engaging; Cooperative) Affect (typically observed):  Accepting, Appropriate, Pleasant, Stable Orientation:  Oriented to Self, Oriented to Place, Oriented to  Time, Oriented to Situation Alcohol / Substance use:  Not Applicable Psych involvement (Current and /or in the community):  No (Comment)  Discharge Needs  Concerns to be addressed:  No discharge needs identified Readmission within the last 30 days:  No Current discharge risk:  None Barriers to Discharge:  No Barriers Identified   Judeth Horn, LCSW 03/13/2015, 11:21 AM

## 2015-03-13 NOTE — Progress Notes (Addendum)
TCTS DAILY ICU PROGRESS NOTE                   [address]            [place] [address]          [phone]   3 Days Post-Op Procedure(s) (LRB): CORONARY ARTERY BYPASS GRAFTING (CABG) x four  , using left internal mammary artery and bilateral greater saphenous veins harvested endoscopically (N/A) TRANSESOPHAGEAL ECHOCARDIOGRAM (TEE) (N/A)  Total Length of Stay:  LOS: 3 days   Subjective: Feels pretty well, no pain  Objective: Vital signs in last 24 hours: Temp:  [97.9 F (36.6 C)-98.4 F (36.9 C)] 97.9 F (36.6 C) (03/10 1210) Pulse Rate:  [57-141] 73 (03/10 1245) Cardiac Rhythm:  [-] Normal sinus rhythm (03/10 1200) Resp:  [12-31] 20 (03/10 1245) BP: (62-126)/(36-74) 102/59 mmHg (03/10 1245) SpO2:  [92 %-100 %] 100 % (03/10 1245) Weight:  [218 lb 11.1 oz (99.2 kg)] 218 lb 11.1 oz (99.2 kg) (03/10 0500)  Filed Weights   03/11/15 0500 03/12/15 0500 03/13/15 0500  Weight: 217 lb 13 oz (98.8 kg) 218 lb 14.7 oz (99.3 kg) 218 lb 11.1 oz (99.2 kg)    Weight change: -3.5 oz (-0.1 kg)   Hemodynamic parameters for last 24 hours:    Intake/Output from previous day: 03/09 0701 - 03/10 0700 In: 3332.6 [P.O.:600; I.V.:2732.6] Out: 1865 [Urine:1865]  Intake/Output this shift: Total I/O In: 440 [P.O.:240; I.V.:200] Out: 500 [Urine:500]  Current Meds: Scheduled Meds: . acetaminophen  1,000 mg Oral 4 times per day   Or  . acetaminophen (TYLENOL) oral liquid 160 mg/5 mL  1,000 mg Per Tube 4 times per day  . amiodarone  200 mg Oral BID  . antiseptic oral rinse  7 mL Mouth Rinse BID  . aspirin EC  325 mg Oral Daily   Or  . aspirin  324 mg Per Tube Daily  . bisacodyl  10 mg Oral Daily   Or  . bisacodyl  10 mg Rectal Daily  . cholecalciferol  2,000 Units Oral Daily  . clonazePAM  1 mg Oral Daily  . docusate sodium  200 mg Oral Daily  . insulin aspart  0-24 Units Subcutaneous 6 times per day  . insulin detemir  12 Units Subcutaneous BID  . metoprolol  tartrate  12.5 mg Oral BID   Or  . metoprolol tartrate  12.5 mg Per Tube BID  . pantoprazole  40 mg Oral Daily  . pneumococcal 23 valent vaccine  0.5 mL Intramuscular Once  . pravastatin  20 mg Oral q1800  . sodium chloride flush  3 mL Intravenous Q12H   Continuous Infusions: . sodium chloride Stopped (03/11/15 1000)  . sodium chloride    . sodium chloride Stopped (03/11/15 1000)  . DOPamine 2.5 mcg/kg/min (03/13/15 1200)  . lactated ringers Stopped (03/11/15 1000)  . lactated ringers 10 mL/hr at 03/13/15 1200  . phenylephrine (NEO-SYNEPHRINE) Adult infusion 30 mcg/min (03/13/15 1230)   PRN Meds:.sodium chloride, clotrimazole-betamethasone, colchicine, metoprolol, midazolam, morphine injection, ondansetron (ZOFRAN) IV, oxyCODONE, sodium chloride flush, traMADol  General appearance: alert, cooperative and no distress Heart: regular rate and rhythm Lungs: min dim in bases Abdomen: benign Extremities: + R.>L edema Wound: incis healing well  Lab Results: CBC: Recent Labs  03/12/15 0417 03/12/15 1552 03/13/15 0500  WBC 16.1*  --  12.4*  HGB 9.8* 10.2* 9.3*  HCT 30.1* 30.0* 29.1*  PLT 212  --  223  BMET:  Recent Labs  03/12/15 0417 03/12/15 1552 03/13/15 0500  NA 140 136 139  K 4.4 4.3 4.0  CL 106 101 109  CO2 22  --  22  GLUCOSE 158* 275* 109*  BUN 27* 31* 34*  CREATININE 2.48* 2.50* 2.71*  CALCIUM 8.6*  --  8.5*    PT/INR:  Recent Labs  03/10/15 1445  LABPROT 18.0*  INR 1.48   Radiology: Dg Chest Port 1 View  03/13/2015  CLINICAL DATA:  Status post CABG 3 days ago EXAM: PORTABLE CHEST 1 VIEW COMPARISON:  Portable chest x-ray of March 12, 2015 FINDINGS: The lungs remain mildly hypoinflated. Subsegmental atelectasis at the left lung base has improved. Minimal atelectatic changes at the right lung base is less conspicuous as well. The cardiac silhouette remains in it is enlarged but is accentuated by the apical lordotic positioning. The pulmonary vascularity is  not clearly engorged. The right internal jugular Cordis sheath projects over the proximal SVC. The sternal wires are intact. IMPRESSION: Mild improvement in pulmonary interstitial edema and bibasilar atelectasis. Today's study is obtained in a poor chronic lordotic projection which accentuates the cardiac silhouette. There is no pneumothorax. Electronically Signed   By: David  Martinique M.D.   On: 03/13/2015 07:37     Assessment/Plan: S/P Procedure(s) (LRB): CORONARY ARTERY BYPASS GRAFTING (CABG) x four  , using left internal mammary artery and bilateral greater saphenous veins harvested endoscopically (N/A) TRANSESOPHAGEAL ECHOCARDIOGRAM (TEE) (N/A)  1 progressing well 2 wean neo , co-ox 58, systolic BP a bit low, mean is acceptable 3 + volume overload, will give 40 lasix, creat up to 2.7, cont renal dose dopamine for now 4 routine pulm toilet 5 d/c foley 6 rehab as able 7 sugars ok 8 H/H stable   GOLD,WAYNE E 03/13/2015 1:42 PM  Chronic Kidney Disease   Stage I     GFR >90  Stage II    GFR 60-89  Stage IIIA GFR 45-59  Stage IIIB GFR 30-44  Stage IV   GFR 15-29  Stage V    GFR  <15  Lab Results  Component Value Date   CREATININE 2.71* 03/13/2015   Estimated Creatinine Clearance: 27.8 mL/min (by C-G formula based on Cr of 2.71).  Acute on chronic renal  failure  Continue dopamine  monitr renal function I have seen and examined Percell Belt and agree with the above assessment  and plan.  [name] MD Beeper [phone] Office [phone] 03/13/2015 3:24 PM

## 2015-03-13 NOTE — Progress Notes (Signed)
Patient ID: [name] [name], male   DOB: Mar 27, 1940, 75 y.o.   MRN: [id number] EVENING ROUNDS NOTE :     [address]       [place] [address]             [phone]                 3 Days Post-Op Procedure(s) (LRB): CORONARY ARTERY BYPASS GRAFTING (CABG) x four  , using left internal mammary artery and bilateral greater saphenous veins harvested endoscopically (N/A) TRANSESOPHAGEAL ECHOCARDIOGRAM (TEE) (N/A)  Total Length of Stay:  LOS: 3 days  BP 91/62 mmHg  Pulse 80  Temp(Src) 98.1 F (36.7 C) (Oral)  Resp 24  Ht 5\' 11"  (1.803 m)  Wt 218 lb 11.1 oz (99.2 kg)  BMI 30.52 kg/m2  SpO2 96%  .Intake/Output      03/10 0701 - 03/11 0700   P.O. 480   I.V. (mL/kg) 545.5 (5.5)   Total Intake(mL/kg) 1025.5 (10.3)   Urine (mL/kg/hr) 1500 (1.1)   Chest Tube    Total Output 1500   Net -474.5       Urine Occurrence 0 x     . sodium chloride Stopped (03/11/15 1000)  . sodium chloride    . sodium chloride Stopped (03/11/15 1000)  . DOPamine 2.5 mcg/kg/min (03/13/15 2000)  . lactated ringers Stopped (03/11/15 1000)  . lactated ringers 10 mL/hr at 03/13/15 2000  . phenylephrine (NEO-SYNEPHRINE) Adult infusion 55 mcg/min (03/13/15 2000)     Lab Results  Component Value Date   WBC 12.4* 03/13/2015   HGB 9.3* 03/13/2015   HCT 29.1* 03/13/2015   PLT 223 03/13/2015   GLUCOSE 109* 03/13/2015   ALT 14* 03/13/2015   AST 14* 03/13/2015   NA 139 03/13/2015   K 4.0 03/13/2015   CL 109 03/13/2015   CREATININE 2.71* 03/13/2015   BUN 34* 03/13/2015   CO2 22 03/13/2015   TSH 1.532 03/03/2015   INR 1.48 03/10/2015   HGBA1C 7.3* 03/09/2015   Still on neo to support bp Foley out has not urinated yet  Watch ranl function Check bladder scan  [name] MD  Beeper [phone] Office [phone] 03/13/2015 8:35 PM

## 2015-03-13 NOTE — Care Management Important Message (Signed)
Important Message  Patient Details  Name: [name] [name] MRN: [id number] Date of Birth: 03-25-1940   Medicare Important Message Given:  Yes    Nathen May 03/13/2015, 11:52 AM

## 2015-03-13 NOTE — Evaluation (Addendum)
Physical Therapy Evaluation Patient Details Name: [name] [name] MRN: [id number] DOB: [birth year] Today's Date: 03/13/2015   History of Present Illness  Patient is a 75 y/o male with hx of CAD, HLD, CKD, HTN, DM and gout presents s/p CABG x4.  Clinical Impression  Patient presents with decreased balance, endurance, strength and overall mobility s/p above surgery. Mobility challenging due to sternal precautions. Education on sternal precautions. Pt is independent PTA and lives alone but reports having some friends that could help at d/c. Tolerated ambulating 300' with Min guard assist with HR up to 128 bpm and drop in Sp02 to mid 80s but resolved quickly with rest and pursed lip breathing. Pt not safe to return home alone as pt does not have 24/7 assist. Will follow acutely to maximize independence and mobility.     Follow Up Recommendations SNF; 24/7 Supervision/Assistance    Equipment Recommendations  Rolling walker with 5" wheels    Recommendations for Other Services OT consult     Precautions / Restrictions Precautions Precautions: Sternal;Fall Restrictions Weight Bearing Restrictions: No      Mobility  Bed Mobility Overal bed mobility: Needs Assistance Bed Mobility: Sit to Sidelying;Rolling Rolling: Min assist       Sit to sidelying: Min assist;+2 for physical assistance;+2 for safety/equipment General bed mobility comments: CUes for log roll technique. Assist with bringing LEs into bed.   Transfers Overall transfer level: Needs assistance Equipment used: Pushed w/c (w/c.) Transfers: Sit to/from Stand Sit to Stand: Min assist         General transfer comment: Cues for use of momentum and anterior weight shift to stand. Min A to boost from chair holding heart pillow.  Ambulation/Gait Ambulation/Gait assistance: Min guard Ambulation Distance (Feet): 300 Feet Assistive device:  (pushing w/c.) Gait Pattern/deviations: Step-through pattern;Decreased stride  length Gait velocity: decreased   General Gait Details: Slow, steady gait pushing RW. HR up to 128 bpm. Sp02 dropping to mid 80s on RA. Cues for pursed lip breathing,.   Stairs            Wheelchair Mobility    Modified Rankin (Stroke Patients Only)       Balance Overall balance assessment: Needs assistance Sitting-balance support: Feet supported;No upper extremity supported Sitting balance-Leahy Scale: Good     Standing balance support: During functional activity Standing balance-Leahy Scale: Fair Standing balance comment: Able to stand unsupported statically.                             Pertinent Vitals/Pain Pain Assessment: No/denies pain    Home Living Family/patient expects to be discharged to:: Private residence Living Arrangements: Alone Available Help at Discharge: Friend(s);Available PRN/intermittently Type of Home: House Home Access: Level entry     Home Layout: One level;Laundry or work area in basement (1 flight to get to [employer] Home Equipment: None      Prior Function Level of Independence: Independent         Comments: Drives. Does IADLs. Reports having friends that might be able to help with laundry, meals, driving.     Hand Dominance        Extremity/Trunk Assessment   Upper Extremity Assessment: Defer to OT evaluation           Lower Extremity Assessment: Overall WFL for tasks assessed         Communication   Communication: No difficulties  Cognition Arousal/Alertness: Awake/alert Behavior During Therapy: WFL for tasks  assessed/performed Overall Cognitive Status: Within Functional Limits for tasks assessed                      General Comments      Exercises        Assessment/Plan    PT Assessment Patient needs continued PT services  PT Diagnosis Difficulty walking   PT Problem List Cardiopulmonary status limiting activity;Decreased activity tolerance;Decreased balance;Decreased  mobility;Decreased knowledge of precautions;Decreased knowledge of use of DME  PT Treatment Interventions Balance training;Functional mobility training;Therapeutic activities;Therapeutic exercise;Patient/family education;Gait training;Stair training   PT Goals (Current goals can be found in the Care Plan section) Acute Rehab PT Goals Patient Stated Goal: to return to independence PT Goal Formulation: With patient Time For Goal Achievement: 03/27/15 Potential to Achieve Goals: Good    Frequency Min 3X/week   Barriers to discharge Decreased caregiver support;Inaccessible home environment lives alone and has flight of steps to get to basement    Co-evaluation               End of Session   Activity Tolerance: Patient tolerated treatment well (elevated HR, drop in Sp02) Patient left: in bed;with call bell/phone within reach;with bed alarm set Nurse Communication: Mobility status         Time: [id number] PT Time Calculation (min) (ACUTE ONLY): 23 min   Charges:   PT Evaluation $PT Eval Moderate Complexity: 1 Procedure PT Treatments $Gait Training: 8-22 mins   PT G Codes:        Faylene Allerton A Rickell Wiehe 03/13/2015, 10:48 AM Wray Kearns, PT, DPT [phone]

## 2015-03-14 ENCOUNTER — Inpatient Hospital Stay (HOSPITAL_COMMUNITY): Payer: Medicare HMO

## 2015-03-14 LAB — BASIC METABOLIC PANEL
ANION GAP: 9 (ref 5–15)
BUN: 40 mg/dL — AB (ref 6–20)
CHLORIDE: 103 mmol/L (ref 101–111)
CO2: 22 mmol/L (ref 22–32)
Calcium: 8.4 mg/dL — ABNORMAL LOW (ref 8.9–10.3)
Creatinine, Ser: 2.61 mg/dL — ABNORMAL HIGH (ref 0.61–1.24)
GFR calc Af Amer: 26 mL/min — ABNORMAL LOW (ref >60)
GFR calc non Af Amer: 22 mL/min — ABNORMAL LOW (ref >60)
GLUCOSE: 128 mg/dL — AB (ref 65–99)
POTASSIUM: 3.9 mmol/L (ref 3.5–5.1)
Sodium: 134 mmol/L — ABNORMAL LOW (ref 135–145)

## 2015-03-14 LAB — CBC
HCT: 27.3 % — ABNORMAL LOW (ref 39.0–52.0)
Hemoglobin: 8.8 g/dL — ABNORMAL LOW (ref 13.0–17.0)
MCH: 30.4 pg (ref 26.0–34.0)
MCHC: 32.2 g/dL (ref 30.0–36.0)
MCV: 94.5 fL (ref 78.0–100.0)
Platelets: 243 10*3/uL (ref 150–400)
RBC: 2.89 MIL/uL — ABNORMAL LOW (ref 4.22–5.81)
RDW: 13.4 % (ref 11.5–15.5)
WBC: 13 10*3/uL — ABNORMAL HIGH (ref 4.0–10.5)

## 2015-03-14 LAB — GLUCOSE, CAPILLARY
Glucose-Capillary: 100 mg/dL — ABNORMAL HIGH (ref 65–99)
Glucose-Capillary: 109 mg/dL — ABNORMAL HIGH (ref 65–99)
Glucose-Capillary: 114 mg/dL — ABNORMAL HIGH (ref 65–99)
Glucose-Capillary: 172 mg/dL — ABNORMAL HIGH (ref 65–99)
Glucose-Capillary: 106 mg/dL — ABNORMAL HIGH (ref 65–99)
Glucose-Capillary: 98 mg/dL (ref 65–99)

## 2015-03-14 LAB — CARBOXYHEMOGLOBIN
Carboxyhemoglobin: 1 % (ref 0.5–1.5)
Methemoglobin: 1.2 % (ref 0.0–1.5)
O2 Saturation: 59 %
Total hemoglobin: 8.9 g/dL — ABNORMAL LOW (ref 13.5–18.0)

## 2015-03-14 MED ORDER — FUROSEMIDE 10 MG/ML IJ SOLN
40.0000 mg | Freq: Two times a day (BID) | INTRAMUSCULAR | Status: AC
  Administered 2015-03-14 – 2015-03-15 (×2): 40 mg via INTRAVENOUS
  Filled 2015-03-14 (×2): qty 4

## 2015-03-14 MED ORDER — POTASSIUM CHLORIDE CRYS ER 20 MEQ PO TBCR
20.0000 meq | EXTENDED_RELEASE_TABLET | Freq: Once | ORAL | Status: AC
  Administered 2015-03-14: 20 meq via ORAL
  Filled 2015-03-14: qty 1

## 2015-03-14 NOTE — Progress Notes (Signed)
Patient ID: [name] [name], male   DOB: 06-07-1940, 75 y.o.   MRN: [id number] EVENING ROUNDS NOTE :     [address]       [place] [address]             [phone]                 4 Days Post-Op Procedure(s) (LRB): CORONARY ARTERY BYPASS GRAFTING (CABG) x four  , using left internal mammary artery and bilateral greater saphenous veins harvested endoscopically (N/A) TRANSESOPHAGEAL ECHOCARDIOGRAM (TEE) (N/A)  Total Length of Stay:  LOS: 4 days  BP 108/58 mmHg  Pulse 65  Temp(Src) 97.2 F (36.2 C) (Oral)  Resp 17  Ht 5\' 11"  (1.803 m)  Wt 218 lb 4.1 oz (99 kg)  BMI 30.45 kg/m2  SpO2 97%  .Intake/Output      03/10 0701 - 03/11 0700 03/11 0701 - 03/12 0700   P.O. 2080 320   I.V. (mL/kg) 1209.7 (12.2) 479.3 (4.8)   Total Intake(mL/kg) 3289.7 (33.2) 799.3 (8.1)   Urine (mL/kg/hr) 1900 (0.8) 575 (0.5)   Chest Tube     Total Output 1900 575   Net +1389.7 +224.3        Urine Occurrence 0 x 1 x     . sodium chloride Stopped (03/11/15 1000)  . sodium chloride    . sodium chloride Stopped (03/11/15 1000)  . DOPamine 2.5 mcg/kg/min (03/14/15 1600)  . lactated ringers 20 mL/hr at 03/13/15 2000  . lactated ringers 20 mL/hr at 03/14/15 1600  . phenylephrine (NEO-SYNEPHRINE) Adult infusion 30 mcg/min (03/14/15 1620)     Lab Results  Component Value Date   WBC 13.0* 03/14/2015   HGB 8.8* 03/14/2015   HCT 27.3* 03/14/2015   PLT 243 03/14/2015   GLUCOSE 128* 03/14/2015   ALT 14* 03/13/2015   AST 14* 03/13/2015   NA 134* 03/14/2015   K 3.9 03/14/2015   CL 103 03/14/2015   CREATININE 2.61* 03/14/2015   BUN 40* 03/14/2015   CO2 22 03/14/2015   TSH 1.532 03/03/2015   INR 1.48 03/10/2015   HGBA1C 7.3* 03/09/2015   Stable day   [name] MD  Beeper [phone] Office [phone] 03/14/2015 6:16 PM

## 2015-03-14 NOTE — Progress Notes (Addendum)
TCTS DAILY ICU PROGRESS NOTE                   [address]            [place] [address]          [phone]   4 Days Post-Op Procedure(s) (LRB): CORONARY ARTERY BYPASS GRAFTING (CABG) x four  , using left internal mammary artery and bilateral greater saphenous veins harvested endoscopically (N/A) TRANSESOPHAGEAL ECHOCARDIOGRAM (TEE) (N/A)  Total Length of Stay:  LOS: 4 days   Subjective: Looks and feels pretty well, still on low dose neo/dop for BP  Objective: Vital signs in last 24 hours: Temp:  [97.4 F (36.3 C)-98.8 F (37.1 C)] 97.4 F (36.3 C) (03/11 1254) Pulse Rate:  [64-155] 71 (03/11 1200) Cardiac Rhythm:  [-] Normal sinus rhythm (03/11 1200) Resp:  [17-34] 23 (03/11 1200) BP: (73-150)/(38-91) 90/64 mmHg (03/11 1200) SpO2:  [91 %-100 %] 98 % (03/11 1200) Weight:  [218 lb 4.1 oz (99 kg)] 218 lb 4.1 oz (99 kg) (03/11 0500)  Filed Weights   03/12/15 0500 03/13/15 0500 03/14/15 0500  Weight: 218 lb 14.7 oz (99.3 kg) 218 lb 11.1 oz (99.2 kg) 218 lb 4.1 oz (99 kg)    Weight change: -7.1 oz (-0.2 kg)   Hemodynamic parameters for last 24 hours:    Intake/Output from previous day: 03/10 0701 - 03/11 0700 In: 3289.7 [P.O.:2080; I.V.:1209.7] Out: 1900 [Urine:1900]  Intake/Output this shift: Total I/O In: 573.8 [P.O.:320; I.V.:253.8] Out: 575 [Urine:575]  Current Meds: Scheduled Meds: . acetaminophen  1,000 mg Oral 4 times per day   Or  . acetaminophen (TYLENOL) oral liquid 160 mg/5 mL  1,000 mg Per Tube 4 times per day  . amiodarone  200 mg Oral BID  . antiseptic oral rinse  7 mL Mouth Rinse BID  . aspirin EC  325 mg Oral Daily   Or  . aspirin  324 mg Per Tube Daily  . bisacodyl  10 mg Oral Daily   Or  . bisacodyl  10 mg Rectal Daily  . cholecalciferol  2,000 Units Oral Daily  . clonazePAM  1 mg Oral Daily  . docusate sodium  200 mg Oral Daily  . insulin aspart  0-24 Units Subcutaneous 6 times per day  . insulin detemir  12 Units  Subcutaneous BID  . metoprolol tartrate  12.5 mg Oral BID   Or  . metoprolol tartrate  12.5 mg Per Tube BID  . pantoprazole  40 mg Oral Daily  . pneumococcal 23 valent vaccine  0.5 mL Intramuscular Once  . pravastatin  20 mg Oral q1800  . sodium chloride flush  3 mL Intravenous Q12H   Continuous Infusions: . sodium chloride Stopped (03/11/15 1000)  . sodium chloride    . sodium chloride Stopped (03/11/15 1000)  . DOPamine 2.5 mcg/kg/min (03/14/15 1200)  . lactated ringers 20 mL/hr at 03/13/15 2000  . lactated ringers 20 mL/hr at 03/14/15 1200  . phenylephrine (NEO-SYNEPHRINE) Adult infusion 40 mcg/min (03/14/15 1200)   PRN Meds:.sodium chloride, clotrimazole-betamethasone, colchicine, metoprolol, midazolam, morphine injection, ondansetron (ZOFRAN) IV, oxyCODONE, sodium chloride flush, traMADol  General appearance: alert, cooperative and no distress Heart: regular rate and rhythm Lungs: dim in bases Abdomen: benign Extremities: + edema Wound: incis healing well  Lab Results: CBC: Recent Labs  03/13/15 0500 03/14/15 0415  WBC 12.4* 13.0*  HGB 9.3* 8.8*  HCT 29.1* 27.3*  PLT 223 243   BMET:  Recent  Labs  03/13/15 0500 03/14/15 0415  NA 139 134*  K 4.0 3.9  CL 109 103  CO2 22 22  GLUCOSE 109* 128*  BUN 34* 40*  CREATININE 2.71* 2.61*  CALCIUM 8.5* 8.4*    PT/INR: No results for input(s): LABPROT, INR in the last 72 hours. Radiology: Dg Chest Port 1 View  03/14/2015  CLINICAL DATA:  Chest tube in place, shortness of breath EXAM: PORTABLE CHEST 1 VIEW COMPARISON:  03/13/2015 FINDINGS: No chest tube is visualized. Mild left basilar atelectasis.  No pleural effusion or pneumothorax. Cardiomegaly.  Postsurgical changes related to prior CABG. Right IJ venous sheath. Median sternotomy. IMPRESSION: No chest tube is visualized.  No pneumothorax. Mild basilar atelectasis. Electronically Signed   By: Julian Hy M.D.   On: 03/14/2015 09:41     Assessment/Plan: S/P  Procedure(s) (LRB): CORONARY ARTERY BYPASS GRAFTING (CABG) x four  , using left internal mammary artery and bilateral greater saphenous veins harvested endoscopically (N/A) TRANSESOPHAGEAL ECHOCARDIOGRAM (TEE) (N/A)  1 cont to wean off neo/dop. COOX 59, volume overloaded but good UO after lasix 2 creat decreased a bit , will give lasix bid today 3 H/H fairly stable 4 push rehab as able  GOLD,WAYNE E 03/14/2015 1:28 PM  Slowly improving, renal function stabilized I have seen and examined Percell Belt and agree with the above assessment  and plan.  [name] MD Beeper [phone] Office [phone] 03/14/2015 3:17 PM

## 2015-03-15 ENCOUNTER — Encounter (HOSPITAL_COMMUNITY): Payer: Self-pay | Admitting: *Deleted

## 2015-03-15 LAB — CBC
HCT: 25.8 % — ABNORMAL LOW (ref 39.0–52.0)
Hemoglobin: 8.3 g/dL — ABNORMAL LOW (ref 13.0–17.0)
MCH: 30 pg (ref 26.0–34.0)
MCHC: 32.2 g/dL (ref 30.0–36.0)
MCV: 93.1 fL (ref 78.0–100.0)
Platelets: 240 10*3/uL (ref 150–400)
RBC: 2.77 MIL/uL — ABNORMAL LOW (ref 4.22–5.81)
RDW: 13.3 % (ref 11.5–15.5)
WBC: 10.6 10*3/uL — ABNORMAL HIGH (ref 4.0–10.5)

## 2015-03-15 LAB — BASIC METABOLIC PANEL
ANION GAP: 8 (ref 5–15)
BUN: 44 mg/dL — ABNORMAL HIGH (ref 6–20)
CALCIUM: 8.5 mg/dL — AB (ref 8.9–10.3)
CO2: 22 mmol/L (ref 22–32)
Chloride: 103 mmol/L (ref 101–111)
Creatinine, Ser: 2.68 mg/dL — ABNORMAL HIGH (ref 0.61–1.24)
GFR, EST AFRICAN AMERICAN: 25 mL/min — AB (ref >60)
GFR, EST NON AFRICAN AMERICAN: 22 mL/min — AB (ref >60)
Glucose, Bld: 130 mg/dL — ABNORMAL HIGH (ref 65–99)
POTASSIUM: 4.3 mmol/L (ref 3.5–5.1)
Sodium: 133 mmol/L — ABNORMAL LOW (ref 135–145)

## 2015-03-15 LAB — GLUCOSE, CAPILLARY
Glucose-Capillary: 111 mg/dL — ABNORMAL HIGH (ref 65–99)
Glucose-Capillary: 112 mg/dL — ABNORMAL HIGH (ref 65–99)
Glucose-Capillary: 116 mg/dL — ABNORMAL HIGH (ref 65–99)
Glucose-Capillary: 120 mg/dL — ABNORMAL HIGH (ref 65–99)
Glucose-Capillary: 143 mg/dL — ABNORMAL HIGH (ref 65–99)
Glucose-Capillary: 82 mg/dL (ref 65–99)

## 2015-03-15 LAB — BRAIN NATRIURETIC PEPTIDE: B NATRIURETIC PEPTIDE 5: 209 pg/mL — AB (ref 0.0–100.0)

## 2015-03-15 NOTE — Progress Notes (Signed)
Patient denied any pain except soreness. Slept well and didn't have any issues overnight.

## 2015-03-15 NOTE — Progress Notes (Signed)
Patient ID: CHANCELOR [name], male   DOB: 06-10-1940, 75 y.o.   MRN: [id number] TCTS DAILY ICU PROGRESS NOTE                   [address]            [place] [address]          [phone]   5 Days Post-Op Procedure(s) (LRB): CORONARY ARTERY BYPASS GRAFTING (CABG) x four  , using left internal mammary artery and bilateral greater saphenous veins harvested endoscopically (N/A) TRANSESOPHAGEAL ECHOCARDIOGRAM (TEE) (N/A)  Total Length of Stay:  LOS: 5 days   Subjective: Feels well , alert and neuro intact   Objective: Vital signs in last 24 hours: Temp:  [97.2 F (36.2 C)-98.3 F (36.8 C)] 97.4 F (36.3 C) (03/12 0843) Pulse Rate:  [61-93] 74 (03/12 0843) Cardiac Rhythm:  [-] Normal sinus rhythm (03/12 0400) Resp:  [13-25] 19 (03/12 0843) BP: (72-146)/(50-114) 97/56 mmHg (03/12 0843) SpO2:  [90 %-100 %] 96 % (03/12 0843) Weight:  [222 lb 10.6 oz (101 kg)] 222 lb 10.6 oz (101 kg) (03/12 0600)  Filed Weights   03/13/15 0500 03/14/15 0500 03/15/15 0600  Weight: 218 lb 11.1 oz (99.2 kg) 218 lb 4.1 oz (99 kg) 222 lb 10.6 oz (101 kg)    Weight change: 4 lb 6.5 oz (2 kg)   Hemodynamic parameters for last 24 hours:    Intake/Output from previous day: 03/11 0701 - 03/12 0700 In: 1450.8 [P.O.:320; I.V.:1130.8] Out: 2025 [Urine:2025]  Intake/Output this shift:    Current Meds: Scheduled Meds: . acetaminophen  1,000 mg Oral 4 times per day   Or  . acetaminophen (TYLENOL) oral liquid 160 mg/5 mL  1,000 mg Per Tube 4 times per day  . amiodarone  200 mg Oral BID  . antiseptic oral rinse  7 mL Mouth Rinse BID  . aspirin EC  325 mg Oral Daily   Or  . aspirin  324 mg Per Tube Daily  . bisacodyl  10 mg Oral Daily   Or  . bisacodyl  10 mg Rectal Daily  . cholecalciferol  2,000 Units Oral Daily  . clonazePAM  1 mg Oral Daily  . docusate sodium  200 mg Oral Daily  . insulin aspart  0-24 Units Subcutaneous 6 times per day  . insulin detemir  12 Units  Subcutaneous BID  . metoprolol tartrate  12.5 mg Oral BID   Or  . metoprolol tartrate  12.5 mg Per Tube BID  . pantoprazole  40 mg Oral Daily  . pneumococcal 23 valent vaccine  0.5 mL Intramuscular Once  . pravastatin  20 mg Oral q1800  . sodium chloride flush  3 mL Intravenous Q12H   Continuous Infusions: . sodium chloride Stopped (03/11/15 1000)  . sodium chloride    . sodium chloride Stopped (03/11/15 1000)  . DOPamine 2.5 mcg/kg/min (03/15/15 0437)  . lactated ringers 20 mL/hr at 03/13/15 2000  . lactated ringers 20 mL/hr at 03/14/15 1800  . phenylephrine (NEO-SYNEPHRINE) Adult infusion 40 mcg/min (03/15/15 0621)   PRN Meds:.sodium chloride, clotrimazole-betamethasone, colchicine, metoprolol, midazolam, morphine injection, ondansetron (ZOFRAN) IV, oxyCODONE, sodium chloride flush, traMADol  General appearance: alert and cooperative Neurologic: intact Heart: regular rate and rhythm, S1, S2 normal, no murmur, click, rub or gallop Lungs: clear to auscultation bilaterally Abdomen: soft, non-tender; bowel sounds normal; no masses,  no organomegaly Extremities: extremities normal, atraumatic, no cyanosis or edema and Homans sign is negative,  no sign of DVT Wound: sternum stable  Lab Results: CBC: Recent Labs  03/14/15 0415 03/15/15 0405  WBC 13.0* 10.6*  HGB 8.8* 8.3*  HCT 27.3* 25.8*  PLT 243 240   BMET:  Recent Labs  03/14/15 0415 03/15/15 0405  NA 134* 133*  K 3.9 4.3  CL 103 103  CO2 22 22  GLUCOSE 128* 130*  BUN 40* 44*  CREATININE 2.61* 2.68*  CALCIUM 8.4* 8.5*    PT/INR: No results for input(s): LABPROT, INR in the last 72 hours. Radiology: No results found.   Assessment/Plan: S/P Procedure(s) (LRB): CORONARY ARTERY BYPASS GRAFTING (CABG) x four  , using left internal mammary artery and bilateral greater saphenous veins harvested endoscopically (N/A) TRANSESOPHAGEAL ECHOCARDIOGRAM (TEE) (N/A) Mobilize Acute on chronic kd  Still on dopamine and neo  for bp, better today   [name] [name] 03/15/2015 9:17 AM

## 2015-03-15 NOTE — NC FL2 (Signed)
Jud LEVEL OF CARE SCREENING TOOL     IDENTIFICATION  Patient Name: [name] [name] Birthdate: 27-Mar-1940 Sex: male Admission Date (Current Location): 03/10/2015  [facility] and [place] Number:  Herbalist and Address:  [facility] [place], [place] [address] [place], [place] [address]      Provider Number: [id number]  Attending Physician Name and Address:  Ivin Poot, MD  Relative Name and Phone Number:       Current Level of Care: Hospital Recommended Level of Care: [place] Prior Approval Number:    Date Approved/Denied:   PASRR Number:    Discharge Plan: SNF    Current Diagnoses: Patient Active Problem List   Diagnosis Date Noted  . 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 Carotid stenosis- high grade Rt 03/03/2015  . CAD (coronary artery disease) 03/02/2015  . Abnormal nuclear stress test   . Coronary artery disease involving native coronary artery of native heart without angina pectoris- plans for CABG   . Essential hypertension, benign 05/21/2009  . RENAL FAILURE, CHRONIC 11/20/2008  . Pure hypercholesterolemia 11/19/2008  . CORONARY ATHEROSCLEROSIS NATIVE CORONARY ARTERY 11/19/2008  . SHORTNESS OF BREATH 11/19/2008    Orientation RESPIRATION BLADDER Height & Weight     Self, Time, Situation, Place  Normal Indwelling catheter Weight: 222 lb 10.6 oz (101 kg) Height:  5\' 11"  (180.3 cm)  BEHAVIORAL SYMPTOMS/MOOD NEUROLOGICAL BOWEL NUTRITION STATUS      Continent Diet  AMBULATORY STATUS COMMUNICATION OF NEEDS Skin   Independent Verbally Surgical wounds                       Personal Care Assistance Level of Assistance  Bathing, Feeding, Dressing Bathing Assistance: Limited assistance Feeding assistance: Independent Dressing Assistance: Limited assistance     Functional Limitations Info  Sight, Hearing, Speech Sight Info: Adequate Hearing Info: Adequate Speech Info: Adequate    SPECIAL CARE FACTORS  FREQUENCY  PT (By licensed PT)                    Contractures Contractures Info: Not present    Additional Factors Info  Allergies, Insulin Sliding Scale   Allergies Info: NKA   Insulin Sliding Scale Info: 0-24 units; 6x/day       Current Medications (03/15/2015):  This is the current hospital active medication list Current Facility-Administered Medications  Medication Dose Route Frequency Provider Last Rate Last Dose  . 0.45 % sodium chloride infusion   Intravenous Continuous PRN Erin R Barrett, PA-C   Stopped at 03/11/15 1000  . 0.9 %  sodium chloride infusion  250 mL Intravenous Continuous Erin R Barrett, PA-C   0 mL at 03/11/15 0600  . 0.9 %  sodium chloride infusion   Intravenous Continuous Erin R Barrett, PA-C   Stopped at 03/11/15 1000  . acetaminophen (TYLENOL) tablet 1,000 mg  1,000 mg Oral 4 times per day Erin R Barrett, PA-C   1,000 mg at 03/15/15 1300   Or  . acetaminophen (TYLENOL) solution 1,000 mg  1,000 mg Per Tube 4 times per day Erin R Barrett, PA-C      . amiodarone (PACERONE) tablet 200 mg  200 mg Oral BID Ivin Poot, MD   200 mg at 03/15/15 1006  . antiseptic oral rinse (CPC / CETYLPYRIDINIUM CHLORIDE 0.05%) solution 7 mL  7 mL Mouth Rinse BID Ivin Poot, MD   7 mL at 03/15/15 1022  .  aspirin EC tablet 325 mg  325 mg Oral Daily Erin R Barrett, PA-C   325 mg at 03/14/15 1022   Or  . aspirin chewable tablet 324 mg  324 mg Per Tube Daily Erin R Barrett, PA-C      . bisacodyl (DULCOLAX) EC tablet 10 mg  10 mg Oral Daily Erin R Barrett, PA-C   10 mg at 03/15/15 1005   Or  . bisacodyl (DULCOLAX) suppository 10 mg  10 mg Rectal Daily Erin R Barrett, PA-C      . cholecalciferol (VITAMIN D) tablet 2,000 Units  2,000 Units Oral Daily Erin R Barrett, PA-C   2,000 Units at 03/15/15 1015  . clonazePAM (KLONOPIN) tablet 1 mg  1 mg Oral Daily Erin R Barrett, PA-C   1 mg at 03/15/15 1006  . clotrimazole-betamethasone (LOTRISONE) cream 1 application  1  application Topical PRN Erin R Barrett, PA-C      . colchicine tablet 0.6 mg  0.6 mg Oral Daily PRN Erin R Barrett, PA-C      . docusate sodium (COLACE) capsule 200 mg  200 mg Oral Daily Erin R Barrett, PA-C   200 mg at 03/15/15 1006  . DOPamine (INTROPIN) 800 mg in dextrose 5 % 250 mL (3.2 mg/mL) infusion  2.5 mcg/kg/min Intravenous Titrated Ivin Poot, MD 4.5 mL/hr at 03/15/15 0437 2.5 mcg/kg/min at 03/15/15 0437  . insulin aspart (novoLOG) injection 0-24 Units  0-24 Units Subcutaneous 6 times per day Ivin Poot, MD   2 Units at 03/15/15 1300  . insulin detemir (LEVEMIR) injection 12 Units  12 Units Subcutaneous BID Ivin Poot, MD   12 Units at 03/15/15 1007  . lactated ringers infusion   Intravenous Continuous Erin R Barrett, PA-C 20 mL/hr at 03/13/15 2000    . lactated ringers infusion   Intravenous Continuous Erin R Barrett, PA-C 20 mL/hr at 03/14/15 1800    . metoprolol (LOPRESSOR) injection 2.5-5 mg  2.5-5 mg Intravenous Q2H PRN Erin R Barrett, PA-C      . metoprolol tartrate (LOPRESSOR) tablet 12.5 mg  12.5 mg Oral BID Erin R Barrett, PA-C   12.5 mg at 03/12/15 [id number]   Or  . metoprolol tartrate (LOPRESSOR) 25 mg/10 mL oral suspension 12.5 mg  12.5 mg Per Tube BID Erin R Barrett, PA-C   12.5 mg at 03/10/15 1830  . midazolam (VERSED) injection 2 mg  2 mg Intravenous Q1H PRN Erin R Barrett, PA-C      . morphine 2 MG/ML injection 2-5 mg  2-5 mg Intravenous Q1H PRN Erin R Barrett, PA-C   2 mg at 03/11/15 1000  . ondansetron (ZOFRAN) injection 4 mg  4 mg Intravenous Q6H PRN Erin R Barrett, PA-C   4 mg at 03/14/15 1600  . oxyCODONE (Oxy IR/ROXICODONE) immediate release tablet 5-10 mg  5-10 mg Oral Q3H PRN Erin R Barrett, PA-C      . pantoprazole (PROTONIX) EC tablet 40 mg  40 mg Oral Daily Erin R Barrett, PA-C   40 mg at 03/15/15 1007  . phenylephrine (NEO-SYNEPHRINE) 20 mg in dextrose 5 % 250 mL (0.08 mg/mL) infusion  0-100 mcg/min Intravenous Titrated Wayne E Gold, PA-C 22.5 mL/hr  at 03/15/15 1500 30 mcg/min at 03/15/15 1500  . pneumococcal 23 valent vaccine (PNU-IMMUNE) injection 0.5 mL  0.5 mL Intramuscular Once Tharon Aquas Trigt, MD      . pravastatin (PRAVACHOL) tablet 20 mg  20 mg Oral q1800 Erin R Barrett, PA-C  20 mg at 03/14/15 1741  . sodium chloride flush (NS) 0.9 % injection 3 mL  3 mL Intravenous Q12H Erin R Barrett, PA-C   3 mL at 03/14/15 2140  . sodium chloride flush (NS) 0.9 % injection 3 mL  3 mL Intravenous PRN Erin R Barrett, PA-C      . traMADol (ULTRAM) tablet 50-100 mg  50-100 mg Oral Q4H PRN Erin R Barrett, PA-C   100 mg at 03/14/15 1022     Discharge Medications: Please see discharge summary for a list of discharge medications.  Relevant Imaging Results:  Relevant Lab Results:   Additional Information    Channell Quattrone, Daneil Dolin, LCSW

## 2015-03-15 NOTE — Progress Notes (Signed)
Patient ID: [name] [name], male   DOB: 1940/06/20, 75 y.o.   MRN: [id number] EVENING ROUNDS NOTE :     [place]       [place] [address]             [phone]                 5 Days Post-Op Procedure(s) (LRB): CORONARY ARTERY BYPASS GRAFTING (CABG) x four  , using left internal mammary artery and bilateral greater saphenous veins harvested endoscopically (N/A) TRANSESOPHAGEAL ECHOCARDIOGRAM (TEE) (N/A)  Total Length of Stay:  LOS: 5 days  BP 83/49 mmHg  Pulse 72  Temp(Src) 98.1 F (36.7 C) (Oral)  Resp 22  Ht 5\' 11"  (1.803 m)  Wt 222 lb 10.6 oz (101 kg)  BMI 31.07 kg/m2  SpO2 97%  .Intake/Output      03/12 0701 - 03/13 0700   P.O. 980   I.V. (mL/kg) 560 (5.5)   Total Intake(mL/kg) 1540 (15.2)   Urine (mL/kg/hr) 400 (0.3)   Emesis/NG output    Total Output 400   Net +1140         . sodium chloride Stopped (03/11/15 1000)  . sodium chloride    . sodium chloride Stopped (03/11/15 1000)  . DOPamine 2.5 mcg/kg/min (03/15/15 0437)  . lactated ringers 20 mL/hr at 03/13/15 2000  . lactated ringers 20 mL/hr at 03/14/15 1800  . phenylephrine (NEO-SYNEPHRINE) Adult infusion 20 mcg/min (03/15/15 1900)     Lab Results  Component Value Date   WBC 10.6* 03/15/2015   HGB 8.3* 03/15/2015   HCT 25.8* 03/15/2015   PLT 240 03/15/2015   GLUCOSE 130* 03/15/2015   ALT 14* 03/13/2015   AST 14* 03/13/2015   NA 133* 03/15/2015   K 4.3 03/15/2015   CL 103 03/15/2015   CREATININE 2.68* 03/15/2015   BUN 44* 03/15/2015   CO2 22 03/15/2015   TSH 1.532 03/03/2015   INR 1.48 03/10/2015   HGBA1C 7.3* 03/09/2015   Feels well, still on neo for bp  [name] MD  Beeper [id number] Office [phone] 03/15/2015 7:41 PM

## 2015-03-16 ENCOUNTER — Inpatient Hospital Stay (HOSPITAL_COMMUNITY): Payer: Medicare HMO

## 2015-03-16 LAB — URINALYSIS, ROUTINE W REFLEX MICROSCOPIC
Bilirubin Urine: NEGATIVE
Glucose, UA: NEGATIVE mg/dL
Hgb urine dipstick: NEGATIVE
Ketones, ur: NEGATIVE mg/dL
Leukocytes, UA: NEGATIVE
Nitrite: NEGATIVE
Protein, ur: NEGATIVE mg/dL
Specific Gravity, Urine: 1.008 (ref 1.005–1.030)
pH: 6 (ref 5.0–8.0)

## 2015-03-16 LAB — PROTEIN / CREATININE RATIO, URINE
Creatinine, Urine: 42.53 mg/dL
Protein Creatinine Ratio: 0.42 mg/mg{Cre} — ABNORMAL HIGH (ref 0.00–0.15)
Total Protein, Urine: 18 mg/dL

## 2015-03-16 LAB — BASIC METABOLIC PANEL
Anion gap: 12 (ref 5–15)
BUN: 52 mg/dL — ABNORMAL HIGH (ref 6–20)
CO2: 22 mmol/L (ref 22–32)
Calcium: 9 mg/dL (ref 8.9–10.3)
Chloride: 103 mmol/L (ref 101–111)
Creatinine, Ser: 3.07 mg/dL — ABNORMAL HIGH (ref 0.61–1.24)
GFR calc Af Amer: 21 mL/min — ABNORMAL LOW (ref >60)
GFR calc non Af Amer: 18 mL/min — ABNORMAL LOW (ref >60)
Glucose, Bld: 108 mg/dL — ABNORMAL HIGH (ref 65–99)
Potassium: 4.3 mmol/L (ref 3.5–5.1)
Sodium: 137 mmol/L (ref 135–145)

## 2015-03-16 LAB — GLUCOSE, CAPILLARY
Glucose-Capillary: 111 mg/dL — ABNORMAL HIGH (ref 65–99)
Glucose-Capillary: 93 mg/dL (ref 65–99)
Glucose-Capillary: 91 mg/dL (ref 65–99)
Glucose-Capillary: 114 mg/dL — ABNORMAL HIGH (ref 65–99)
Glucose-Capillary: 173 mg/dL — ABNORMAL HIGH (ref 65–99)
Glucose-Capillary: 134 mg/dL — ABNORMAL HIGH (ref 65–99)
Glucose-Capillary: 144 mg/dL — ABNORMAL HIGH (ref 65–99)

## 2015-03-16 LAB — CBC
HCT: 26 % — ABNORMAL LOW (ref 39.0–52.0)
Hemoglobin: 8.5 g/dL — ABNORMAL LOW (ref 13.0–17.0)
MCH: 30.4 pg (ref 26.0–34.0)
MCHC: 32.7 g/dL (ref 30.0–36.0)
MCV: 92.9 fL (ref 78.0–100.0)
Platelets: 299 10*3/uL (ref 150–400)
RBC: 2.8 MIL/uL — ABNORMAL LOW (ref 4.22–5.81)
RDW: 13.2 % (ref 11.5–15.5)
WBC: 9.3 10*3/uL (ref 4.0–10.5)

## 2015-03-16 LAB — CARBOXYHEMOGLOBIN
Carboxyhemoglobin: 1.7 % — ABNORMAL HIGH (ref 0.5–1.5)
Methemoglobin: 1.1 % (ref 0.0–1.5)
O2 Saturation: 59.4 %
Total hemoglobin: 9.3 g/dL — ABNORMAL LOW (ref 13.5–18.0)

## 2015-03-16 LAB — SODIUM, URINE, RANDOM: Sodium, Ur: 30 mmol/L

## 2015-03-16 MED ORDER — ENOXAPARIN SODIUM 30 MG/0.3ML ~~LOC~~ SOLN
30.0000 mg | SUBCUTANEOUS | Status: DC
  Administered 2015-03-16 – 2015-03-18 (×3): 30 mg via SUBCUTANEOUS
  Filled 2015-03-16 (×3): qty 0.3

## 2015-03-16 MED ORDER — LIDOCAINE HCL 1 % IJ SOLN
INTRAMUSCULAR | Status: AC
  Filled 2015-03-16: qty 20

## 2015-03-16 MED ORDER — METOPROLOL TARTRATE 1 MG/ML IV SOLN
2.5000 mg | Freq: Once | INTRAVENOUS | Status: AC
  Administered 2015-03-16: 2.5 mg via INTRAVENOUS

## 2015-03-16 MED ORDER — AMIODARONE IV BOLUS ONLY 150 MG/100ML
150.0000 mg | Freq: Once | INTRAVENOUS | Status: AC
  Administered 2015-03-16: 150 mg via INTRAVENOUS
  Filled 2015-03-16: qty 100

## 2015-03-16 MED ORDER — ENOXAPARIN SODIUM 30 MG/0.3ML ~~LOC~~ SOLN: 30.0000 mg | SUBCUTANEOUS | Status: DC

## 2015-03-16 MED ORDER — FE FUMARATE-B12-VIT C-FA-IFC PO CAPS
1.0000 | ORAL_CAPSULE | Freq: Three times a day (TID) | ORAL | Status: DC
  Administered 2015-03-16 – 2015-03-25 (×27): 1 via ORAL
  Filled 2015-03-16 (×29): qty 1

## 2015-03-16 MED ORDER — SORBITOL 70 % SOLN
60.0000 mL | Freq: Once | Status: AC
  Administered 2015-03-16: 60 mL via ORAL
  Filled 2015-03-16: qty 60

## 2015-03-16 MED ORDER — NOREPINEPHRINE BITARTRATE 1 MG/ML IV SOLN
1.0000 ug/min | INTRAVENOUS | Status: DC
  Administered 2015-03-16 (×2): 4 ug/min via INTRAVENOUS
  Administered 2015-03-16: 2 ug/min via INTRAVENOUS
  Administered 2015-03-17: 5 ug/min via INTRAVENOUS
  Administered 2015-03-18: 1 ug/min via INTRAVENOUS
  Filled 2015-03-16 (×5): qty 4

## 2015-03-16 MED ORDER — INSULIN ASPART 100 UNIT/ML ~~LOC~~ SOLN: 0.0000 [IU] | SUBCUTANEOUS | Status: DC

## 2015-03-16 MED ORDER — INSULIN ASPART 100 UNIT/ML ~~LOC~~ SOLN
0.0000 [IU] | Freq: Three times a day (TID) | SUBCUTANEOUS | Status: DC
  Administered 2015-03-16 – 2015-03-25 (×16): 2 [IU] via SUBCUTANEOUS

## 2015-03-16 NOTE — Progress Notes (Signed)
PT Cancellation Note  Patient Details Name: [name] [name] MRN: [id number] DOB: January 05, 1940   Cancelled Treatment:    Reason Eval/Treat Not Completed: Patient at procedure or test/unavailable Pt off floor at IR. Will follow up.   [name] 03/16/2015, 11:05 AM  Wray Kearns, PT, DPT [phone]

## 2015-03-16 NOTE — Progress Notes (Signed)
Patient is on Neo at 30 mcg. Current BP is 96/55, Map 67. Denied any pain. NSR. Will continue to monitor.

## 2015-03-16 NOTE — Progress Notes (Signed)
      [address]       [place] [address]             [phone]      Feels well  BP 115/53 mmHg  Pulse 94  Temp(Src) 97.7 F (36.5 C) (Oral)  Resp 26  Ht 5\' 11"  (1.803 m)  Wt 218 lb 4.1 oz (99 kg)  BMI 30.45 kg/m2  SpO2 98%   Intake/Output Summary (Last 24 hours) at 03/16/15 1730 Last data filed at 03/16/15 1600  Gross per 24 hour  Intake 1110.1 ml  Output   3050 ml  Net -1939.9 ml   Diuresing well  Continue current care  Sofiah Lyne C. Roxan Hockey, MD [facility] [phone]

## 2015-03-16 NOTE — Progress Notes (Signed)
Patient ID: [name] [name], male   DOB: 06-25-1940, 75 y.o.   MRN: [id number]    Referring Physician(s): Dr. Tharon Aquas Trigt  Supervising Physician: Corrie Mckusick  Chief Complaint: Poor venous access  Subjective: Patient was admitted last week and underwent a CABG.  He has a right IJ line in, but the primary service would like to exchange this out for a tunneled PICC line for prolonged access.  We have been asked to see him for this procedure.  Allergies: Review of patient's allergies indicates no known allergies.  Medications: Prior to Admission medications   Medication Sig Start Date End Date Taking? Authorizing Provider  amLODipine (NORVASC) 5 MG tablet Take 5 mg by mouth daily.  11/08/14  Yes Historical Provider, MD  aspirin EC 81 MG tablet Take 81 mg by mouth daily.     Yes Historical Provider, MD  Cholecalciferol (VITAMIN D3) 2000 UNITS TABS Take 2,000 Units by mouth daily.    Yes Historical Provider, MD  clonazePAM (KLONOPIN) 1 MG tablet Take 1 mg by mouth daily.  11/05/14  Yes Historical Provider, MD  clotrimazole-betamethasone (LOTRISONE) cream Apply 1 application topically as needed (for skin).  11/05/14  Yes Historical Provider, MD  glimepiride (AMARYL) 2 MG tablet Take 2 mg by mouth 2 (two) times daily.     Yes Historical Provider, MD  isosorbide mononitrate (IMDUR) 30 MG 24 hr tablet Take 30 mg by mouth daily.    Yes Historical Provider, MD  Lactobacillus (ACIDOPHILUS) CAPS Take 1 capsule by mouth daily as needed (for immune system).    Yes Historical Provider, MD  losartan (COZAAR) 25 MG tablet Take 25 mg by mouth at bedtime.    Yes Historical Provider, MD  lovastatin (MEVACOR) 20 MG tablet TAKE ONE TABLET BY MOUTH AT BEDTIME Patient taking differently: TAKE 20 MG BY MOUTH AT BEDTIME 12/02/14  Yes Herminio Commons, MD  metroNIDAZOLE (METROGEL) 1 % gel Apply 1 application topically daily.     Yes Historical Provider, MD  nitroGLYCERIN (NITROSTAT) 0.4 MG SL tablet Place 1  tablet (0.4 mg total) under the tongue every 5 (five) minutes as needed. Patient taking differently: Place 0.4 mg under the tongue every 5 (five) minutes as needed for chest pain.  11/14/12  Yes Herminio Commons, MD  sodium polystyrene (KAYEXALATE) 15 GM/60ML suspension Take 30 g by mouth every 30 (thirty) days.    Yes Historical Provider, MD  acetaminophen (TYLENOL) 325 MG tablet Take 2 tablets (650 mg total) by mouth every 4 (four) hours as needed for headache or mild pain. 03/03/15   Isaiah Serge, NP  colchicine 0.6 MG tablet Take 0.6 mg by mouth daily as needed (for gout).     Historical Provider, MD    Vital Signs: BP 93/61 mmHg  Pulse 84  Temp(Src) 98.4 F (36.9 C) (Oral)  Resp 26  Ht 5\' 11"  (1.803 m)  Wt 218 lb 4.1 oz (99 kg)  BMI 30.45 kg/m2  SpO2 98%  Physical Exam: Gen: NAD Heart: regular rate, and rhythm Lungs/chest: CTAB, right IJ in place.  Chest incision healing well  Imaging: Dg Chest Port 1 View  03/16/2015  CLINICAL DATA:  Recent coronary artery bypass grafting for coronary artery disease. EXAM: PORTABLE CHEST 1 VIEW COMPARISON:  March 14, 2015 FINDINGS: Central catheter tip is in the superior vena cava. No chest tubes are currently evident. No pneumothorax is appreciable. There is no edema or consolidation. There is mild left base atelectasis. Heart is mildly  enlarged with pulmonary vascularity within normal limits. No adenopathy. Patient is status post coronary artery bypass grafting IMPRESSION: Mild left base atelectasis. No pneumothorax. Stable cardiac prominence. Electronically Signed   By: Lowella Grip III M.D.   On: 03/16/2015 08:08   Dg Chest Port 1 View  03/14/2015  CLINICAL DATA:  Chest tube in place, shortness of breath EXAM: PORTABLE CHEST 1 VIEW COMPARISON:  03/13/2015 FINDINGS: No chest tube is visualized. Mild left basilar atelectasis.  No pleural effusion or pneumothorax. Cardiomegaly.  Postsurgical changes related to prior CABG. Right IJ venous  sheath. Median sternotomy. IMPRESSION: No chest tube is visualized.  No pneumothorax. Mild basilar atelectasis. Electronically Signed   By: Julian Hy M.D.   On: 03/14/2015 09:41   Dg Chest Port 1 View  03/13/2015  CLINICAL DATA:  Status post CABG 3 days ago EXAM: PORTABLE CHEST 1 VIEW COMPARISON:  Portable chest x-ray of March 12, 2015 FINDINGS: The lungs remain mildly hypoinflated. Subsegmental atelectasis at the left lung base has improved. Minimal atelectatic changes at the right lung base is less conspicuous as well. The cardiac silhouette remains in it is enlarged but is accentuated by the apical lordotic positioning. The pulmonary vascularity is not clearly engorged. The right internal jugular Cordis sheath projects over the proximal SVC. The sternal wires are intact. IMPRESSION: Mild improvement in pulmonary interstitial edema and bibasilar atelectasis. Today's study is obtained in a poor chronic lordotic projection which accentuates the cardiac silhouette. There is no pneumothorax. Electronically Signed   By: David  Martinique M.D.   On: 03/13/2015 07:37    Labs:  CBC:  Recent Labs  03/13/15 0500 03/14/15 0415 03/15/15 0405 03/16/15 0544  WBC 12.4* 13.0* 10.6* 9.3  HGB 9.3* 8.8* 8.3* 8.5*  HCT 29.1* 27.3* 25.8* 26.0*  PLT 223 243 240 299    COAGS:  Recent Labs  05/27/14 1327 03/02/15 0618 03/09/15 0827 03/10/15 0613 03/10/15 1445  INR 1.12 1.07 1.06  --  1.48  APTT 33  --  72* 35 36    BMP:  Recent Labs  03/13/15 0500 03/14/15 0415 03/15/15 0405 03/16/15 0544  NA 139 134* 133* 137  K 4.0 3.9 4.3 4.3  CL 109 103 103 103  CO2 22 22 22 22   GLUCOSE 109* 128* 130* 108*  BUN 34* 40* 44* 52*  CALCIUM 8.5* 8.4* 8.5* 9.0  CREATININE 2.71* 2.61* 2.68* 3.07*  GFRNONAA 21* 22* 22* 18*  GFRAA 25* 26* 25* 21*    LIVER FUNCTION TESTS:  Recent Labs  05/27/14 1327 03/03/15 0552 03/09/15 0827 03/13/15 0500  BILITOT 0.5 0.8 0.5 0.3  AST 18 13* 22 14*  ALT 17  11* 31 14*  ALKPHOS 72 58 76 51  PROT 6.7 5.3* 5.9* 4.7*  ALBUMIN 3.8 3.1* 3.5 2.6*    Assessment and Plan: 1. S/p CABG with need for prolonged venous access -will plan on tunneled PICC line placement today -Risks and Benefits discussed with the patient including, but not limited to bleeding, infection, pneumothorax, or vessel injury All of the patient's questions were answered, patient is agreeable to proceed. Consent signed and in chart.   Electronically Signed: Henreitta Cea 03/16/2015, 9:19 AM   I spent a total of 15 Minutes at the the patient's bedside AND on the patient's hospital floor or unit, greater than 50% of which was counseling/coordinating care for poor venous access, needs PICC

## 2015-03-16 NOTE — Procedures (Signed)
Interventional Radiology Procedure Note  Procedure: Placement of a right IJ approach double lumen tunneled CV catheter.  Tip is positioned at the superior cavoatrial junction and catheter is ready for immediate use.  Complications: None Recommendations:  - Ok to use catheter - Do not submerge - Routine care   Signed,  Dulcy Fanny. Earleen Newport, DO

## 2015-03-16 NOTE — Progress Notes (Signed)
6 Days Post-Op Procedure(s) (LRB): CORONARY ARTERY BYPASS GRAFTING (CABG) x four  , using left internal mammary artery and bilateral greater saphenous veins harvested endoscopically (N/A) TRANSESOPHAGEAL ECHOCARDIOGRAM (TEE) (N/A) Subjective: Feels well , walking in hall Creatinine rising- acute on chronic renal failure, will get renal consult BP low- has been on neo for 6 days- change to low dose norepi then wean as tolerated Needs new central line Objective: Vital signs in last 24 hours: Temp:  [97.2 F (36.2 C)-98.3 F (36.8 C)] 97.2 F (36.2 C) (03/13 0426) Pulse Rate:  [64-99] 81 (03/13 0700) Cardiac Rhythm:  [-] Normal sinus rhythm (03/13 0400) Resp:  [14-31] 26 (03/13 0700) BP: (78-124)/(44-69) 89/48 mmHg (03/13 0700) SpO2:  [90 %-100 %] 100 % (03/13 0700) Weight:  [218 lb 4.1 oz (99 kg)] 218 lb 4.1 oz (99 kg) (03/13 0645)  Hemodynamic parameters for last 24 hours:  nsr  Intake/Output from previous day: 03/12 0701 - 03/13 0700 In: 2086.5 [P.O.:980; I.V.:1106.5] Out: 3000 [Urine:3000] Intake/Output this shift:        Exam    General- alert and comfortable   Lungs- clear without rales, wheezes   Cor- regular rate and rhythm, no murmur , gallop   Abdomen- soft, non-tender   Extremities - warm, non-tender, minimal edema   Neuro- oriented, appropriate, no focal weakness    Lab Results:  Recent Labs  03/15/15 0405 03/16/15 0544  WBC 10.6* 9.3  HGB 8.3* 8.5*  HCT 25.8* 26.0*  PLT 240 299   BMET:  Recent Labs  03/15/15 0405 03/16/15 0544  NA 133* 137  K 4.3 4.3  CL 103 103  CO2 22 22  GLUCOSE 130* 108*  BUN 44* 52*  CREATININE 2.68* 3.07*  CALCIUM 8.5* 9.0    PT/INR: No results for input(s): LABPROT, INR in the last 72 hours. ABG    Component Value Date/Time   PHART 7.327* 03/11/2015 0030   HCO3 21.4 03/11/2015 0030   TCO2 21 03/12/2015 1552   ACIDBASEDEF 4.0* 03/11/2015 0030   O2SAT 59.4 03/16/2015 0701   CBG (last 3)   Recent Labs  03/15/15 2001 03/16/15 0033 03/16/15 0424  GLUCAP 120* 111* 93    Assessment/Plan: S/P Procedure(s) (LRB): CORONARY ARTERY BYPASS GRAFTING (CABG) x four  , using left internal mammary artery and bilateral greater saphenous veins harvested endoscopically (N/A) TRANSESOPHAGEAL ECHOCARDIOGRAM (TEE) (N/A) Cont renal dopamine, renal consult Add po iron Keep in ICU  LOS: 6 days    [name] 03/16/2015

## 2015-03-16 NOTE — Progress Notes (Signed)
Utilization Review Completed.  

## 2015-03-16 NOTE — Progress Notes (Signed)
Physical Therapy Treatment Patient Details Name: [name] [name] MRN: [id number] DOB: 1940/05/11 Today's Date: 03/16/2015    History of Present Illness Patient is a 75 y/o male with hx of CAD, HLD, CKD, HTN, DM and gout presents s/p CABG x4.    PT Comments    Patient progressing slowly towards PT goals. Continues to have some difficulty with bed mobility requiring mod A to get to seated position. Requires cues to adhere to sternal precautions during mobility. HR up to 132 bpm during ambulation. Encouraged ambulation again later with RN. Will continue to follow.   Follow Up Recommendations  SNF;Supervision for mobility/OOB     Equipment Recommendations  Rolling walker with 5" wheels    Recommendations for Other Services       Precautions / Restrictions Precautions Precautions: Sternal;Fall Restrictions Weight Bearing Restrictions: No    Mobility  Bed Mobility Overal bed mobility: Needs Assistance Bed Mobility: Rolling;Sidelying to Sit Rolling: Min guard Sidelying to sit: Mod assist;HOB elevated       General bed mobility comments: Cues for log roll technique. Assist with trunk elevation.  Transfers Overall transfer level: Needs assistance Equipment used:  (pushing w/c.) Transfers: Sit to/from Stand Sit to Stand: Min assist         General transfer comment: Cues for technique and anterior weight shift. Min A to boost holding heart pillow.  Ambulation/Gait Ambulation/Gait assistance: Supervision Ambulation Distance (Feet): 300 Feet Assistive device:  (pushing IV) Gait Pattern/deviations: Step-through pattern;Decreased stride length Gait velocity: decreased   General Gait Details: Slow, steady gait HR up to 132 bpm. Sp02 dropping to high 80s on RA. Cues for pursed lip breathing. Transferred to [facility] post ambulation.   Stairs            Wheelchair Mobility    Modified Rankin (Stroke Patients Only)       Balance Overall balance assessment: Needs  assistance Sitting-balance support: Feet supported;No upper extremity supported Sitting balance-Leahy Scale: Good     Standing balance support: During functional activity Standing balance-Leahy Scale: Fair                      Cognition Arousal/Alertness: Awake/alert Behavior During Therapy: WFL for tasks assessed/performed Overall Cognitive Status: Within Functional Limits for tasks assessed                      Exercises      General Comments        Pertinent Vitals/Pain Pain Assessment: No/denies pain    Home Living                      Prior Function            PT Goals (current goals can now be found in the care plan section) Progress towards PT goals: Progressing toward goals    Frequency  Min 3X/week    PT Plan Current plan remains appropriate    Co-evaluation             End of Session Equipment Utilized During Treatment: Gait belt Activity Tolerance: Patient tolerated treatment well Patient left: Other (comment);with call bell/phone within reach;with nursing/sitter in room (sitting on [facility]     Time: [id number] PT Time Calculation (min) (ACUTE ONLY): 19 min  Charges:  $Gait Training: 8-22 mins                    G Codes:  Marguarite Arbour A Leona Pressly 03/16/2015, 2:26 PM Wray Kearns, [place], DPT [phone]

## 2015-03-16 NOTE — Consult Note (Signed)
[name] [name] Admit Date: 03/10/2015 03/16/2015 [name] [name] Requesting Physician:  [name] [name]  Reason for Consult:  AoCKD, s/p CABG HPI:  32M seen at the request of Dr. [place] [name] for the evaluation of elevated serum creatinine.  Pt follows with [name] [name] and tells me last SCr was 2.6 as outpatient. He was diagnosed with multivessel coronary artery disease on catheterization 03/02/15. He underwent 4 vessel CABG on 03/10/14, at baseline creatinine at the time of surgery. He has not received nonsteroidals, IV contrast, prolonged antibiotics. He has had intermittent doses of furosemide. Postoperatively he has been hypotensive requiring phenylephrine and dopamine. He has made excellent urine volume. His creatinine has slowly increased now to evaluate 3.07 as listed below.  His outpatient ARB was held at the time of surgery.   CREATININE, SER (mg/dL)  Date Value  03/16/2015 3.07*  03/15/2015 2.68*  03/14/2015 2.61*  03/13/2015 2.71*  03/12/2015 2.50*  03/12/2015 2.48*  03/11/2015 2.00*  03/11/2015 2.25*  03/11/2015 1.94*  03/10/2015 1.80*  ] I/Os: I/O last 3 completed shifts: In: 2603.5 [P.O.:980; I.V.:1623.5] Out: 3650 [Urine:3650]  ROS NSAIDS: no exposure IV Contrast no exposure TMP/SMX no exposure Hypotension: present Balance of 12 systems is negative w/ exceptions as above  PMH  Past Medical History  Diagnosis Date  . Coronary artery disease     quiescent on medical therapy,abnormal exercise Cardiolite suggestive of apical ischemia;EF 54%,02/10  . Hyperlipidemia     abnormal LFT's,resolved,secondary to lipitor  . Chronic kidney disease     chronic renal insufficiency,single functional kidney  . Tobacco abuse   . Penile cancer ([place])     history of penile cancer/squamous cell  . Hypertension   . Shortness of breath dyspnea     with exertion  . Diabetes mellitus without complication (HCC)     Type 2  . Arthritis   . Gout   . Abnormal nuclear stress test    [name] [name]  Past  Surgical History  Procedure Laterality Date  . Bladder surgery  1980  . Penectomy      surgery for carcinoma of penis  . 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 Cardiac catheterization N/A 03/02/2015    Procedure: Left Heart Cath and Coronary Angiography;  Surgeon: [name] Blanks, MD;  Location: [facility];  Service: Cardiovascular;  Laterality: N/A;  . Cardiac catheterization N/A 03/02/2015    Procedure: Intravascular Ultrasound/IVUS;  Surgeon: [name] Blanks, MD;  Location: [facility];  Service: Cardiovascular;  Laterality: N/A;  . Coronary artery bypass graft N/A 03/10/2015    Procedure: CORONARY ARTERY BYPASS GRAFTING (CABG) x four  , using left internal mammary artery and bilateral greater saphenous veins harvested endoscopically;  Surgeon: [name] Poot, MD;  Location: [facility];  Service: Open Heart Surgery;  Laterality: N/A;  . Tee without cardioversion N/A 03/10/2015    Procedure: TRANSESOPHAGEAL ECHOCARDIOGRAM (TEE);  Surgeon: [name] Poot, MD;  Location: [facility];  Service: Open Heart Surgery;  Laterality: N/A;   FH  Family History  Problem Relation Age of Onset  . Liver disease Mother   . Gallbladder disease Father   . Heart attack Sister   . Bone cancer Sister    SH  reports that he quit smoking about 45 years ago. His smoking use included Cigarettes. He started smoking about 60 years ago. He has a 25 pack-year smoking history. He has never used smokeless tobacco. He reports that he does not  drink alcohol or use illicit drugs. Allergies No Known Allergies Home medications Prior to Admission medications   Medication Sig Start Date End Date Taking? Authorizing Provider  amLODipine (NORVASC) 5 MG tablet Take 5 mg by mouth daily.  11/08/14  Yes Historical Provider, MD  aspirin EC 81 MG tablet Take 81 mg by mouth daily.     Yes Historical Provider, MD  Cholecalciferol (VITAMIN D3) 2000 UNITS TABS Take 2,000 Units by mouth daily.    Yes  Historical Provider, MD  clonazePAM (KLONOPIN) 1 MG tablet Take 1 mg by mouth daily.  11/05/14  Yes Historical Provider, MD  clotrimazole-betamethasone (LOTRISONE) cream Apply 1 application topically as needed (for skin).  11/05/14  Yes Historical Provider, MD  glimepiride (AMARYL) 2 MG tablet Take 2 mg by mouth 2 (two) times daily.     Yes Historical Provider, MD  isosorbide mononitrate (IMDUR) 30 MG 24 hr tablet Take 30 mg by mouth daily.    Yes Historical Provider, MD  Lactobacillus (ACIDOPHILUS) CAPS Take 1 capsule by mouth daily as needed (for immune system).    Yes Historical Provider, MD  losartan (COZAAR) 25 MG tablet Take 25 mg by mouth at bedtime.    Yes Historical Provider, MD  lovastatin (MEVACOR) 20 MG tablet TAKE ONE TABLET BY MOUTH AT BEDTIME Patient taking differently: TAKE 20 MG BY MOUTH AT BEDTIME 12/02/14  Yes [name] Commons, MD  metroNIDAZOLE (METROGEL) 1 % gel Apply 1 application topically daily.     Yes Historical Provider, MD  nitroGLYCERIN (NITROSTAT) 0.4 MG SL tablet Place 1 tablet (0.4 mg total) under the tongue every 5 (five) minutes as needed. Patient taking differently: Place 0.4 mg under the tongue every 5 (five) minutes as needed for chest pain.  11/14/12  Yes [name] Commons, MD  sodium polystyrene (KAYEXALATE) 15 GM/60ML suspension Take 30 g by mouth every 30 (thirty) days.    Yes Historical Provider, MD  acetaminophen (TYLENOL) 325 MG tablet Take 2 tablets (650 mg total) by mouth every 4 (four) hours as needed for headache or mild pain. 03/03/15   [name] Serge, NP  colchicine 0.6 MG tablet Take 0.6 mg by mouth daily as needed (for gout).     Historical Provider, MD    Current Medications Scheduled Meds: . amiodarone  200 mg Oral BID  . antiseptic oral rinse  7 mL Mouth Rinse BID  . aspirin EC  325 mg Oral Daily   Or  . aspirin  324 mg Per Tube Daily  . bisacodyl  10 mg Oral Daily   Or  . bisacodyl  10 mg Rectal Daily  . cholecalciferol  2,000 Units  Oral Daily  . clonazePAM  1 mg Oral Daily  . docusate sodium  200 mg Oral Daily  . enoxaparin (LOVENOX) injection  30 mg Subcutaneous Q24H  . ferrous Q000111Q C-folic acid  1 capsule Oral TID PC  . insulin aspart  0-24 Units Subcutaneous 6 times per day  . insulin detemir  12 Units Subcutaneous BID  . metoprolol tartrate  12.5 mg Oral BID   Or  . metoprolol tartrate  12.5 mg Per Tube BID  . pantoprazole  40 mg Oral Daily  . pneumococcal 23 valent vaccine  0.5 mL Intramuscular Once  . pravastatin  20 mg Oral q1800  . sodium chloride flush  3 mL Intravenous Q12H  . sorbitol  60 mL Oral Once   Continuous Infusions: . sodium chloride Stopped (03/11/15 1000)  . sodium  chloride    . sodium chloride Stopped (03/11/15 1000)  . DOPamine 2.5 mcg/kg/min (03/16/15 1000)  . lactated ringers 20 mL/hr at 03/13/15 2000  . lactated ringers 20 mL/hr at 03/16/15 1000  . norepinephrine (LEVOPHED) Adult infusion 4 mcg/min (03/16/15 0948)   PRN Meds:.sodium chloride, clotrimazole-betamethasone, colchicine, metoprolol, ondansetron (ZOFRAN) IV, oxyCODONE, sodium chloride flush, traMADol  CBC  Recent Labs Lab 03/14/15 0415 03/15/15 0405 03/16/15 0544  WBC 13.0* 10.6* 9.3  HGB 8.8* 8.3* 8.5*  HCT 27.3* 25.8* 26.0*  MCV 94.5 93.1 92.9  PLT 243 240 [id number]   Basic Metabolic Panel  Recent Labs Lab 03/11/15 0435  03/11/15 1731 03/12/15 0417 03/12/15 1552 03/13/15 0500 03/14/15 0415 03/15/15 0405 03/16/15 0544  NA 142  --  139 140 136 139 134* 133* 137  K 4.5  --  5.4* 4.4 4.3 4.0 3.9 4.3 4.3  CL 115*  --  108 106 101 109 103 103 103  CO2 23  --   --  22  --  22 22 22 22   GLUCOSE 115*  --  197* 158* 275* 109* 128* 130* 108*  BUN 23*  --  36* 27* 31* 34* 40* 44* 52*  CREATININE 1.94*  < > 2.00* 2.48* 2.50* 2.71* 2.61* 2.68* 3.07*  CALCIUM 8.4*  --   --  8.6*  --  8.5* 8.4* 8.5* 9.0  < > = values in this interval not displayed.  Physical Exam  Blood pressure 124/64, pulse 76,  temperature 98.4 F (36.9 C), temperature source Oral, resp. rate 18, height 5\' 11"  (1.803 m), weight 99 kg (218 lb 4.1 oz), SpO2 100 %. GEN: NAD ENT: NCAT EYES: EOMI. Glasses on CV: RRR PULM: CTAB ABD: s/nt/nd SKIN: no rashes/lesions, sternotomy cdi [id number]   Assessment 22M with nonoliguric AoCKD3 following 4V CABG 3/7 and prolonged hypotension postoperative  1. AoCKD3, nonoliguric, no obvious nephrotoxins; BL SCr 2.6 sees [name] [name] 2. 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 ASCVD, hx/o ASx CAS followed by [facility] 4. Hypotension on dopamin and NE 5. HLD 6. Gout 7. Tobacco user  Plan 1. Renal US to eval for structural issues 2. Cont BP support as current 3. Check UA and UP/C 4. Consider transfusion for Hb < 8 Daily weights, Daily Renal Panel, Strict I/Os, Avoid nephrotoxins (NSAIDs, judicious IV Contrast)  Will follow along, no RRT indications, expect will recover with time  Pearson Grippe MD [phone] pgr 03/16/2015, 10:45 AM

## 2015-03-17 ENCOUNTER — Inpatient Hospital Stay (HOSPITAL_COMMUNITY): Payer: Medicare HMO

## 2015-03-17 LAB — COMPREHENSIVE METABOLIC PANEL
ALT: 11 U/L — ABNORMAL LOW (ref 17–63)
AST: 12 U/L — ABNORMAL LOW (ref 15–41)
Albumin: 2.6 g/dL — ABNORMAL LOW (ref 3.5–5.0)
Alkaline Phosphatase: 51 U/L (ref 38–126)
Anion gap: 12 (ref 5–15)
BUN: 52 mg/dL — ABNORMAL HIGH (ref 6–20)
CO2: 22 mmol/L (ref 22–32)
Calcium: 9.2 mg/dL (ref 8.9–10.3)
Chloride: 108 mmol/L (ref 101–111)
Creatinine, Ser: 2.92 mg/dL — ABNORMAL HIGH (ref 0.61–1.24)
GFR calc Af Amer: 23 mL/min — ABNORMAL LOW (ref >60)
GFR calc non Af Amer: 19 mL/min — ABNORMAL LOW (ref >60)
Glucose, Bld: 138 mg/dL — ABNORMAL HIGH (ref 65–99)
Potassium: 4.1 mmol/L (ref 3.5–5.1)
Sodium: 142 mmol/L (ref 135–145)
Total Bilirubin: 0.4 mg/dL (ref 0.3–1.2)
Total Protein: 5 g/dL — ABNORMAL LOW (ref 6.5–8.1)

## 2015-03-17 LAB — CARBOXYHEMOGLOBIN
Carboxyhemoglobin: 1.1 % (ref 0.5–1.5)
Methemoglobin: 0.8 % (ref 0.0–1.5)
O2 Saturation: 70.8 %
Total hemoglobin: 15.3 g/dL (ref 13.5–18.0)

## 2015-03-17 LAB — CBC
HCT: 27.1 % — ABNORMAL LOW (ref 39.0–52.0)
Hemoglobin: 8.6 g/dL — ABNORMAL LOW (ref 13.0–17.0)
MCH: 30.1 pg (ref 26.0–34.0)
MCHC: 31.7 g/dL (ref 30.0–36.0)
MCV: 94.8 fL (ref 78.0–100.0)
Platelets: 324 10*3/uL (ref 150–400)
RBC: 2.86 MIL/uL — ABNORMAL LOW (ref 4.22–5.81)
RDW: 13.3 % (ref 11.5–15.5)
WBC: 8.8 10*3/uL (ref 4.0–10.5)

## 2015-03-17 LAB — GLUCOSE, CAPILLARY
Glucose-Capillary: 142 mg/dL — ABNORMAL HIGH (ref 65–99)
Glucose-Capillary: 125 mg/dL — ABNORMAL HIGH (ref 65–99)
Glucose-Capillary: 120 mg/dL — ABNORMAL HIGH (ref 65–99)
Glucose-Capillary: 106 mg/dL — ABNORMAL HIGH (ref 65–99)

## 2015-03-17 NOTE — Progress Notes (Signed)
Physical Therapy Treatment Patient Details Name: JAIDAN FAGOT MRN: [id number] DOB: Oct 08, 1940 Today's Date: 03/17/2015    History of Present Illness Patient is a 75 y/o male with hx of CAD, HLD, CKD, HTN, DM and gout presents s/p CABG x4.    PT Comments    Patient progressing well towards PT goals. Continues to require Min A to stand from low surfaces due to inability to use UEs. Focused on functional strengthening of BLEs practicing sit to stands. Gait speed improving. Sp02 continues to drop with activity. Will focus on bed mobility next session as tolerated.   Follow Up Recommendations  SNF;Supervision for mobility/OOB     Equipment Recommendations  Rolling walker with 5" wheels    Recommendations for Other Services       Precautions / Restrictions Precautions Precautions: Sternal;Fall Restrictions Weight Bearing Restrictions: No    Mobility  Bed Mobility               General bed mobility comments: Sitting in chair upon PT arrival.   Transfers Overall transfer level: Needs assistance Equipment used:  (pushing w/c.) Transfers: Sit to/from Stand Sit to Stand: Min assist         General transfer comment: Cues for technique and anterior weight shift. Min A to boost holding heart pillow.  Ambulation/Gait Ambulation/Gait assistance: Supervision Ambulation Distance (Feet): 300 Feet Assistive device:  (pushing w/c.) Gait Pattern/deviations: Step-through pattern;Decreased stride length;Trunk flexed   Gait velocity interpretation: at or above normal speed for age/gender General Gait Details: Increasing speed during gait HR up to 116 bpm. Sp02 dropping to high 80s on RA. 2 short standing rest breaks due to drop in SP02. Cues for pursed lip breathing.    Stairs            Wheelchair Mobility    Modified Rankin (Stroke Patients Only)       Balance Overall balance assessment: Needs assistance Sitting-balance support: Feet supported;No upper  extremity supported Sitting balance-Leahy Scale: Good     Standing balance support: During functional activity Standing balance-Leahy Scale: Fair                      Cognition Arousal/Alertness: Awake/alert Behavior During Therapy: WFL for tasks assessed/performed Overall Cognitive Status: Within Functional Limits for tasks assessed                      Exercises Other Exercises Other Exercises: Sit to stand x10 from low chair for strengthening.    General Comments        Pertinent Vitals/Pain Pain Assessment: No/denies pain    Home Living                      Prior Function            PT Goals (current goals can now be found in the care plan section) Progress towards PT goals: Progressing toward goals    Frequency  Min 3X/week    PT Plan Current plan remains appropriate    Co-evaluation             End of Session Equipment Utilized During Treatment: Gait belt Activity Tolerance: Patient tolerated treatment well Patient left: in chair;with call bell/phone within reach     Time: 1416-1437 PT Time Calculation (min) (ACUTE ONLY): 21 min  Charges:  $Gait Training: 8-22 mins  G Codes:      Aziz Slape A Raynesha Tiedt 03/17/2015, 2:48 PM  Wray Kearns, [place], DPT [phone]

## 2015-03-17 NOTE — Progress Notes (Signed)
Admit: 03/10/2015 LOS: 7  [name] [name] with nonoliguric AoCKD3 following 4V CABG 3/7 and prolonged hypotension postoperative  Subjective:  SCr improved overnight At least 1.5L UOP Off dopamine, NE weening    03/13 0701 - 03/14 0700 In: 1191.9 [P.O.:240; I.V.:951.9] Out: 1450 [Urine:1450]  Filed Weights   03/15/15 0600 03/16/15 0645 03/17/15 0500  Weight: 101 kg (222 lb 10.6 oz) 99 kg (218 lb 4.1 oz) 97.2 kg (214 lb 4.6 oz)    Scheduled Meds: . amiodarone  200 mg Oral BID  . antiseptic oral rinse  7 mL Mouth Rinse BID  . aspirin EC  325 mg Oral Daily   Or  . aspirin  324 mg Per Tube Daily  . bisacodyl  10 mg Oral Daily   Or  . bisacodyl  10 mg Rectal Daily  . cholecalciferol  2,000 Units Oral Daily  . clonazePAM  1 mg Oral Daily  . docusate sodium  200 mg Oral Daily  . enoxaparin (LOVENOX) injection  30 mg Subcutaneous Q24H  . ferrous Q000111Q C-folic acid  1 capsule Oral TID PC  . insulin aspart  0-24 Units Subcutaneous TID AC & HS  . insulin detemir  12 Units Subcutaneous BID  . metoprolol tartrate  12.5 mg Oral BID   Or  . metoprolol tartrate  12.5 mg Per Tube BID  . pantoprazole  40 mg Oral Daily  . pneumococcal 23 valent vaccine  0.5 mL Intramuscular Once  . pravastatin  20 mg Oral q1800  . sodium chloride flush  3 mL Intravenous Q12H   Continuous Infusions: . sodium chloride Stopped (03/11/15 1000)  . sodium chloride    . sodium chloride Stopped (03/11/15 1000)  . DOPamine 2.5 mcg/kg/min (03/17/15 0000)  . lactated ringers 20 mL/hr at 03/13/15 2000  . lactated ringers 20 mL/hr at 03/17/15 0000  . norepinephrine (LEVOPHED) Adult infusion 4 mcg/min (03/17/15 0800)   PRN Meds:.sodium chloride, clotrimazole-betamethasone, colchicine, metoprolol, ondansetron (ZOFRAN) IV, oxyCODONE, sodium chloride flush, traMADol  Current Labs: reviewed    Physical Exam:  Blood pressure 106/53, pulse 75, temperature 98.2 F (36.8 C), temperature source Oral, resp. rate 27,  height 5\' 11"  (1.803 m), weight 97.2 kg (214 lb 4.6 oz), SpO2 95 %. GEN: NAD ENT: NCAT EYES: EOMI. Glasses on CV: RRR PULM: CTAB ABD: s/nt/nd SKIN: no rashes/lesions, sternotomy cdi [id number] LEE  3/14 Renal US: pending 3/13 UP/C 0.42 3/13 UA neg protein, blood, LE/Nit  A 1. AoCKD3, nonoliguric, no obvious nephrotoxins; BL SCr 2.6 sees [name] 2. 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 ASCVD, hx/o ASx CAS followed by [facility] 4. Hypotension on dopamin and NE 5. HLD 6. Gout 7. Tobacco user  Plan 1. Improving, cont current care and weening as able 2. Daily weights, Daily Renal Panel, Strict I/Os, Avoid nephrotoxins (NSAIDs, judicious IV Contrast)    Pearson Grippe MD 03/17/2015, 8:28 AM   Recent Labs Lab 03/15/15 0405 03/16/15 0544 03/17/15 0603  NA 133* 137 142  K 4.3 4.3 4.1  CL 103 103 108  CO2 22 22 22   GLUCOSE 130* 108* 138*  BUN 44* 52* 52*  CREATININE 2.68* 3.07* 2.92*  CALCIUM 8.5* 9.0 9.2    Recent Labs Lab 03/15/15 0405 03/16/15 0544 03/17/15 0603  WBC 10.6* 9.3 8.8  HGB 8.3* 8.5* 8.6*  HCT 25.8* 26.0* 27.1*  MCV 93.1 92.9 94.8  PLT 240 299 324

## 2015-03-17 NOTE — Progress Notes (Signed)
7 Days Post-Op Procedure(s) (LRB): CORONARY ARTERY BYPASS GRAFTING (CABG) x four  , using left internal mammary artery and bilateral greater saphenous veins harvested endoscopically (N/A) TRANSESOPHAGEAL ECHOCARDIOGRAM (TEE) (N/A) Subjective: Postop multivessel CABG with chronic renal insufficiency Postop hypotension with normal cardiac output and acute on chronic renal insufficiency Creatinine improving, urine output improving, blood pressure improving Weaning off norepinephrine, continuing low-dose dopamine Chest x-ray clear Mild peripheral edema  Objective: Vital signs in last 24 hours: Temp:  [97.4 F (36.3 C)-98.3 F (36.8 C)] 98.3 F (36.8 C) (03/14 1705) Pulse Rate:  [55-109] 73 (03/14 1830) Cardiac Rhythm:  [-] Normal sinus rhythm (03/14 1300) Resp:  [13-33] 22 (03/14 1830) BP: (93-139)/(50-106) 125/61 mmHg (03/14 1830) SpO2:  [69 %-100 %] 97 % (03/14 1830) Weight:  [214 lb 4.6 oz (97.2 kg)] 214 lb 4.6 oz (97.2 kg) (03/14 0500)  Hemodynamic parameters for last 24 hours:   stable  Intake/Output from previous day: 03/13 0701 - 03/14 0700 In: 1191.9 [P.O.:240; I.V.:951.9] Out: 1450 [Urine:1450] Intake/Output this shift:         Exam    General- alert and comfortable   Lungs- clear without rales, wheezes   Cor- regular rate and rhythm, no murmur , gallop   Abdomen- soft, non-tender   Extremities - warm, non-tender, minimal edema   Neuro- oriented, appropriate, no focal weakness   Lab Results:  Recent Labs  03/16/15 0544 03/17/15 0603  WBC 9.3 8.8  HGB 8.5* 8.6*  HCT 26.0* 27.1*  PLT 299 324   BMET:  Recent Labs  03/16/15 0544 03/17/15 0603  NA 137 142  K 4.3 4.1  CL 103 108  CO2 22 22  GLUCOSE 108* 138*  BUN 52* 52*  CREATININE 3.07* 2.92*  CALCIUM 9.0 9.2    PT/INR: No results for input(s): LABPROT, INR in the last 72 hours. ABG    Component Value Date/Time   PHART 7.327* 03/11/2015 0030   HCO3 21.4 03/11/2015 0030   TCO2 21 03/12/2015  1552   ACIDBASEDEF 4.0* 03/11/2015 0030   O2SAT 70.8 03/17/2015 0504   CBG (last 3)   Recent Labs  03/17/15 0834 03/17/15 1221 03/17/15 1704  GLUCAP 142* 125* 120*    Assessment/Plan: S/P Procedure(s) (LRB): CORONARY ARTERY BYPASS GRAFTING (CABG) x four  , using left internal mammary artery and bilateral greater saphenous veins harvested endoscopically (N/A) TRANSESOPHAGEAL ECHOCARDIOGRAM (TEE) (N/A) Mobilize Diuresis Wean norepinephrine   LOS: 7 days    [name] 03/17/2015

## 2015-03-17 NOTE — Care Management Important Message (Signed)
Important Message  Patient Details  Name: [name] [name] MRN: [id number] Date of Birth: 06-21-1940   Medicare Important Message Given:  Yes    Nathen May 03/17/2015, 11:34 AM

## 2015-03-18 LAB — CBC
HCT: 25.7 % — ABNORMAL LOW (ref 39.0–52.0)
Hemoglobin: 8.4 g/dL — ABNORMAL LOW (ref 13.0–17.0)
MCH: 31.2 pg (ref 26.0–34.0)
MCHC: 32.7 g/dL (ref 30.0–36.0)
MCV: 95.5 fL (ref 78.0–100.0)
Platelets: 353 10*3/uL (ref 150–400)
RBC: 2.69 MIL/uL — ABNORMAL LOW (ref 4.22–5.81)
RDW: 13.6 % (ref 11.5–15.5)
WBC: 8.3 10*3/uL (ref 4.0–10.5)

## 2015-03-18 LAB — COMPREHENSIVE METABOLIC PANEL
ALT: 13 U/L — ABNORMAL LOW (ref 17–63)
AST: 15 U/L (ref 15–41)
Albumin: 2.5 g/dL — ABNORMAL LOW (ref 3.5–5.0)
Alkaline Phosphatase: 48 U/L (ref 38–126)
Anion gap: 8 (ref 5–15)
BUN: 48 mg/dL — ABNORMAL HIGH (ref 6–20)
CO2: 23 mmol/L (ref 22–32)
Calcium: 8.9 mg/dL (ref 8.9–10.3)
Chloride: 112 mmol/L — ABNORMAL HIGH (ref 101–111)
Creatinine, Ser: 2.72 mg/dL — ABNORMAL HIGH (ref 0.61–1.24)
GFR calc Af Amer: 25 mL/min — ABNORMAL LOW (ref >60)
GFR calc non Af Amer: 21 mL/min — ABNORMAL LOW (ref >60)
Glucose, Bld: 117 mg/dL — ABNORMAL HIGH (ref 65–99)
Potassium: 4.4 mmol/L (ref 3.5–5.1)
Sodium: 143 mmol/L (ref 135–145)
Total Bilirubin: 0.6 mg/dL (ref 0.3–1.2)
Total Protein: 5 g/dL — ABNORMAL LOW (ref 6.5–8.1)

## 2015-03-18 LAB — GLUCOSE, CAPILLARY
Glucose-Capillary: 107 mg/dL — ABNORMAL HIGH (ref 65–99)
Glucose-Capillary: 124 mg/dL — ABNORMAL HIGH (ref 65–99)
Glucose-Capillary: 93 mg/dL (ref 65–99)
Glucose-Capillary: 127 mg/dL — ABNORMAL HIGH (ref 65–99)

## 2015-03-18 MED ORDER — FUROSEMIDE 10 MG/ML IJ SOLN
40.0000 mg | Freq: Two times a day (BID) | INTRAMUSCULAR | Status: DC
  Administered 2015-03-18 – 2015-03-20 (×5): 40 mg via INTRAVENOUS
  Filled 2015-03-18 (×5): qty 4

## 2015-03-18 NOTE — Progress Notes (Signed)
CSW engaged with both [facility] and [facility] re: bed offers. [facility] reports that they are out of contract with [employer] at this time. [facility] reports that they have beds and can move forward with authorization pending Patient agree to [facility]. CSW engaged with Patient who reports that [facility] is fine but he would prefer a private room. CSW agreed to call [facility] to inquire about private room availability. Per Shirlean Mylar at admissions, they do not currently have private rooms at this time. CSW informed her to go ahead and run Patient's auth and move forward with placement. CSW will continue to follow for disposition.   Holly Bodily, LCSW [facility] Clinical Social Worker [phone]

## 2015-03-18 NOTE — Progress Notes (Signed)
Admit: 03/10/2015 LOS: 8  29M with nonoliguric AoCKD3 following 4V CABG 3/7 and prolonged hypotension postoperative  Subjective:  SCr back to outpt baseline Very low pressor req Good UOP Feels well Legs are edematous    03/14 0701 - 03/15 0700 In: 1609.6 [P.O.:840; I.V.:769.6] Out: 900 [Urine:900]  Filed Weights   03/16/15 0645 03/17/15 0500 03/18/15 0500  Weight: 99 kg (218 lb 4.1 oz) 97.2 kg (214 lb 4.6 oz) 97 kg (213 lb 13.5 oz)    Scheduled Meds: . amiodarone  200 mg Oral BID  . antiseptic oral rinse  7 mL Mouth Rinse BID  . aspirin EC  325 mg Oral Daily   Or  . aspirin  324 mg Per Tube Daily  . bisacodyl  10 mg Oral Daily   Or  . bisacodyl  10 mg Rectal Daily  . cholecalciferol  2,000 Units Oral Daily  . clonazePAM  1 mg Oral Daily  . docusate sodium  200 mg Oral Daily  . enoxaparin (LOVENOX) injection  30 mg Subcutaneous Q24H  . ferrous Q000111Q C-folic acid  1 capsule Oral TID PC  . insulin aspart  0-24 Units Subcutaneous TID AC & HS  . insulin detemir  12 Units Subcutaneous BID  . metoprolol tartrate  12.5 mg Oral BID   Or  . metoprolol tartrate  12.5 mg Per Tube BID  . pantoprazole  40 mg Oral Daily  . pneumococcal 23 valent vaccine  0.5 mL Intramuscular Once  . pravastatin  20 mg Oral q1800  . sodium chloride flush  3 mL Intravenous Q12H   Continuous Infusions: . sodium chloride Stopped (03/11/15 1000)  . sodium chloride    . sodium chloride 250 mL (03/18/15 0233)  . DOPamine 2.5 mcg/kg/min (03/17/15 1800)  . lactated ringers 20 mL/hr at 03/13/15 2000  . lactated ringers 20 mL/hr at 03/17/15 0000  . norepinephrine (LEVOPHED) Adult infusion 1 mcg/min (03/18/15 0037)   PRN Meds:.sodium chloride, clotrimazole-betamethasone, colchicine, metoprolol, ondansetron (ZOFRAN) IV, oxyCODONE, sodium chloride flush, traMADol  Current Labs: reviewed    Physical Exam:  Blood pressure 104/64, pulse 66, temperature 98.5 F (36.9 C), temperature source  Oral, resp. rate 17, height 5\' 11"  (1.803 m), weight 97 kg (213 lb 13.5 oz), SpO2 95 %. GEN: NAD ENT: NCAT EYES: EOMI. Glasses on CV: RRR PULM: CTAB ABD: s/nt/nd SKIN: no rashes/lesions, sternotomy cdi [id number] LEE  3/14 Renal US: R 8.8 with atrophy no HN; L 12.5 with atrophy no HN 3/13 UP/C 0.42 3/13 UA neg protein, blood, LE/Nit  A 1. AoCKD3, nonoliguric, no obvious nephrotoxins; BL SCr 2.6 sees [name] 2. 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 B/l LEE 4. ASCVD, hx/o ASx CAS followed by [facility] 5. Hypotension on dopamin and NE 6. HLD 7. Gout 8. Tobacco user  Plan 1. Back to baseline 2. Start BID lasix 40 IV BID 3. Will step back with recovered renal function.  Please call with any questions or concerns.  Pt will need f/u with Dr Lowanda Foster upon DC 4. Cont daily weights, Daily Renal Panel, Strict I/Os, Avoid nephrotoxins (NSAIDs, judicious IV Contrast)    Pearson Grippe MD 03/18/2015, 8:25 AM   Recent Labs Lab 03/16/15 0544 03/17/15 0603 03/18/15 0642  NA 137 142 143  K 4.3 4.1 4.4  CL 103 108 112*  CO2 22 22 23   GLUCOSE 108* 138* 117*  BUN 52* 52* 48*  CREATININE 3.07* 2.92* 2.72*  CALCIUM 9.0 9.2 8.9    Recent Labs Lab 03/16/15 0544  03/17/15 0603 03/18/15 0642  WBC 9.3 8.8 8.3  HGB 8.5* 8.6* 8.4*  HCT 26.0* 27.1* 25.7*  MCV 92.9 94.8 95.5  PLT 299 324 353

## 2015-03-18 NOTE — Progress Notes (Signed)
8 Days Post-Op Procedure(s) (LRB): CORONARY ARTERY BYPASS GRAFTING (CABG) x four  , using left internal mammary artery and bilateral greater saphenous veins harvested endoscopically (N/A) TRANSESOPHAGEAL ECHOCARDIOGRAM (TEE) (N/A) Sub  Feels well BP better of norepi Renal fx back to baseline tx to stepdown  Objective: Vital signs in last 24 hours: Temp:  [97.8 F (36.6 C)-98.5 F (36.9 C)] 97.8 F (36.6 C) (03/15 0700) Pulse Rate:  [55-98] 66 (03/15 0700) Cardiac Rhythm:  [-] Normal sinus rhythm (03/15 0400) Resp:  [13-27] 17 (03/15 0700) BP: (93-139)/(51-81) 104/64 mmHg (03/15 0700) SpO2:  [69 %-100 %] 95 % (03/15 0700) Weight:  [213 lb 13.5 oz (97 kg)] 213 lb 13.5 oz (97 kg) (03/15 0500)  Hemodynamic parameters for last 24 hours:  nsr  Intake/Output from previous day: 03/14 0701 - 03/15 0700 In: 1609.6 [P.O.:840; I.V.:769.6] Out: 900 [Urine:900] Intake/Output this shift:   Exam Mild 2+ pedal edema Lungs clear abd soft   Lab Results:  Recent Labs  03/17/15 0603 03/18/15 0642  WBC 8.8 8.3  HGB 8.6* 8.4*  HCT 27.1* 25.7*  PLT 324 353   BMET:  Recent Labs  03/17/15 0603 03/18/15 0642  NA 142 143  K 4.1 4.4  CL 108 112*  CO2 22 23  GLUCOSE 138* 117*  BUN 52* 48*  CREATININE 2.92* 2.72*  CALCIUM 9.2 8.9    PT/INR: No results for input(s): LABPROT, INR in the last 72 hours. ABG    Component Value Date/Time   PHART 7.327* 03/11/2015 0030   HCO3 21.4 03/11/2015 0030   TCO2 21 03/12/2015 1552   ACIDBASEDEF 4.0* 03/11/2015 0030   O2SAT 70.8 03/17/2015 0504   CBG (last 3)   Recent Labs  03/17/15 1704 03/17/15 2132 03/18/15 0823  GLUCAP 120* 106* 107*    Assessment/Plan: S/P Procedure(s) (LRB): CORONARY ARTERY BYPASS GRAFTING (CABG) x four  , using left internal mammary artery and bilateral greater saphenous veins harvested endoscopically (N/A) TRANSESOPHAGEAL ECHOCARDIOGRAM (TEE) (N/A) Cont renal dopamine w/o titration for 24 hr tx to  stepdown Short course of IV lasix per renal Needs plan for SNF in [employer]   LOS: 8 days    [name] 03/18/2015

## 2015-03-18 NOTE — Progress Notes (Signed)
Pt transferred from 2S. Pt oriented to room. Vital signs stable. Call bell and phone within reach. No changes from previous assessment. Will continue to monitor.

## 2015-03-19 LAB — COMPREHENSIVE METABOLIC PANEL
ALT: 12 U/L — ABNORMAL LOW (ref 17–63)
AST: 13 U/L — ABNORMAL LOW (ref 15–41)
Albumin: 2.6 g/dL — ABNORMAL LOW (ref 3.5–5.0)
Alkaline Phosphatase: 47 U/L (ref 38–126)
Anion gap: 11 (ref 5–15)
BUN: 50 mg/dL — ABNORMAL HIGH (ref 6–20)
CO2: 24 mmol/L (ref 22–32)
Calcium: 9 mg/dL (ref 8.9–10.3)
Chloride: 109 mmol/L (ref 101–111)
Creatinine, Ser: 3.13 mg/dL — ABNORMAL HIGH (ref 0.61–1.24)
GFR calc Af Amer: 21 mL/min — ABNORMAL LOW (ref >60)
GFR calc non Af Amer: 18 mL/min — ABNORMAL LOW (ref >60)
Glucose, Bld: 96 mg/dL (ref 65–99)
Potassium: 4.4 mmol/L (ref 3.5–5.1)
Sodium: 144 mmol/L (ref 135–145)
Total Bilirubin: 0.5 mg/dL (ref 0.3–1.2)
Total Protein: 5 g/dL — ABNORMAL LOW (ref 6.5–8.1)

## 2015-03-19 LAB — GLUCOSE, CAPILLARY
Glucose-Capillary: 115 mg/dL — ABNORMAL HIGH (ref 65–99)
Glucose-Capillary: 109 mg/dL — ABNORMAL HIGH (ref 65–99)
Glucose-Capillary: 106 mg/dL — ABNORMAL HIGH (ref 65–99)
Glucose-Capillary: 130 mg/dL — ABNORMAL HIGH (ref 65–99)

## 2015-03-19 LAB — CBC
HCT: 25.9 % — ABNORMAL LOW (ref 39.0–52.0)
Hemoglobin: 8.1 g/dL — ABNORMAL LOW (ref 13.0–17.0)
MCH: 30.1 pg (ref 26.0–34.0)
MCHC: 31.3 g/dL (ref 30.0–36.0)
MCV: 96.3 fL (ref 78.0–100.0)
Platelets: 398 10*3/uL (ref 150–400)
RBC: 2.69 MIL/uL — ABNORMAL LOW (ref 4.22–5.81)
RDW: 13.8 % (ref 11.5–15.5)
WBC: 10.1 10*3/uL (ref 4.0–10.5)

## 2015-03-19 MED ORDER — ENOXAPARIN SODIUM 30 MG/0.3ML ~~LOC~~ SOLN
30.0000 mg | SUBCUTANEOUS | Status: DC
  Administered 2015-03-20 – 2015-03-25 (×6): 30 mg via SUBCUTANEOUS
  Filled 2015-03-19 (×6): qty 0.3

## 2015-03-19 MED ORDER — SODIUM CHLORIDE 0.9 % IV SOLN
100.0000 mg | Freq: Once | INTRAVENOUS | Status: AC
  Administered 2015-03-19: 100 mg via INTRAVENOUS
  Filled 2015-03-19 (×2): qty 2

## 2015-03-19 MED ORDER — MIDODRINE HCL 5 MG PO TABS
10.0000 mg | ORAL_TABLET | Freq: Three times a day (TID) | ORAL | Status: DC
  Administered 2015-03-19 – 2015-03-25 (×17): 10 mg via ORAL
  Filled 2015-03-19 (×20): qty 2

## 2015-03-19 MED ORDER — SODIUM CHLORIDE 0.9 % IV SOLN
25.0000 mg | Freq: Once | INTRAVENOUS | Status: AC
  Administered 2015-03-19: 25 mg via INTRAVENOUS
  Filled 2015-03-19: qty 0.5

## 2015-03-19 NOTE — Progress Notes (Signed)
Physical Therapy Treatment Patient Details Name: [name] [name] MRN: [id number] DOB: [birth year] Today's Date: 03/19/2015    History of Present Illness Patient is a 75 y/o male with hx of CAD, HLD, CKD, HTN, DM and gout presents s/p CABG x4.    PT Comments    Pt performed increased gait distance and stair training.  Pt performed gait training without assistive device and tolerated increased therapeutic exercise, post ambulation.  Pt performed with HR 111-120 bpm.  Unable to obtain pulse oximeter reading.    Follow Up Recommendations  SNF;Supervision for mobility/OOB     Equipment Recommendations  Rolling walker with 5" wheels    Recommendations for Other Services       Precautions / Restrictions Precautions Precautions: Sternal;Fall Precaution Comments: Pt required cues for recall of sternal precautions, pt able to maintain during tx.   Restrictions Weight Bearing Restrictions: No    Mobility  Bed Mobility Overal bed mobility:  (Pt received sitting in straight chair with arm rests on arrival..)                Transfers Overall transfer level: Needs assistance Equipment used: None Transfers: Sit to/from Stand Sit to Stand: Min guard         General transfer comment: Cues for sequencing and use of heart pillow to maintain sternal precautions.    Ambulation/Gait Ambulation/Gait assistance: Min guard;Supervision Ambulation Distance (Feet): 320 Feet Assistive device: None Gait Pattern/deviations: Step-through pattern;Decreased stride length;Drifts right/left Gait velocity: decreased   General Gait Details: SHOB noted requiring standing rest breaks, unable to obtain reading on pulse oximeter.  Pt required 2 rest periods, pre and post stair training.  Remains to require cues for pursed lip breathing.     Stairs Stairs: Yes Stairs assistance: Min guard Stair Management: One rail Right Number of Stairs: 6 General stair comments: Cues to maintain non-reciprocal  pattern to improve safety during negotiation.  Pt required cues and demonstration to correct technique.    Wheelchair Mobility    Modified Rankin (Stroke Patients Only)       Balance Overall balance assessment: Needs assistance   Sitting balance-Leahy Scale: Good       Standing balance-Leahy Scale: Fair Standing balance comment: Pt remains able to stand unsupported statically.                      Cognition Arousal/Alertness: Awake/alert Behavior During Therapy: WFL for tasks assessed/performed Overall Cognitive Status: Within Functional Limits for tasks assessed                      Exercises General Exercises - Lower Extremity Heel Raises: AROM;Standing;Both;10 reps Other Exercises Other Exercises: Sit to stand x10 from low chair for strengthening.  Pt performed postural/upper trunk exercises to improve posturing: 1x10: chin tucks, cervical flexion/extension, cervical rotation, shoudler elevation, dorsal extension, scapular retraction and  backwards shoulder circles.  Cues for technique to avoid extraneous movements.      General Comments        Pertinent Vitals/Pain Pain Assessment: No/denies pain    Home Living                      Prior Function            PT Goals (current goals can now be found in the care plan section) Acute Rehab PT Goals Patient Stated Goal: to return to independence Potential to Achieve Goals: Good Progress towards PT goals: Progressing  toward goals    Frequency  Min 3X/week    PT Plan Current plan remains appropriate    Co-evaluation             End of Session Equipment Utilized During Treatment: Gait belt Activity Tolerance: Patient tolerated treatment well Patient left: in chair;with call bell/phone within reach     Time: 0858-0924 PT Time Calculation (min) (ACUTE ONLY): 26 min  Charges:  $Gait Training: 8-22 mins $Therapeutic Exercise: 8-22 mins                    G Codes:      Cristela Blue 2015-03-27, 9:42 AM Governor Rooks, PTA pager [phone]

## 2015-03-19 NOTE — Progress Notes (Signed)
Utilization review completed.  

## 2015-03-19 NOTE — Progress Notes (Signed)
EPWs removed per order and unit protocol.  All tips intact, Pt tolerated very well.  Sites painted, nothing remarkable.  Pt understands bedrest for one hour.  VSS, see flowsheet.  Call bell and all other requested items in easy reach.  CCMD notified, will monitor closely.

## 2015-03-19 NOTE — Progress Notes (Signed)
CARDIAC REHAB PHASE I   PRE:  Rate/Rhythm: 80 SR    BP: sitting 94/65    SaO2: 95 RA  MODE:  Ambulation: 350 ft   POST:  Rate/Rhythm: 112 ST    BP: sitting 11655     SaO2: 97 RA  Pt able to stand independently and walk. Slightly weak but only min assist needed. Pt SOB with walking, HR up to 112 ST. SaO2 97 RA after walking. Took one rest stop (I asked him to). Very pleasant and moving well. Inspiring IS to 2250 mL. To recliner with feet elevated. Encouraged x1 more walk.  Chauncey, ACSM 03/19/2015 2:37 PM

## 2015-03-19 NOTE — Progress Notes (Addendum)
      [address]       [place] [address]             [phone]        9 Days Post-Op Procedure(s) (LRB): CORONARY ARTERY BYPASS GRAFTING (CABG) x four  , using left internal mammary artery and bilateral greater saphenous veins harvested endoscopically (N/A) TRANSESOPHAGEAL ECHOCARDIOGRAM (TEE) (N/A)  Subjective: Patient sitting in chair. He said he slept well last night. He has no complaints this am  Objective: Vital signs in last 24 hours: Temp:  [97.5 F (36.4 C)-98.2 F (36.8 C)] 98.1 F (36.7 C) (03/16 0400) Pulse Rate:  [71-90] 73 (03/16 0400) Cardiac Rhythm:  [-] Normal sinus rhythm (03/15 1900) Resp:  [18-23] 18 (03/16 0400) BP: (98-121)/(54-70) 112/64 mmHg (03/16 0400) SpO2:  [94 %-100 %] 98 % (03/16 0400) Weight:  [211 lb 4.8 oz (95.845 kg)] 211 lb 4.8 oz (95.845 kg) (03/16 0417)  Pre op weight 95.7 kg Current Weight  03/19/15 211 lb 4.8 oz (95.845 kg)      Intake/Output from previous day: 03/15 0701 - 03/16 0700 In: 928.8 [P.O.:720; I.V.:208.8] Out: 2000 [Urine:2000]   Physical Exam:  Cardiovascular: RRR Pulmonary: Clear to auscultation bilaterally; no rales, wheezes, or rhonchi. Abdomen: Soft, non tender, bowel sounds present. Extremities: Mild bilateral lower extremity edema. Wounds: Clean and dry.  No erythema or signs of infection.  Lab Results: CBC: Recent Labs  03/18/15 0642 03/19/15 0455  WBC 8.3 10.1  HGB 8.4* 8.1*  HCT 25.7* 25.9*  PLT 353 398   BMET:  Recent Labs  03/18/15 0642 03/19/15 0455  NA 143 144  K 4.4 4.4  CL 112* 109  CO2 23 24  GLUCOSE 117* 96  BUN 48* 50*  CREATININE 2.72* 3.13*  CALCIUM 8.9 9.0    PT/INR:  Lab Results  Component Value Date   INR 1.48 03/10/2015   INR 1.06 03/09/2015   INR 1.07 03/02/2015   ABG:  INR: Will add last result for INR, ABG once components are confirmed Will add last 4 CBG results once components are confirmed  Assessment/Plan:  1. CV - Previous a  fib. SR in the 90's this am. On  Amiodarone 200 mg bid, Lopressor 12.5 mg bid. 2.  Pulmonary - On room air. 3. Volume Overload - On Lasix 40 mg IV bid 4.  Acute blood loss anemia - H and H stable at 8.1 and 25.9. Continue Trinsicon 5. 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 On Dopamine drip. Nephology following. 6. DM-CBGs 93/127/115. On Insulin. Pre op HGA1C 7.3. 7. Remove EPW 8. Will need SNF (in [place]) when ready for discharge  ZIMMERMAN,DONIELLE MPA-C 03/19/2015,8:26 AM  Wean off dopamine inam and follow creat Reduce lasix to one dose daily - renal signed off [facility] for SNF Monday or when creat stable < 3.0 patient examined and medical record reviewed,agree with above note. [name] 03/19/2015

## 2015-03-20 LAB — COMPREHENSIVE METABOLIC PANEL
ALT: 11 U/L — ABNORMAL LOW (ref 17–63)
AST: 12 U/L — ABNORMAL LOW (ref 15–41)
Albumin: 2.8 g/dL — ABNORMAL LOW (ref 3.5–5.0)
Alkaline Phosphatase: 52 U/L (ref 38–126)
Anion gap: 9 (ref 5–15)
BUN: 55 mg/dL — ABNORMAL HIGH (ref 6–20)
CO2: 24 mmol/L (ref 22–32)
Calcium: 9.1 mg/dL (ref 8.9–10.3)
Chloride: 109 mmol/L (ref 101–111)
Creatinine, Ser: 3.51 mg/dL — ABNORMAL HIGH (ref 0.61–1.24)
GFR calc Af Amer: 18 mL/min — ABNORMAL LOW (ref >60)
GFR calc non Af Amer: 16 mL/min — ABNORMAL LOW (ref >60)
Glucose, Bld: 111 mg/dL — ABNORMAL HIGH (ref 65–99)
Potassium: 4.4 mmol/L (ref 3.5–5.1)
Sodium: 142 mmol/L (ref 135–145)
Total Bilirubin: 0.4 mg/dL (ref 0.3–1.2)
Total Protein: 5.1 g/dL — ABNORMAL LOW (ref 6.5–8.1)

## 2015-03-20 LAB — CBC
HCT: 26.3 % — ABNORMAL LOW (ref 39.0–52.0)
Hemoglobin: 8.4 g/dL — ABNORMAL LOW (ref 13.0–17.0)
MCH: 30.8 pg (ref 26.0–34.0)
MCHC: 31.9 g/dL (ref 30.0–36.0)
MCV: 96.3 fL (ref 78.0–100.0)
Platelets: 445 10*3/uL — ABNORMAL HIGH (ref 150–400)
RBC: 2.73 MIL/uL — ABNORMAL LOW (ref 4.22–5.81)
RDW: 14 % (ref 11.5–15.5)
WBC: 10 10*3/uL (ref 4.0–10.5)

## 2015-03-20 LAB — GLUCOSE, CAPILLARY
Glucose-Capillary: 124 mg/dL — ABNORMAL HIGH (ref 65–99)
Glucose-Capillary: 118 mg/dL — ABNORMAL HIGH (ref 65–99)
Glucose-Capillary: 131 mg/dL — ABNORMAL HIGH (ref 65–99)
Glucose-Capillary: 99 mg/dL (ref 65–99)

## 2015-03-20 MED ORDER — SODIUM CHLORIDE 0.9% FLUSH
10.0000 mL | INTRAVENOUS | Status: DC | PRN
  Administered 2015-03-20 – 2015-03-24 (×6): 10 mL
  Filled 2015-03-20 (×5): qty 40

## 2015-03-20 MED ORDER — SODIUM CHLORIDE 0.9% FLUSH
10.0000 mL | Freq: Two times a day (BID) | INTRAVENOUS | Status: DC
  Administered 2015-03-24: 10 mL

## 2015-03-20 NOTE — Discharge Summary (Signed)
Physician Discharge Summary  Patient ID: CINCERE GIVAN MRN: [id number] DOB/AGE: [birth year] 75 y.o.  Admit date: 03/10/2015 Discharge date: 03/25/2015  Admission Diagnoses:  Patient Active Problem List   Diagnosis Date Noted  . Carotid stenosis- high grade Rt 03/03/2015  . CAD (coronary artery disease) 03/02/2015  . Abnormal nuclear stress test   . Coronary artery disease involving native coronary artery of native heart without angina pectoris- plans for CABG   . Essential hypertension, benign 05/21/2009  . RENAL FAILURE, CHRONIC 11/20/2008  . Pure hypercholesterolemia 11/19/2008  . CORONARY ATHEROSCLEROSIS NATIVE CORONARY ARTERY 11/19/2008  . SHORTNESS OF BREATH 11/19/2008   Discharge Diagnoses:   Patient Active Problem List   Diagnosis Date Noted  . 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 Carotid stenosis- high grade Rt 03/03/2015  . CAD (coronary artery disease) 03/02/2015  . Abnormal nuclear stress test   . Coronary artery disease involving native coronary artery of native heart without angina pectoris- plans for CABG   . Essential hypertension, benign 05/21/2009  . RENAL FAILURE, CHRONIC 11/20/2008  . Pure hypercholesterolemia 11/19/2008  . CORONARY ATHEROSCLEROSIS NATIVE CORONARY ARTERY 11/19/2008  . SHORTNESS OF BREATH 11/19/2008   Discharged Condition: good  History of Present Illness:  Mr. [name] [name] is a 75 yo white male with known history of chronic renal insufficiency.  The patient also has a known history of Carotid stenosis and was undergoing cardiac clearance for a carotid endarterectomy.  Stress test was found to be high risk.  Due to this it was recommended the patient undergo cardiac catheterization.  This was done and showed severe left main disease with a preserved EF.  It was felt coronary bypass grafting would be indicated and TCTS consult was obtained.  Dr. [name] evaluated the patient at which time he denied symptoms of angina.  It was felt the patient would benefit from  CABG procedure.  The risks and benefits of the procedure were explained to the patient and he was agreeable to proceed.  He had been taking Plavix, which would require several days to wash out prior to proceeding with surgery.    Hospital Course:   He was taken to the operating room on 03/10/2015.  He underwent CABG x 4 utilizing LIMA to LAD, SVG to Diagonal, SVG to OM, and SVG to distal circumflex.  He also underwent endoscopic harvest of greater saphenous vein from right and left leg.  He tolerated the procedure and was taken the SICU in stable condition.  He was extubated the evening of surgery.  During his stay in the SICU the patient developed Atrial Fibrillation and was subsequently treated with IV Amiodarone with successful conversion to NSR.  He was weaned off Dopamine and Neo as tolerated.  His creatinine slowly increased with a peak level of 3.69.  Nephrology followed the patient and their recommendations for diuresis were followed.  His chest tubes and arterial lines were removed without difficulty.  He was ambulating in the SICU and felt medically stable for transfer to the step down unit on POD #8.  The patient continues to make progress.  He continues to maintain NSR and his pacing wires have been removed.  He continues to have significant LE edema and will require Lasix at discharge.  His creatinine has trended down and is currently at 3.29.  He will require home health at discharge.  These arrangements have been made.  He continues to ambulate with assistance.  He is tolerating a heart healthy diet.  He  is felt medically stable for discharge home today.   Consults: nephrology  Significant Diagnostic Studies: angiography:    Ost RCA lesion, 30% stenosed.  Mid LAD lesion, 100% stenosed.  Ost LM lesion, 70% stenosed.  Ost LAD lesion, 50% stenosed.  Ost Ramus to Ramus lesion, 99% stenosed.  1st Mrg lesion, 30% stenosed.  3rd Mrg lesion, 70% stenosed.  Mid Cx-1 lesion, 70%  stenosed.  Mid Cx-2 lesion, 99% stenosed.  Ost 1st Diag to 1st Diag lesion, 99% stenosed.  Treatments: surgery:   1. Coronary artery bypass grafting x4 (left internal mammary artery to  left anterior descending coronary artery, saphenous vein graft to  diagonal, saphenous vein graft to obtuse marginal, saphenous vein  graft to distal circumflex posterolateral). 2. Endoscopic harvest of bilateral greater saphenous vein.  Disposition: SNF  Discharge Medications:  The patient has been discharged on:   1.Beta Blocker:  Yes [   ]                              No   [ x  ]                              If No, reason: orthostatic hypotension  2.Ace Inhibitor/ARB: Yes [   ]                                     No  [ x   ]                                     If No, reason: Renal Insufficiency  3.Statin:   Yes [ x  ]                  No  [   ]                  If No, reason:  4.Ecasa:  Yes  [ x  ]                  No   [   ]                  If No, reason:      Discharge Instructions    Amb Referral to Cardiac Rehabilitation    Complete by:  As directed   To [place]  Diagnosis:  CABG            Medication List    STOP taking these medications        amLODipine 5 MG tablet  Commonly known as:  NORVASC     isosorbide mononitrate 30 MG 24 hr tablet  Commonly known as:  IMDUR     losartan 25 MG tablet  Commonly known as:  COZAAR      TAKE these medications        acetaminophen 325 MG tablet  Commonly known as:  TYLENOL  Take 2 tablets (650 mg total) by mouth every 4 (four) hours as needed for headache or mild pain.     Acidophilus Caps capsule  Take 1 capsule by mouth daily as needed (for immune system).     amiodarone 200 MG tablet  Commonly known  as:  PACERONE  Take 1 tablet (200 mg total) by mouth 2 (two) times daily. For 7 Days, then decrease to 200 mg daily     aspirin 325 MG EC tablet  Take 1 tablet (325 mg total) by mouth daily.      clonazePAM 1 MG tablet  Commonly known as:  KLONOPIN  Take 1 mg by mouth daily.     clotrimazole-betamethasone cream  Commonly known as:  LOTRISONE  Apply 1 application topically as needed (for skin).     colchicine 0.6 MG tablet  Take 0.6 mg by mouth daily as needed (for gout).     ferrous [id number] C-folic acid capsule  Commonly known as:  TRINSICON / FOLTRIN  Take 1 capsule by mouth 3 (three) times daily after meals.     furosemide 40 MG tablet  Commonly known as:  LASIX  Take 1 tablet (40 mg total) by mouth daily.     glimepiride 2 MG tablet  Commonly known as:  AMARYL  Take 2 mg by mouth 2 (two) times daily.     lovastatin 20 MG tablet  Commonly known as:  MEVACOR  TAKE ONE TABLET BY MOUTH AT BEDTIME     METROGEL 1 % gel  Generic drug:  metroNIDAZOLE  Apply 1 application topically daily.     midodrine 10 MG tablet  Commonly known as:  PROAMATINE  Take 1 tablet (10 mg total) by mouth 3 (three) times daily with meals.     nitroGLYCERIN 0.4 MG SL tablet  Commonly known as:  NITROSTAT  Place 1 tablet (0.4 mg total) under the tongue every 5 (five) minutes as needed.     sodium polystyrene 15 GM/60ML suspension  Commonly known as:  KAYEXALATE  Take 30 g by mouth every 30 (thirty) days.     traMADol 50 MG tablet  Commonly known as:  ULTRAM  Take 1-2 tablets (50-100 mg total) by mouth every 4 (four) hours as needed for moderate pain.     Vitamin D3 2000 units Tabs  Take 2,000 Units by mouth daily.       Follow-up Information    Follow up with Ivin Poot III, MD On 04/22/2015.   Specialty:  Cardiothoracic Surgery   Why:  Appointment is at 1:30   Contact information:   [place] [address] [phone]       Follow up with [facility] On 04/22/2015.   Why:  Please get CXR at 1:00   Contact information:   [facility]       Follow up with Herminio Commons, MD On 04/08/2015.   Specialty:  Cardiology    Why:  Appointment is at 11:20   Contact information:   [place] [address] [phone]       Follow up with [facility].   Why:  HH-RN/PT/OT arranged- they will call you to arrange home visits   Contact information:   [phone]      Follow up with Nephrologist.   Why:  Please call and schedule follow up with Neprhologist for 1-2 weeks to monitor creatinine level      Signed: Reagan Klemz 03/25/2015, 1:44 PM

## 2015-03-20 NOTE — Clinical Documentation Improvement (Signed)
Cardiothoracic Please update your documentation within the medical record to reflect your response to this query. Thank you  Can the possible, probable or likely condition be further specified or clarified as noted by documentation?   Shock, including Type:  Septic, Cardiogenic, Hyper/Hypoglycemic, Hypovolemic, Hemorrhagic, Neurogenic, Anaphylactic, Other type, including suspected or known cause and/or associated condition(s)  Other  Clinically Undetermined  Document any associated diagnoses/conditions.  Supporting Information: 03/17/15 progr note on pod #7.Marland KitchenMarland Kitchen"Postop hypotension with normal cardiac output and acute on chronic renal insufficiency-Creatinine improving, urine output improving, blood pressure improving-Weaning off norepinephrine, continuing low-dose dopamine..." 03/18/15 Nephro Consult note..."62M with nonoliguric AoCKD3 following 4V CABG 3/7 and prolonged hypotension postoperative..."  Please exercise your independent, professional judgment when responding. A specific answer is not anticipated or expected.  Thank You,  Ermelinda Das, RN, BSN, [place] Certified Clinical Documentation Specialist [place]: Health Information Management [phone]

## 2015-03-20 NOTE — Progress Notes (Addendum)
      [address]       [place] [address]             [phone]      10 Days Post-Op Procedure(s) (LRB): CORONARY ARTERY BYPASS GRAFTING (CABG) x four  , using left internal mammary artery and bilateral greater saphenous veins harvested endoscopically (N/A) TRANSESOPHAGEAL ECHOCARDIOGRAM (TEE) (N/A)   Subjective:  [name] [name] has no complaints this morning.  He states he is doing pretty good.  + ambulation with assistance  Objective: Vital signs in last 24 hours: Temp:  [97.9 F (36.6 C)-98 F (36.7 C)] 97.9 F (36.6 C) (03/17 0500) Pulse Rate:  [70-111] 70 (03/16 1502) Cardiac Rhythm:  [-] Normal sinus rhythm (03/16 2000) Resp:  [18] 18 (03/16 1900) BP: (86-112)/(46-70) 112/70 mmHg (03/17 0500) SpO2:  [99 %] 99 % (03/16 1900) Weight:  [210 lb 11.2 oz (95.573 kg)] 210 lb 11.2 oz (95.573 kg) (03/17 0500)  General appearance: alert, cooperative and no distress Heart: regular rate and rhythm Lungs: clear to auscultation bilaterally Abdomen: soft, non-tender; bowel sounds normal; no masses,  no organomegaly Extremities: edema 2-3+ pitting Wound: clean and dry  Lab Results:  Recent Labs  03/19/15 0455 03/20/15 0500  WBC 10.1 10.0  HGB 8.1* 8.4*  HCT 25.9* 26.3*  PLT 398 445*   BMET:  Recent Labs  03/19/15 0455 03/20/15 0500  NA 144 142  K 4.4 4.4  CL 109 109  CO2 24 24  GLUCOSE 96 111*  BUN 50* 55*  CREATININE 3.13* 3.51*  CALCIUM 9.0 9.1    PT/INR: No results for input(s): LABPROT, INR in the last 72 hours. ABG    Component Value Date/Time   PHART 7.327* 03/11/2015 0030   HCO3 21.4 03/11/2015 0030   TCO2 21 03/12/2015 1552   ACIDBASEDEF 4.0* 03/11/2015 0030   O2SAT 70.8 03/17/2015 0504   CBG (last 3)   Recent Labs  03/19/15 1625 03/19/15 2124 03/20/15 0625  GLUCAP 106* 130* 124*    Assessment/Plan: S/P Procedure(s) (LRB): CORONARY ARTERY BYPASS GRAFTING (CABG) x four  , using left internal mammary artery and bilateral  greater saphenous veins harvested endoscopically (N/A) TRANSESOPHAGEAL ECHOCARDIOGRAM (TEE) (N/A)  1. CV- NSR, BP is labile at times, associated with orthostatic changes- continue Amiodarone, Lopressor, Midodrine 2. Pulm- no acute issues, continue IS 3. Renal- creatinine continues to trend down, currently at 3.5- will stop Dopamine, continue diuretics for hypervolemia 4. Expected post operative blood loss anemia- Hgb at 8.4 5. DM- cbgs remain controlled, continue current care 6. Dispo- patient stable, creatinine continues to trend down, will stop dopamine, continue diuresis.... If creatinine continues to improve we are hopeful to d/c patient to SNF Monday   LOS: 10 days    [name] [name] 03/20/2015   Chart reviewed, patient examined. His creatinine is actually increasing over the past three days up to 3.51 today. His baseline is about 1.8. Dopamine stopped today. He was started on midodrine for hypotension but BP still on the low normal side. I would stop his lopressor while needing midodrine. I would also stop his lasix for now while BP is low and creat is rising. He needs to settle out.

## 2015-03-20 NOTE — Progress Notes (Signed)
CARDIAC REHAB PHASE I   PRE:  Rate/Rhythm: 70 SR    BP: sitting 124/63    SaO2: 100 RA  MODE:  Ambulation: 550 ft   POST:  Rate/Rhythm: 116 ST    BP: sitting 155/64 (145/68 other arm)     SaO2: 100 RA  Pt progressing well. Steady walking with min assist.  HR slowly increases with distance. Pt SOB accompanying HR. BP much higher today. Feels well, to recliner. Eager to walk more. 1020-1100  [name] [name], ACSM 03/20/2015 11:04 AM

## 2015-03-20 NOTE — Care Management Important Message (Signed)
Important Message  Patient Details  Name: [name] [name] MRN: [id number] Date of Birth: Jul 19, 1940   Medicare Important Message Given:  Yes    [name] [name] 03/20/2015, 2:29 PM

## 2015-03-20 NOTE — Progress Notes (Signed)
Pt ambulated approx 400 ft without RW.  Gait a bit waddley, but steady.  Endorsed some mild SOB but did not need a rest.  HR noted to be 118 when we first arrived back to room but returned quickly to 90's after sitting.  Pt very motivated.  Independent in room.  Will con't plan of care.

## 2015-03-20 NOTE — Discharge Instructions (Signed)
Coronary Artery Bypass Grafting, Care After °Refer to this sheet in the next few weeks. These instructions provide you with information on caring for yourself after your procedure. Your health care provider may also give you more specific instructions. Your treatment has been planned according to current medical practices, but problems sometimes occur. Call your health care provider if you have any problems or questions after your procedure. °WHAT TO EXPECT AFTER THE PROCEDURE °Recovery from surgery will be different for everyone. Some people feel well after 3 or 4 weeks, while for others it takes longer. After your procedure, it is typical to have the following: °· Nausea and a lack of appetite.   °· Constipation. °· Weakness and fatigue.   °· Depression or irritability.   °· Pain or discomfort at your incision site. °HOME CARE INSTRUCTIONS °· Take medicines only as directed by your health care provider. Do not stop taking medicines or start any new medicines without first checking with your health care provider. °· Take your pulse as directed by your health care provider. °· Perform deep breathing as directed by your health care provider. If you were given a device called an incentive spirometer, use it to practice deep breathing several times a day. Support your chest with a pillow or your arms when you take deep breaths or cough. °· Keep incision areas clean, dry, and protected. Remove or change any bandages (dressings) only as directed by your health care provider. You may have skin adhesive strips over the incision areas. Do not take the strips off. They will fall off on their own. °· Check incision areas daily for any swelling, redness, or drainage. °· If incisions were made in your legs, do the following: °¨ Avoid crossing your legs.   °¨ Avoid sitting for long periods of time. Change positions every 30 minutes.   °¨ Elevate your legs when you are sitting. °· Wear compression stockings as directed by your  health care provider. These stockings help keep blood clots from forming in your legs. °· Take showers once your health care provider approves. Until then, only take sponge baths. Pat incisions dry. Do not rub incisions with a washcloth or towel. Do not take baths, swim, or use a hot tub until your health care provider approves. °· Eat foods that are high in fiber, such as raw fruits and vegetables, whole grains, beans, and nuts. Meats should be lean cut. Avoid canned, processed, and fried foods. °· Drink enough fluid to keep your urine clear or pale yellow. °· Weigh yourself every day. This helps identify if you are retaining fluid that may make your heart and lungs work harder. °· Rest and limit activity as directed by your health care provider. You may be instructed to: °¨ Stop any activity at once if you have chest pain, shortness of breath, irregular heartbeats, or dizziness. Get help right away if you have any of these symptoms. °¨ Move around frequently for short periods or take short walks as directed by your health care provider. Increase your activities gradually. You may need physical therapy or cardiac rehabilitation to help strengthen your muscles and build your endurance. °¨ Avoid lifting, pushing, or pulling anything heavier than 10 lb (4.5 kg) for at least 6 weeks after surgery. °· Do not drive until your health care provider approves.  °· Ask your health care provider when you may return to work. °· Ask your health care provider when you may resume sexual activity. °· Keep all follow-up visits as directed by your health care   provider. This is important. °SEEK MEDICAL CARE IF: °· You have swelling, redness, increasing pain, or drainage at the site of an incision. °· You have a fever. °· You have swelling in your ankles or legs. °· You have pain in your legs.   °· You gain 2 or more pounds (0.9 kg) a day. °· You are nauseous or vomit. °· You have diarrhea.  °SEEK IMMEDIATE MEDICAL CARE IF: °· You have  chest pain that goes to your jaw or arms. °· You have shortness of breath.   °· You have a fast or irregular heartbeat.   °· You notice a "clicking" in your breastbone (sternum) when you move.   °· You have numbness or weakness in your arms or legs. °· You feel dizzy or light-headed.   °MAKE SURE YOU: °· Understand these instructions. °· Will watch your condition. °· Will get help right away if you are not doing well or get worse. °  °This information is not intended to replace advice given to you by your health care provider. Make sure you discuss any questions you have with your health care provider. °  °Document Released: 07/09/2004 Document Revised: 01/10/2014 Document Reviewed: 05/29/2012 °Elsevier Interactive Patient Education ©2016 [employer] ° °Endoscopic Saphenous Vein Harvesting, Care After °Refer to this sheet in the next few weeks. These instructions provide you with information on caring for yourself after your procedure. Your health care provider may also give you more specific instructions. Your treatment has been planned according to current medical practices, but problems sometimes occur. Call your health care provider if you have any problems or questions after your procedure. °HOME CARE INSTRUCTIONS °Medicine °· Take whatever pain medicine your surgeon prescribes. Follow the directions carefully. Do not take over-the-counter pain medicine unless your surgeon says it is okay. Some pain medicine can cause bleeding problems for several weeks after surgery. °· Follow your surgeon's instructions about driving. You will probably not be permitted to drive after heart surgery. °· Take any medicines your surgeon prescribes. Any medicines you took before your heart surgery should be checked with your health care provider before you start taking them again. °Wound care °· If your surgeon has prescribed an elastic bandage or stocking, ask how long you should wear it. °· Check the area around your surgical  cuts (incisions) whenever your bandages (dressings) are changed. Look for any redness or swelling. °· You will need to return to have the stitches (sutures) or staples taken out. Ask your surgeon when to do that. °· Ask your surgeon when you can shower or bathe. °Activity °· Try to keep your legs raised when you are sitting. °· Do any exercises your health care providers have given you. These may include deep breathing exercises, coughing, walking, or other exercises. °SEEK MEDICAL CARE IF: °· You have any questions about your medicines. °· You have more leg pain, especially if your pain medicine stops working. °· New or growing bruises develop on your leg. °· Your leg swells, feels tight, or becomes red. °· You have numbness in your leg. °SEEK IMMEDIATE MEDICAL CARE IF: °· Your pain gets much worse. °· Blood or fluid leaks from any of the incisions. °· Your incisions become warm, swollen, or red. °· You have chest pain. °· You have trouble breathing. °· You have a fever. °· You have more pain near your leg incision. °MAKE SURE YOU: °· Understand these instructions. °· Will watch your condition. °· Will get help right away if you are not doing well or   get worse. °  °This information is not intended to replace advice given to you by your health care provider. Make sure you discuss any questions you have with your health care provider. °  °Document Released: 09/01/2010 Document Revised: 01/10/2014 Document Reviewed: 09/01/2010 °Elsevier Interactive Patient Education ©2016 [employer] ° ° °

## 2015-03-21 LAB — BASIC METABOLIC PANEL
ANION GAP: 12 (ref 5–15)
BUN: 61 mg/dL — ABNORMAL HIGH (ref 6–20)
CO2: 22 mmol/L (ref 22–32)
Calcium: 9.1 mg/dL (ref 8.9–10.3)
Chloride: 110 mmol/L (ref 101–111)
Creatinine, Ser: 3.69 mg/dL — ABNORMAL HIGH (ref 0.61–1.24)
GFR calc Af Amer: 17 mL/min — ABNORMAL LOW (ref >60)
GFR, EST NON AFRICAN AMERICAN: 15 mL/min — AB (ref >60)
Glucose, Bld: 114 mg/dL — ABNORMAL HIGH (ref 65–99)
POTASSIUM: 4.2 mmol/L (ref 3.5–5.1)
SODIUM: 144 mmol/L (ref 135–145)

## 2015-03-21 LAB — GLUCOSE, CAPILLARY
Glucose-Capillary: 113 mg/dL — ABNORMAL HIGH (ref 65–99)
Glucose-Capillary: 124 mg/dL — ABNORMAL HIGH (ref 65–99)
Glucose-Capillary: 129 mg/dL — ABNORMAL HIGH (ref 65–99)
Glucose-Capillary: 113 mg/dL — ABNORMAL HIGH (ref 65–99)

## 2015-03-21 NOTE — Progress Notes (Addendum)
      [address]       [place] [address]             [phone]      11 Days Post-Op Procedure(s) (LRB): CORONARY ARTERY BYPASS GRAFTING (CABG) x four  , using left internal mammary artery and bilateral greater saphenous veins harvested endoscopically (N/A) TRANSESOPHAGEAL ECHOCARDIOGRAM (TEE) (N/A)   Subjective:  [name] [name] has no complaints this morning.  He states he feels his legs feel less swollen.    Objective: Vital signs in last 24 hours: Temp:  [97.4 F (36.3 C)-98.3 F (36.8 C)] 98.3 F (36.8 C) (03/18 0354) Pulse Rate:  [75-89] 75 (03/18 0354) Cardiac Rhythm:  [-] Normal sinus rhythm (03/18 0700) Resp:  [18] 18 (03/18 0354) BP: (93-113)/(47-57) 113/47 mmHg (03/18 0354) SpO2:  [95 %-99 %] 95 % (03/18 0354) Weight:  [209 lb 14.1 oz (95.2 kg)] 209 lb 14.1 oz (95.2 kg) (03/18 0354)  Intake/Output from previous day: 03/17 0701 - 03/18 0700 In: 320 [P.O.:300; I.V.:20] Out: -   General appearance: alert, cooperative and no distress Heart: regular rate and rhythm Lungs: clear to auscultation bilaterally Abdomen: soft, non-tender; bowel sounds normal; no masses,  no organomegaly Extremities: edema 2+ pitting Wound: clean and dry  Lab Results:  Recent Labs  03/19/15 0455 03/20/15 0500  WBC 10.1 10.0  HGB 8.1* 8.4*  HCT 25.9* 26.3*  PLT 398 445*   BMET:  Recent Labs  03/20/15 0500 03/21/15 0454  NA 142 144  K 4.4 4.2  CL 109 110  CO2 24 22  GLUCOSE 111* 114*  BUN 55* 61*  CREATININE 3.51* 3.69*  CALCIUM 9.1 9.1    PT/INR: No results for input(s): LABPROT, INR in the last 72 hours. ABG    Component Value Date/Time   PHART 7.327* 03/11/2015 0030   HCO3 21.4 03/11/2015 0030   TCO2 21 03/12/2015 1552   ACIDBASEDEF 4.0* 03/11/2015 0030   O2SAT 70.8 03/17/2015 0504   CBG (last 3)   Recent Labs  03/20/15 1630 03/20/15 2108 03/21/15 0615  GLUCAP 131* 99 113*    Assessment/Plan: S/P Procedure(s) (LRB): CORONARY ARTERY  BYPASS GRAFTING (CABG) x four  , using left internal mammary artery and bilateral greater saphenous veins harvested endoscopically (N/A) TRANSESOPHAGEAL ECHOCARDIOGRAM (TEE) (N/A)  1. CV- NSR, BP remains labile- will continue Midodrine and Amiodarone  2. Pulm- no acute issues, continue IS 3. Renal- creatinine continues to elevate, currently up to 3.69- diuretics d/c yesterday, ?restart Dopamine? 4. DM- cbgs controlled continue current care 5. Dispo- patient stable, creatinine slowly rising currently up to 3.69, diuretics stopped yesterday, will repeat BMET in AM   LOS: 11 days    BARRETT, [place] 03/21/2015  I have seen and examined the patient and agree with the assessment and plan as outlined.  I don't know of any roll for dopamine in this setting.  Will ask Nephrology to reevaluate.  Hold diuretics for now.  Rexene Alberts, MD 03/21/2015 8:01 PM

## 2015-03-21 NOTE — Progress Notes (Signed)
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  Ambulatory Still with some LEE, compression s tockings on No NSAIDs, recent ABX, Contrast    03/17 0701 - 03/18 0700 In: 320 [P.O.:300; I.V.:20] Out: -   Filed Weights   03/19/15 0417 03/20/15 0500 03/21/15 0354  Weight: 95.845 kg (211 lb 4.8 oz) 95.573 kg (210 lb 11.2 oz) 95.2 kg (209 lb 14.1 oz)    Scheduled Meds: . amiodarone  200 mg Oral BID  . antiseptic oral rinse  7 mL Mouth Rinse BID  . aspirin EC  325 mg Oral Daily   Or  . aspirin  324 mg Per Tube Daily  . bisacodyl  10 mg Oral Daily   Or  . bisacodyl  10 mg Rectal Daily  . cholecalciferol  2,000 Units Oral Daily  . clonazePAM  1 mg Oral Daily  . docusate sodium  200 mg Oral Daily  . enoxaparin (LOVENOX) injection  30 mg Subcutaneous Q24H  . ferrous Q000111Q C-folic acid  1 capsule Oral TID PC  . insulin aspart  0-24 Units Subcutaneous TID AC & HS  . insulin detemir  12 Units Subcutaneous BID  . midodrine  10 mg Oral TID WC  . pantoprazole  40 mg Oral Daily  . pneumococcal 23 valent vaccine  0.5 mL Intramuscular Once  . pravastatin  20 mg Oral q1800  . sodium chloride flush  10-40 mL Intracatheter Q12H  . sodium chloride flush  3 mL Intravenous Q12H   Continuous Infusions: . sodium chloride     PRN Meds:.clotrimazole-betamethasone, colchicine, ondansetron (ZOFRAN) IV, oxyCODONE, sodium chloride flush, sodium chloride flush, traMADol  Current Labs: reviewed    Physical Exam:  Blood pressure 101/55, pulse 77, temperature 98.3 F (36.8 C), temperature source Oral, resp. rate 18, height 5\' 11"  (1.803  m), weight 95.2 kg (209 lb 14.1 oz), SpO2 95 %. GEN: NAD ENT: NCAT EYES: EOMI. Glasses on CV: RRR PULM: CTAB ABD: s/nt/nd; no SP tenderness/fullness SKIN: no rashes/lesions, sternotomy cdi [id number] LEE  3/14 Renal US: R 8.8 with atrophy no HN; L 12.5 with atrophy no HN 3/13 UP/C 0.42 3/13 UA neg protein, blood, LE/Nit  A 1. AoCKD3, nonoliguric, no obvious nephrotoxins; BL SCr 2.6 sees [name] 1. 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 Repeat acute insult here likely related to overdiuresis; agree with held furosemide for now 3. Need to exclude BOO, will have RN do PVR at bedside 2. 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 B/l LEE, stable 4. Hypoalbuminemia 5. 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 Hypotension on dopamin and NE 7. HLD 8. Gout 9. Tobacco user 10. Anemia, stable  Plan 1. Cont to hold diuretics 2. Elevate legs / compression hose for edema as being done 3. Bladder scan post void to see if any significant residuals 4. Cont daily weights, Daily Renal Panel, Strict I/Os, Avoid nephrotoxins (NSAIDs, judicious IV Contrast)    Pearson Grippe MD 03/21/2015, 10:43 AM   Recent Labs Lab 03/19/15 0455 03/20/15 0500 03/21/15 0454  NA 144 142 144  K 4.4 4.4  4.2  CL 109 109 110  CO2 24 24 22   GLUCOSE 96 111* 114*  BUN 50* 55* 61*  CREATININE 3.13* 3.51* 3.69*  CALCIUM 9.0 9.1 9.1    Recent Labs Lab 03/18/15 0642 03/19/15 0455 03/20/15 0500  WBC 8.3 10.1 10.0  HGB 8.4* 8.1* 8.4*  HCT 25.7* 25.9* 26.3*  MCV 95.5 96.3 96.3  PLT 353 398 445*

## 2015-03-21 NOTE — Progress Notes (Signed)
Pt had 0 mL post void residual.

## 2015-03-21 NOTE — Progress Notes (Signed)
CARDIAC REHAB PHASE I   PRE:  Rate/Rhythm: 87 SR  BP:  Supine:  Sitting: 118/58  Standing:    SaO2: 98 RA  MODE:  Ambulation: 550 ft   POST:  Rate/Rhythm: 117 ST  BP:  Supine:   Sitting: 158/59  Standing:    SaO2: 99 RA [id number] Assisted X 1 to ambulate. Gait steady. Pt walked 550 feet without c/o. VS stable Pt to recliner after walk with call light in reach.  Rodney Langton RN 03/21/2015 1:09 PM

## 2015-03-22 LAB — GLUCOSE, CAPILLARY
Glucose-Capillary: 102 mg/dL — ABNORMAL HIGH (ref 65–99)
Glucose-Capillary: 123 mg/dL — ABNORMAL HIGH (ref 65–99)
Glucose-Capillary: 106 mg/dL — ABNORMAL HIGH (ref 65–99)
Glucose-Capillary: 135 mg/dL — ABNORMAL HIGH (ref 65–99)

## 2015-03-22 LAB — BASIC METABOLIC PANEL
Anion gap: 13 (ref 5–15)
BUN: 58 mg/dL — AB (ref 6–20)
CHLORIDE: 110 mmol/L (ref 101–111)
CO2: 22 mmol/L (ref 22–32)
Calcium: 9.2 mg/dL (ref 8.9–10.3)
Creatinine, Ser: 3.49 mg/dL — ABNORMAL HIGH (ref 0.61–1.24)
GFR calc Af Amer: 18 mL/min — ABNORMAL LOW (ref >60)
GFR calc non Af Amer: 16 mL/min — ABNORMAL LOW (ref >60)
GLUCOSE: 105 mg/dL — AB (ref 65–99)
POTASSIUM: 4.2 mmol/L (ref 3.5–5.1)
Sodium: 145 mmol/L (ref 135–145)

## 2015-03-22 NOTE — Progress Notes (Signed)
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 amiodarone  200 mg Oral BID  . antiseptic oral rinse  7 mL Mouth Rinse BID  . aspirin EC  325 mg Oral Daily   Or  . aspirin  324 mg Per Tube Daily  . bisacodyl  10 mg Oral Daily   Or  . bisacodyl  10 mg Rectal Daily  . cholecalciferol  2,000 Units Oral Daily  . clonazePAM  1 mg Oral Daily  . docusate sodium  200 mg Oral Daily  . enoxaparin (LOVENOX) injection  30 mg Subcutaneous Q24H  . ferrous Q000111Q C-folic acid  1 capsule Oral TID PC  . insulin aspart  0-24 Units Subcutaneous TID AC & HS  . insulin detemir  12 Units Subcutaneous BID  . midodrine  10 mg Oral TID WC  . pantoprazole  40 mg Oral Daily  . pneumococcal 23 valent vaccine  0.5 mL Intramuscular Once  . pravastatin  20 mg Oral q1800  . sodium chloride flush  10-40 mL Intracatheter Q12H  . sodium chloride flush  3 mL Intravenous Q12H   Continuous Infusions: . sodium chloride     PRN Meds:.clotrimazole-betamethasone, colchicine, ondansetron (ZOFRAN) IV, oxyCODONE, sodium chloride flush, sodium chloride flush, traMADol  Current Labs: reviewed    Physical Exam:  Blood pressure 114/60, pulse 80, temperature 98 F (36.7 C), temperature source Oral, resp. rate 18, height 5\' 11"  (1.803 m), weight 95.165 kg (209 lb 12.8 oz), SpO2 96 %. GEN: NAD ENT: NCAT EYES: EOMI. Glasses on CV: RRR PULM: CTAB ABD: s/nt/nd; no SP tenderness/fullness SKIN: no rashes/lesions, sternotomy cdi [id number] LEE  3/14 Renal US: R 8.8 with atrophy no HN; L 12.5 with atrophy no HN 3/13 UP/C 0.42 3/13 UA  neg protein, blood, LE/Nit  A 1. AoCKD3, nonoliguric, no obvious nephrotoxins; BL SCr 2.6 sees [name] 1. 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 Repeat acute insult here likely related to overdiuresis; agree with held furosemide for now 3. PVR 3/18 = 0 2. 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 B/l LEE, stable 4. Hypoalbuminemia 5. 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 Hypotension on dopamin and NE 7. HLD 8. Gout 9. Tobacco user 10. Anemia, stable  Plan 1. Cont to hold diuretics 2. Elevate legs / compression hose for edema as being done 3. Will continue to follow 4. Cont daily weights, Daily Renal Panel, Strict I/Os, Avoid nephrotoxins (NSAIDs, judicious IV Contrast)    [name] Grippe MD 03/22/2015, 10:05 AM   Recent Labs Lab 03/20/15 0500 03/21/15 0454 03/22/15 0540  NA 142 144 145  K 4.4 4.2 4.2  CL 109 110 110  CO2 24 22 22   GLUCOSE 111* 114* 105*  BUN 55* 61* 58*  CREATININE 3.51* 3.69* 3.49*  CALCIUM 9.1 9.1 9.2    Recent Labs Lab 03/18/15 0642 03/19/15 0455 03/20/15 0500  WBC 8.3 10.1 10.0  HGB 8.4* 8.1* 8.4*  HCT 25.7* 25.9* 26.3*  MCV 95.5 96.3 96.3  PLT 353  398 445*              

## 2015-03-22 NOTE — Progress Notes (Addendum)
      [address]       [place] [address]             [phone]      12 Days Post-Op Procedure(s) (LRB): CORONARY ARTERY BYPASS GRAFTING (CABG) x four  , using left internal mammary artery and bilateral greater saphenous veins harvested endoscopically (N/A) TRANSESOPHAGEAL ECHOCARDIOGRAM (TEE) (N/A)   Subjective:  [name] [name] continues to feel good.  He is ambulating independently.  + BM  Objective: Vital signs in last 24 hours: Temp:  [97.5 F (36.4 C)-98 F (36.7 C)] 98 F (36.7 C) (03/19 0446) Pulse Rate:  [77-83] 83 (03/19 0446) Cardiac Rhythm:  [-] Normal sinus rhythm (03/18 2035) Resp:  [18] 18 (03/19 0446) BP: (101-123)/(52-63) 119/56 mmHg (03/19 0446) SpO2:  [96 %-99 %] 96 % (03/19 0446) Weight:  [209 lb 12.8 oz (95.165 kg)] 209 lb 12.8 oz (95.165 kg) (03/19 0446)  Intake/Output from previous day: 03/18 0701 - 03/19 0700 In: 482 [P.O.:472; I.V.:10] Out: -   General appearance: alert, cooperative and no distress Heart: regular rate and rhythm Lungs: clear to auscultation bilaterally Abdomen: soft, non-tender; bowel sounds normal; no masses,  no organomegaly Extremities: edema 1-2+ pitting, but improving overall Wound: clean and dry  Lab Results:  Recent Labs  03/20/15 0500  WBC 10.0  HGB 8.4*  HCT 26.3*  PLT 445*   BMET:  Recent Labs  03/21/15 0454 03/22/15 0540  NA 144 145  K 4.2 4.2  CL 110 110  CO2 22 22  GLUCOSE 114* 105*  BUN 61* 58*  CREATININE 3.69* 3.49*  CALCIUM 9.1 9.2    PT/INR: No results for input(s): LABPROT, INR in the last 72 hours. ABG    Component Value Date/Time   PHART 7.327* 03/11/2015 0030   HCO3 21.4 03/11/2015 0030   TCO2 21 03/12/2015 1552   ACIDBASEDEF 4.0* 03/11/2015 0030   O2SAT 70.8 03/17/2015 0504   CBG (last 3)   Recent Labs  03/21/15 1614 03/21/15 2115 03/22/15 0637  GLUCAP 129* 113* 102*    Assessment/Plan: S/P Procedure(s) (LRB): CORONARY ARTERY BYPASS GRAFTING (CABG) x  four  , using left internal mammary artery and bilateral greater saphenous veins harvested endoscopically (N/A) TRANSESOPHAGEAL ECHOCARDIOGRAM (TEE) (N/A)  1. CV- NSR, BP remains low at times- continue Midodrine for now 2. Pulm- no acute issues, continue IS 3. Renal- creatinine down to 3.49, LE edema improving, continue TED hose, continue to hold diuretics, appreciate Nephrology assistance. 4. DM- cbgs remain controlled 5. Dispo- patient stable, creatinine down some today, continue to hold diuretics, LE edema improving, will need SNF at discharge, hopefully early this week depending on creatinine level   LOS: 12 days    [name], [name] 03/22/2015  I have seen and examined the patient and agree with the assessment and plan as outlined.  Okay to transfer off step-down status to telemetry bed.  Rexene Alberts, MD 03/22/2015 12:53 PM

## 2015-03-23 LAB — BASIC METABOLIC PANEL
ANION GAP: 10 (ref 5–15)
BUN: 49 mg/dL — ABNORMAL HIGH (ref 6–20)
CALCIUM: 9.2 mg/dL (ref 8.9–10.3)
CO2: 22 mmol/L (ref 22–32)
CREATININE: 3.28 mg/dL — AB (ref 0.61–1.24)
Chloride: 111 mmol/L (ref 101–111)
GFR, EST AFRICAN AMERICAN: 20 mL/min — AB (ref >60)
GFR, EST NON AFRICAN AMERICAN: 17 mL/min — AB (ref >60)
Glucose, Bld: 142 mg/dL — ABNORMAL HIGH (ref 65–99)
Potassium: 4.5 mmol/L (ref 3.5–5.1)
SODIUM: 143 mmol/L (ref 135–145)

## 2015-03-23 LAB — GLUCOSE, CAPILLARY
Glucose-Capillary: 125 mg/dL — ABNORMAL HIGH (ref 65–99)
Glucose-Capillary: 110 mg/dL — ABNORMAL HIGH (ref 65–99)
Glucose-Capillary: 117 mg/dL — ABNORMAL HIGH (ref 65–99)
Glucose-Capillary: 107 mg/dL — ABNORMAL HIGH (ref 65–99)

## 2015-03-23 MED ORDER — FE FUMARATE-B12-VIT C-FA-IFC PO CAPS: 1.0000 | ORAL_CAPSULE | Freq: Three times a day (TID) | ORAL | Status: DC

## 2015-03-23 MED ORDER — MIDODRINE HCL 10 MG PO TABS: 10.0000 mg | ORAL_TABLET | Freq: Three times a day (TID) | ORAL | Status: DC

## 2015-03-23 MED ORDER — TRAMADOL HCL 50 MG PO TABS: 50.0000 mg | ORAL_TABLET | ORAL | Status: DC | PRN

## 2015-03-23 MED ORDER — ASPIRIN 325 MG PO TBEC: 325.0000 mg | DELAYED_RELEASE_TABLET | Freq: Every day | ORAL | Status: DC

## 2015-03-23 MED ORDER — AMIODARONE HCL 200 MG PO TABS: 200.0000 mg | ORAL_TABLET | Freq: Two times a day (BID) | ORAL | Status: DC

## 2015-03-23 NOTE — Progress Notes (Addendum)
      [address]       [place] [address]             [phone]      13 Days Post-Op Procedure(s) (LRB): CORONARY ARTERY BYPASS GRAFTING (CABG) x four  , using left internal mammary artery and bilateral greater saphenous veins harvested endoscopically (N/A) TRANSESOPHAGEAL ECHOCARDIOGRAM (TEE) (N/A)   Subjective:  Mr. Kuhnle has no complaints.  States he continues to feel stronger.  Objective: Vital signs in last 24 hours: Temp:  [97.8 F (36.6 C)-98.5 F (36.9 C)] 98 F (36.7 C) (03/20 0509) Pulse Rate:  [73-80] 73 (03/20 0509) Cardiac Rhythm:  [-] Normal sinus rhythm (03/19 1944) Resp:  [18] 18 (03/20 0509) BP: (110-117)/(57-60) 113/57 mmHg (03/20 0509) SpO2:  [95 %-100 %] 95 % (03/20 0509) Weight:  [208 lb 15.9 oz (94.8 kg)] 208 lb 15.9 oz (94.8 kg) (03/20 0509)  Intake/Output from previous day: 03/19 0701 - 03/20 0700 In: 600 [P.O.:600] Out: -   General appearance: alert, cooperative and no distress Heart: regular rate and rhythm Lungs: clear to auscultation bilaterally Abdomen: soft, non-tender; bowel sounds normal; no masses,  no organomegaly Extremities: edema pitting, improving Wound: clean and dry  Lab Results: No results for input(s): WBC, HGB, HCT, PLT in the last 72 hours. BMET:  Recent Labs  03/21/15 0454 03/22/15 0540  NA 144 145  K 4.2 4.2  CL 110 110  CO2 22 22  GLUCOSE 114* 105*  BUN 61* 58*  CREATININE 3.69* 3.49*  CALCIUM 9.1 9.2    PT/INR: No results for input(s): LABPROT, INR in the last 72 hours. ABG    Component Value Date/Time   PHART 7.327* 03/11/2015 0030   HCO3 21.4 03/11/2015 0030   TCO2 21 03/12/2015 1552   ACIDBASEDEF 4.0* 03/11/2015 0030   O2SAT 70.8 03/17/2015 0504   CBG (last 3)   Recent Labs  03/22/15 1634 03/22/15 2159 03/23/15 0609  GLUCAP 106* 135* 125*    Assessment/Plan: S/P Procedure(s) (LRB): CORONARY ARTERY BYPASS GRAFTING (CABG) x four  , using left internal mammary artery and  bilateral greater saphenous veins harvested endoscopically (N/A) TRANSESOPHAGEAL ECHOCARDIOGRAM (TEE) (N/A)  1. CV- NSR, BP stable in the low 100s- on Midodrine 2. Pulm- no acute issues, continue IS 3. Renal- BMET ordered for this morning, awaiting results 4. DM- cbgs controlled 5. Dispo- patient remains stable, BMET ordered, will check once resulted, if less than 3 and okay with renal should be medically ready for SNF vs possible d/c home with home health services   LOS: 13 days    BARRETT, ERIN 03/23/2015  patient examined and medical record reviewed,agree with above note. [name] 03/24/2015

## 2015-03-23 NOTE — Care Management Important Message (Signed)
Important Message  Patient Details  Name: [name] [name] MRN: [id number] Date of Birth: 05-Jul-1940   Medicare Important Message Given:  Yes    [name] Danaly Bari 03/23/2015, 3:28 PM

## 2015-03-23 NOTE — Progress Notes (Signed)
Utilization review completed.  

## 2015-03-23 NOTE — Care Management Note (Signed)
Case Management Note  Patient Details  Name: [name] [name] MRN: [id number] Date of Birth: 27-Sep-1940  Subjective/Objective:    Pt OOB to chair, feeling well.  Friend is present but pt states he lives alone and knows he will need some rehab before returning home.  Pt is from [place] and states he has been given names of facilities in his area that provide good care.  Will consult CSW.             Action/Plan: S/p CABG- insurance did not approve rehab stay- spoke with pt on 3/20- who states that he has friends that can assist with groceries and driving him places- and he has someone that can help with household chores. Plan is to return home with [facility] - orders placed for RN/PT/OT- choice offered for agencies in [place]- pt would like to use [facility]- call made and spoke with Vaughan Basta at [place]- however they do not take pt's insurance- she informed this CM that [facility] and [employer]- contracted with pt's insurance- went back and discussed this with pt- and between those two agencies pt would like to try [facility]- call made to them and spoke with Anderson Malta- who accepted referral for [facility] services- they will be able to send out RN by the end of the week. Referral info and orders faxed to [facility]-(fax-[phone]).   Expected Discharge Date:  03/23/15               Expected Discharge Plan:  [place]  In-House Referral:  Clinical Social Work  Discharge planning Services  CM Consult  Post Acute Care Choice:  Home Health Choice offered to:  Patient  DME Arranged:  N/A DME Agency:  NA  HH Arranged:  RN, PT, OT HH Agency:  [facility]  Status of Service:  Completed, signed off  Medicare Important Message Given:  Yes Date Medicare IM Given:    Medicare IM give by:    Date Additional Medicare IM Given:    Additional Medicare Important Message give by:     If discussed at [place] of Stay  Meetings, dates discussed:    Discharge Disposition: Home/home health   Additional Comments:  Dawayne Patricia, RN 03/23/2015, 10:58 AM

## 2015-03-23 NOTE — Progress Notes (Addendum)
CARDIAC REHAB PHASE I   PRE:  Rate/Rhythm: 70 SR  BP:  Sitting: 116/70        SaO2: 100 RA  MODE:  Ambulation: 550 ft   POST:  Rate/Rhythm: 127 ST  BP:  Sitting: 169/54         SaO2: 99 RA  Pt ambulated 550 ft on RA, IV, assist x1, steady gait, tolerated well. Pt c/o mild DOE, denies any other complaints. Cardiac surgery discharge education completed. Reviewed IS, sternal precautions, activity progression, exercise, heart healthy and diabetes diets, daily weights, sodium restrictions, and phase 2 cardiac rehab. Pt verbalized understanding, receptive to education. Pt agrees to phase 2 cardiac rehab referral, will send to [facility] per pt request. Pt to recliner after walk, call bell within reach. Will follow.  [id number] Lenna Sciara, RN, BSN 03/23/2015 11:51 AM

## 2015-03-23 NOTE — Progress Notes (Signed)
Patient ID: [name] [name], male   DOB: 10-17-1940, 75 y.o.   MRN: [id number]  [facility] Progress Note   Assessment/ Plan:   1. Acute renal failure on chronic kidney disease stage III (baseline creatinine ~2.6-f/u with Dr. 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 No urine output charted from overnight, and no labs from this morning. Will restart furosemide 40 mg daily once labs have verified that he has clear renal recovery. Hold losartan 2. 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 3. Anemia: Secondary to surgical losses versus chronic anemia from chronic kidney disease. Check iron studies and consider ESA. 4. Hypotension: Remains on midodrine with blood pressures acceptable limits.  Subjective:   Reports to be feeling well-states he voided at least twice overnight directly into the commode. Denies any chest pain or shortness of breath    Objective:   BP 113/57 mmHg  Pulse 73  Temp(Src) 98 F (36.7 C) (Oral)  Resp 18  Ht 5\' 11"  (1.803 m)  Wt 94.8 kg (208 lb 15.9 oz)  BMI 29.16 kg/m2  SpO2 95%  Intake/Output Summary (Last 24 hours) at 03/23/15 0918 Last data filed at 03/22/15 2152  Gross per 24 hour  Intake    360 ml  Output      0 ml  Net    360 ml   Weight change: -0.365 kg (-12.9 oz)  Physical Exam: [id number] sitting up in a recliner CVS: Pulse regular in rate and rhythm, S1 and S2 normal Resp: Clear to auscultation, no rales Abd: Soft, flat, nontender Ext: 2+ lower extremity edema  Imaging: No results found.  Labs: BMET  Recent Labs Lab 03/17/15 0603 03/18/15 0642 03/19/15 0455 03/20/15 0500 03/21/15 0454 03/22/15 0540  NA 142 143 144 142 144 145  K 4.1 4.4 4.4 4.4  4.2 4.2  CL 108 112* 109 109 110 110  CO2 22 23 24 24 22 22   GLUCOSE 138* 117* 96 111* 114* 105*  BUN 52* 48* 50* 55* 61* 58*  CREATININE 2.92* 2.72* 3.13* 3.51* 3.69* 3.49*  CALCIUM 9.2 8.9 9.0 9.1 9.1 9.2   CBC  Recent Labs Lab 03/17/15 0603 03/18/15 0642 03/19/15 0455 03/20/15 0500  WBC 8.8 8.3 10.1 10.0  HGB 8.6* 8.4* 8.1* 8.4*  HCT 27.1* 25.7* 25.9* 26.3*  MCV 94.8 95.5 96.3 96.3  PLT 324 353 398 445*    Medications:    . amiodarone  200 mg Oral BID  . antiseptic oral rinse  7 mL Mouth Rinse BID  . aspirin EC  325 mg Oral Daily  . bisacodyl  10 mg Oral Daily   Or  . bisacodyl  10 mg Rectal Daily  . cholecalciferol  2,000 Units Oral Daily  . clonazePAM  1 mg Oral Daily  . docusate sodium  200 mg Oral Daily  . enoxaparin (LOVENOX) injection  30 mg Subcutaneous Q24H  . ferrous Q000111Q C-folic acid  1 capsule Oral TID PC  . insulin aspart  0-24 Units Subcutaneous TID AC & HS  . insulin detemir  12 Units Subcutaneous BID  . midodrine  10 mg Oral TID WC  . pantoprazole  40 mg Oral Daily  . pneumococcal 23 valent vaccine  0.5 mL Intramuscular Once  . pravastatin  20 mg Oral q1800  .  sodium chloride flush  10-40 mL Intracatheter Q12H  . sodium chloride flush  3 mL Intravenous Q12H    Elmarie Shiley, MD 03/23/2015, 9:18 AM

## 2015-03-23 NOTE — Progress Notes (Signed)
Physical Therapy Treatment Patient Details Name: [name] [name] MRN: [id number] DOB: 01/11/1940 Today's Date: 03/23/2015    History of Present Illness Patient is a 75 y/o male with hx of CAD, HLD, CKD, HTN, DM and gout presents s/p CABG x4.    PT Comments    Pt progressing well with mobility during acute stay.  Pt has met all goals and PTA will inform supervising PT of patient's progress and need for update goals and recommendations.  Pt remains to required cues for pacing but performs all mobility safely.  Pt only requiring cues for stair training safety.    Follow Up Recommendations  SNF;Supervision for mobility/OOB     Equipment Recommendations  Rolling walker with 5" wheels    Recommendations for Other Services       Precautions / Restrictions Precautions Precautions: Sternal;Fall Precaution Comments: Pt able to recall sternal precautions.   Restrictions Weight Bearing Restrictions: No    Mobility  Bed Mobility Overal bed mobility:  (Pt received sitting in straight chair on arrival.  )                Transfers Overall transfer level: Modified independent Equipment used: None Transfers: Sit to/from Stand Sit to Stand: Modified independent (Device/Increase time)         General transfer comment: good technique and safety.    Ambulation/Gait Ambulation/Gait assistance: Modified independent (Device/Increase time) Ambulation Distance (Feet): 452 Feet Assistive device: None Gait Pattern/deviations: Step-through pattern;Trunk flexed   Gait velocity interpretation: at or above normal speed for age/gender General Gait Details: Required cues for pacing and rest periods.  Pt educated on performing too much when rest breaks are needed.     Stairs Stairs: Yes Stairs assistance: Supervision Stair Management: One rail Right Number of Stairs: 12 General stair comments: Cues for pacing and safety with sequencing and hand placement.    Wheelchair Mobility     Modified Rankin (Stroke Patients Only)       Balance Overall balance assessment: Needs assistance   Sitting balance-Leahy Scale: Good       Standing balance-Leahy Scale: Good                      Cognition Arousal/Alertness: Awake/alert Behavior During Therapy: WFL for tasks assessed/performed Overall Cognitive Status: Within Functional Limits for tasks assessed                      Exercises      General Comments        Pertinent Vitals/Pain Pain Assessment: No/denies pain    Home Living                      Prior Function            PT Goals (current goals can now be found in the care plan section) Acute Rehab PT Goals Potential to Achieve Goals: Good Progress towards PT goals: Progressing toward goals    Frequency  Min 3X/week    PT Plan Current plan remains appropriate    Co-evaluation             End of Session Equipment Utilized During Treatment: Gait belt Activity Tolerance: Patient tolerated treatment well Patient left: in chair;with call bell/phone within reach     Time: 0911-0926 PT Time Calculation (min) (ACUTE ONLY): 15 min  Charges:  $Gait Training: 8-22 mins                      G Codes:      Aimee J Rogers 03/23/2015, 9:34 AM Aimee Rogers, PTA pager [phone]   

## 2015-03-24 DIAGNOSIS — D689 Coagulation defect, unspecified: Secondary | ICD-10-CM | POA: Diagnosis not present

## 2015-03-24 DIAGNOSIS — I2510 Atherosclerotic heart disease of native coronary artery without angina pectoris: Secondary | ICD-10-CM | POA: Diagnosis not present

## 2015-03-24 DIAGNOSIS — E8770 Fluid overload, unspecified: Secondary | ICD-10-CM | POA: Diagnosis not present

## 2015-03-24 DIAGNOSIS — D62 Acute posthemorrhagic anemia: Secondary | ICD-10-CM | POA: Diagnosis not present

## 2015-03-24 DIAGNOSIS — Z23 Encounter for immunization: Secondary | ICD-10-CM | POA: Diagnosis not present

## 2015-03-24 DIAGNOSIS — E118 Type 2 diabetes mellitus with unspecified complications: Secondary | ICD-10-CM | POA: Diagnosis not present

## 2015-03-24 DIAGNOSIS — E1122 Type 2 diabetes mellitus with diabetic chronic kidney disease: Secondary | ICD-10-CM | POA: Diagnosis not present

## 2015-03-24 DIAGNOSIS — N183 Chronic kidney disease, stage 3 (moderate): Secondary | ICD-10-CM | POA: Diagnosis not present

## 2015-03-24 DIAGNOSIS — I4891 Unspecified atrial fibrillation: Secondary | ICD-10-CM | POA: Diagnosis not present

## 2015-03-24 DIAGNOSIS — N179 Acute kidney failure, unspecified: Secondary | ICD-10-CM | POA: Diagnosis not present

## 2015-03-24 LAB — IRON AND TIBC
IRON: 46 ug/dL (ref 45–182)
Saturation Ratios: 17 % — ABNORMAL LOW (ref 17.9–39.5)
TIBC: 267 ug/dL (ref 250–450)
UIBC: 221 ug/dL

## 2015-03-24 LAB — RENAL FUNCTION PANEL
ALBUMIN: 2.8 g/dL — AB (ref 3.5–5.0)
ANION GAP: 8 (ref 5–15)
BUN: 52 mg/dL — ABNORMAL HIGH (ref 6–20)
CHLORIDE: 111 mmol/L (ref 101–111)
CO2: 21 mmol/L — AB (ref 22–32)
Calcium: 8.8 mg/dL — ABNORMAL LOW (ref 8.9–10.3)
Creatinine, Ser: 3.23 mg/dL — ABNORMAL HIGH (ref 0.61–1.24)
GFR calc Af Amer: 20 mL/min — ABNORMAL LOW (ref >60)
GFR calc non Af Amer: 17 mL/min — ABNORMAL LOW (ref >60)
GLUCOSE: 106 mg/dL — AB (ref 65–99)
PHOSPHORUS: 3.3 mg/dL (ref 2.5–4.6)
POTASSIUM: 4.5 mmol/L (ref 3.5–5.1)
Sodium: 140 mmol/L (ref 135–145)

## 2015-03-24 LAB — GLUCOSE, CAPILLARY
Glucose-Capillary: 107 mg/dL — ABNORMAL HIGH (ref 65–99)
Glucose-Capillary: 100 mg/dL — ABNORMAL HIGH (ref 65–99)
Glucose-Capillary: 121 mg/dL — ABNORMAL HIGH (ref 65–99)
Glucose-Capillary: 124 mg/dL — ABNORMAL HIGH (ref 65–99)

## 2015-03-24 LAB — MAGNESIUM: Magnesium: 1.9 mg/dL (ref 1.7–2.4)

## 2015-03-24 LAB — FERRITIN: FERRITIN: 227 ng/mL (ref 24–336)

## 2015-03-24 MED ORDER — FUROSEMIDE 40 MG PO TABS
40.0000 mg | ORAL_TABLET | Freq: Every day | ORAL | Status: DC
  Administered 2015-03-24 – 2015-03-25 (×2): 40 mg via ORAL
  Filled 2015-03-24 (×2): qty 1

## 2015-03-24 NOTE — Progress Notes (Signed)
Patient ID: [name] [name], male   DOB: [birth year], 75 y.o.   MRN: [id number]  [facility] Progress Note   Assessment/ Plan:   1. Acute renal failure on chronic kidney disease stage III (baseline creatinine ~2.6-f/u with Dr. 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 Renal function essentially unchanged overnight with inaccurate charting of urine output. Restart furosemide and plan for discharge tomorrow if creatinine unchanged/better. He will follow-up with Dr.[name] at his [place] office. 2. 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 Anticipate discharge tomorrow if renal function remained stable/improved 3. Anemia: Secondary to surgical losses versus chronic anemia from chronic kidney disease. Check iron studies and consider ESA. 4. Hypotension: Remains on midodrine with blood pressures acceptable limits.  Subjective:   Reports to be feeling well and denies any chest pain or shortness of breath    Objective:   BP 107/52 mmHg  Pulse 73  Temp(Src) 97.8 F (36.6 C) (Oral)  Resp 16  Ht 5\' 11"  (1.803 m)  Wt 94.756 kg (208 lb 14.4 oz)  BMI 29.15 kg/m2  SpO2 97%  Intake/Output Summary (Last 24 hours) at 03/24/15 0946 Last data filed at 03/24/15 0830  Gross per 24 hour  Intake    610 ml  Output    350 ml  Net    260 ml   Weight change: -0.044 kg (-1.5 oz)  Physical Exam: [id number] ambulating around hallways CVS: Pulse regular in rate and rhythm, S1 and S2 normal Resp: Clear to auscultation, no rales Abd: Soft, flat, nontender Ext: 2+ lower extremity edema  Imaging: No results found.  Labs: BMET  Recent Labs Lab 03/18/15 0642 03/19/15 0455 03/20/15 0500 03/21/15 0454 03/22/15 0540  03/23/15 1043 03/24/15 0533  NA 143 144 142 144 145 143 140  K 4.4 4.4 4.4 4.2 4.2 4.5 4.5  CL 112* 109 109 110 110 111 111  CO2 23 24 24 22 22 22  21*  GLUCOSE 117* 96 111* 114* 105* 142* 106*  BUN 48* 50* 55* 61* 58* 49* 52*  CREATININE 2.72* 3.13* 3.51* 3.69* 3.49* 3.28* 3.23*  CALCIUM 8.9 9.0 9.1 9.1 9.2 9.2 8.8*  PHOS  --   --   --   --   --   --  3.3   CBC  Recent Labs Lab 03/18/15 0642 03/19/15 0455 03/20/15 0500  WBC 8.3 10.1 10.0  HGB 8.4* 8.1* 8.4*  HCT 25.7* 25.9* 26.3*  MCV 95.5 96.3 96.3  PLT 353 398 445*    Medications:    . amiodarone  200 mg Oral BID  . antiseptic oral rinse  7 mL Mouth Rinse BID  . aspirin EC  325 mg Oral Daily  . bisacodyl  10 mg Oral Daily   Or  . bisacodyl  10 mg Rectal Daily  . cholecalciferol  2,000 Units Oral Daily  . clonazePAM  1 mg Oral Daily  . docusate sodium  200 mg Oral Daily  . enoxaparin (LOVENOX) injection  30 mg Subcutaneous Q24H  . ferrous Q000111Q C-folic acid  1 capsule Oral TID PC  . insulin aspart  0-24 Units Subcutaneous TID AC & HS  . insulin detemir  12 Units Subcutaneous BID  . midodrine  10 mg Oral TID WC  . pantoprazole  40 mg Oral Daily  .  pneumococcal 23 valent vaccine  0.5 mL Intramuscular Once  . pravastatin  20 mg Oral q1800  . sodium chloride flush  10-40 mL Intracatheter Q12H  . sodium chloride flush  3 mL Intravenous Q12H    Elmarie Shiley, MD 03/24/2015, 9:46 AM

## 2015-03-24 NOTE — Progress Notes (Addendum)
      [address]       [place] [address]             [phone]      14 Days Post-Op Procedure(s) (LRB): CORONARY ARTERY BYPASS GRAFTING (CABG) x four  , using left internal mammary artery and bilateral greater saphenous veins harvested endoscopically (N/A) TRANSESOPHAGEAL ECHOCARDIOGRAM (TEE) (N/A)   Subjective:  [name] [name] has no complaints this morning.  He continues to feel good.  He is ambulating independently.  + BM  Objective: Vital signs in last 24 hours: Temp:  [97.5 F (36.4 C)-97.8 F (36.6 C)] 97.8 F (36.6 C) (03/21 0500) Pulse Rate:  [73-123] 73 (03/20 1321) Cardiac Rhythm:  [-] Normal sinus rhythm (03/20 2000) Resp:  [16-18] 16 (03/21 0500) BP: (107-126)/(52-70) 107/52 mmHg (03/21 0500) SpO2:  [97 %-100 %] 97 % (03/21 0500) Weight:  [208 lb 14.4 oz (94.756 kg)] 208 lb 14.4 oz (94.756 kg) (03/21 0500)  Intake/Output from previous day: 03/20 0701 - 03/21 0700 In: 730 [P.O.:720; I.V.:10] Out: 350 [Urine:350]  General appearance: alert, cooperative and no distress Heart: regular rate and rhythm Lungs: clear to auscultation bilaterally Abdomen: soft, non-tender; bowel sounds normal; no masses,  no organomegaly Extremities: edema +1 edema Wound: clean and dry  Lab Results: No results for input(s): WBC, HGB, HCT, PLT in the last 72 hours. BMET:  Recent Labs  03/23/15 1043 03/24/15 0533  NA 143 140  K 4.5 4.5  CL 111 111  CO2 22 21*  GLUCOSE 142* 106*  BUN 49* 52*  CREATININE 3.28* 3.23*  CALCIUM 9.2 8.8*    PT/INR: No results for input(s): LABPROT, INR in the last 72 hours. ABG    Component Value Date/Time   PHART 7.327* 03/11/2015 0030   HCO3 21.4 03/11/2015 0030   TCO2 21 03/12/2015 1552   ACIDBASEDEF 4.0* 03/11/2015 0030   O2SAT 70.8 03/17/2015 0504   CBG (last 3)   Recent Labs  03/23/15 1632 03/23/15 2154 03/24/15 0604  GLUCAP 117* 107* 107*    Assessment/Plan: S/P Procedure(s) (LRB): CORONARY ARTERY BYPASS  GRAFTING (CABG) x four  , using left internal mammary artery and bilateral greater saphenous veins harvested endoscopically (N/A) TRANSESOPHAGEAL ECHOCARDIOGRAM (TEE) (N/A)  1. CV- remains hemodynamically stable, on Midodrine for orthostatic Hypotension 2. Pulm- no acute issues, continue IS 3. Renal- creatinine stable, at 3.23 today, Nephrology following, diuretics restarted yesterday 4. DM- cbgs controlled 5. Deconditioning- insurance declines SNF, patient is okay to go home once medically stable, H/H orders placed 6. Dispo- patient stable, creatinine stable at 3.23, continue current care   LOS: 14 days    BARRETT, ERIN 03/24/2015  Clear from cardiac status DC home when cleared by renal, on Lasix 40/day patient examined and medical record reviewed,agree with above note. [name] [name] 03/24/2015

## 2015-03-24 NOTE — Progress Notes (Signed)
CARDIAC REHAB PHASE I   PRE:  Rate/Rhythm: 79 SR    BP: sitting 164/68    SaO2:   MODE:  Ambulation: 730 ft   POST:  Rate/Rhythm: 123 ST    BP: sitting 131/52     SaO2:   Tolerated well. HR still elevated with SOB. Otherwise did well. BP decreased after walk (different machine). [place], ACSM 03/24/2015 12:07 PM

## 2015-03-24 NOTE — Progress Notes (Signed)
CSW consulted for SNF placement. Patient is walking 543ft, therefore insurance will not approve SNF stay.   CSW signing off.  Percell Locus Lynze Reddy LCSWA [phone]

## 2015-03-25 ENCOUNTER — Inpatient Hospital Stay (HOSPITAL_COMMUNITY): Payer: Medicare HMO

## 2015-03-25 LAB — BASIC METABOLIC PANEL
ANION GAP: 14 (ref 5–15)
Anion gap: 15 (ref 5–15)
BUN: 54 mg/dL — AB (ref 6–20)
BUN: 47 mg/dL — AB (ref 6–20)
CALCIUM: 9.1 mg/dL (ref 8.9–10.3)
CALCIUM: 9.4 mg/dL (ref 8.9–10.3)
CHLORIDE: 108 mmol/L (ref 101–111)
CO2: 20 mmol/L — ABNORMAL LOW (ref 22–32)
CO2: 18 mmol/L — AB (ref 22–32)
CREATININE: 3.43 mg/dL — AB (ref 0.61–1.24)
Chloride: 108 mmol/L (ref 101–111)
Creatinine, Ser: 3.27 mg/dL — ABNORMAL HIGH (ref 0.61–1.24)
GFR calc Af Amer: 20 mL/min — ABNORMAL LOW (ref >60)
GFR, EST AFRICAN AMERICAN: 19 mL/min — AB (ref >60)
GFR, EST NON AFRICAN AMERICAN: 16 mL/min — AB (ref >60)
GFR, EST NON AFRICAN AMERICAN: 17 mL/min — AB (ref >60)
GLUCOSE: 117 mg/dL — AB (ref 65–99)
Glucose, Bld: 117 mg/dL — ABNORMAL HIGH (ref 65–99)
Potassium: 4.3 mmol/L (ref 3.5–5.1)
Potassium: 4.3 mmol/L (ref 3.5–5.1)
SODIUM: 143 mmol/L (ref 135–145)
Sodium: 140 mmol/L (ref 135–145)

## 2015-03-25 LAB — GLUCOSE, CAPILLARY
Glucose-Capillary: 122 mg/dL — ABNORMAL HIGH (ref 65–99)
Glucose-Capillary: 95 mg/dL (ref 65–99)

## 2015-03-25 MED ORDER — FUROSEMIDE 40 MG PO TABS: 40.0000 mg | ORAL_TABLET | Freq: Every day | ORAL | Status: DC

## 2015-03-25 NOTE — Care Management Note (Signed)
Case Management Note  Patient Details  Name: [name] [name] MRN: [id number] Date of Birth: 03/24/1940  Subjective/Objective:    Pt OOB to chair, feeling well.  Friend is present but pt states he lives alone and knows he will need some rehab before returning home.  Pt is from [place] and states he has been given names of facilities in his area that provide good care.  Will consult CSW.             Action/Plan: S/p CABG- insurance did not approve rehab stay- spoke with pt on 3/20- who states that he has friends that can assist with groceries and driving him places- and he has someone that can help with household chores. Plan is to return home with [facility] - orders placed for RN/PT/OT- choice offered for agencies in [place]- pt would like to use [facility]- call made and spoke with Vaughan Basta at [place]- however they do not take pt's insurance- she informed this CM that [facility] and [employer]- contracted with pt's insurance- went back and discussed this with pt- and between those two agencies pt would like to try [facility]- call made to them and spoke with Anderson Malta- who accepted referral for [facility] services- they will be able to send out RN by the end of the week. Referral info and orders faxed to [facility]-(fax-[phone]).   Expected Discharge Date:  03/25/15               Expected Discharge Plan:  [place]  In-House Referral:  Clinical Social Work  Discharge planning Services  CM Consult  Post Acute Care Choice:  Home Health Choice offered to:  Patient  DME Arranged:  N/A DME Agency:  NA  HH Arranged:  RN, PT, OT HH Agency:  [facility]  Status of Service:  Completed, signed off  Medicare Important Message Given:  Yes Date Medicare IM Given:    Medicare IM give by:    Date Additional Medicare IM Given:    Additional Medicare Important Message give by:     If discussed at [place] of Stay  Meetings, dates discussed:  03/24/15  Discharge Disposition: Home/home health   Additional Comments:  03/25/15- 1415- Marvetta Gibbons RN, BSN- pt for d/c today- have notified [facility] and faxed d/c summary for start of [place] services-   Dawayne Patricia, RN 03/25/2015, 2:14 PM

## 2015-03-25 NOTE — Progress Notes (Signed)
Physical Therapy Treatment Patient Details Name: [name] [name] MRN: [id number] DOB: [birth year] Today's Date: 03/25/2015    History of Present Illness Patient is a 75 y/o male with hx of CAD, HLD, CKD, HTN, DM and gout presents s/p CABG x4.    PT Comments    Pt progressing well.  Will return home with HHPT pending results of lab work in regards to kidney function.  Pt educated to ambulate in halls this pm.    Follow Up Recommendations  Home health PT     Equipment Recommendations       Recommendations for Other Services       Precautions / Restrictions Precautions Precautions: Sternal;Fall Precaution Comments: Pt able to recall sternal precautions.   Restrictions Weight Bearing Restrictions: No    Mobility  Bed Mobility Overal bed mobility:  (Sitting in recliner chair on arrival.  )                Transfers Overall transfer level: Modified independent Equipment used: None Transfers: Sit to/from Stand Sit to Stand: Modified independent (Device/Increase time)         General transfer comment: good technique and safety.    Ambulation/Gait Ambulation/Gait assistance: Modified independent (Device/Increase time) Ambulation Distance (Feet): 500 Feet Assistive device: None     Gait velocity interpretation: at or above normal speed for age/gender General Gait Details: Required cues for pacing and rest periods.  Pt educated on performing too much when rest breaks are needed.     Stairs Stairs: Yes Stairs assistance: Modified independent (Device/Increase time) Stair Management: One rail Left;Two rails Number of Stairs: 10 General stair comments: Pt demonstrated good technique with reciprocal pattern.    Wheelchair Mobility    Modified Rankin (Stroke Patients Only)       Balance     Sitting balance-Leahy Scale: Good       Standing balance-Leahy Scale: Good Standing balance comment: Will focus on high level balance activities during duration of  acute care stay.                      Cognition Arousal/Alertness: Awake/alert Behavior During Therapy: WFL for tasks assessed/performed Overall Cognitive Status: Within Functional Limits for tasks assessed                      Exercises      General Comments        Pertinent Vitals/Pain Pain Assessment: No/denies pain    Home Living                      Prior Function            PT Goals (current goals can now be found in the care plan section) Acute Rehab PT Goals Patient Stated Goal: to return to independence Potential to Achieve Goals: Good Progress towards PT goals: Goals met and updated - see care plan (Spoke with supervising PT in regards to goals met will focus on high level balance activities to progress pt as appropriate.  )    Frequency  Min 3X/week    PT Plan Current plan remains appropriate (will focus on high level balance activities until d/c.  )    Co-evaluation             End of Session Equipment Utilized During Treatment: Gait belt Activity Tolerance: Patient tolerated treatment well Patient left: in chair;with call bell/phone within reach     Time: [phone]  PT Time Calculation (min) (ACUTE ONLY): 14 min  Charges:  $Gait Training: 8-22 mins                    G Codes:      Cristela Blue 2015-04-11, 12:09 PM  [place], PTA pager [phone]

## 2015-03-25 NOTE — Progress Notes (Signed)
Order received to discharge.  Telemetry removed and CCMD notified.  IR removed tunneled PICC.  Discharge education given to Pt with significant other at bedside.  Pt indicates understanding of all information provided.  Pt denies chest pain or sob.  Pt discharged via [place] by this RN.  Pt stable at discharge.  No s/s of distress.

## 2015-03-25 NOTE — Progress Notes (Addendum)
Patient ID: [name] [name], male   DOB: 25-May-1940, 75 y.o.   MRN: [id number]  [facility] Progress Note   Assessment/ Plan:   1. Acute renal failure on chronic kidney disease stage III (baseline creatinine ~2.6-f/u with [name] [name]): Likely with background ischemic nephropathy given discrepant renal size and acute injury from sustained hypotension postoperatively +/- effect of diuretic therapy. Slight rise of creatinine after furosemide restarted yesterday-likely reflective of RAS activation from this medication. [name] [name] is (as expected) frustrated at the prospect of having to stay here another night after a prolonged hospitalization. He understands that he may need to if our recheck labs again this morning indicate continued worsening of his renal function-if creatinine is <3.2, I would be comfortable discharging him to follow-up with his primary nephrologist in [place]. 2. 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 Anticipate discharge today if creatinine appears better on labs rechecked. 3. Anemia: Secondary to surgical losses versus chronic anemia from chronic kidney disease.  4. Hypotension: Remains on midodrine with blood pressures acceptable limits.  Subjective:   Reports to be feeling well and denies any chest pain or shortness of breath    Objective:   BP 106/51 mmHg  Pulse 69  Temp(Src) 97.8 F (36.6 C) (Oral)  Resp 18  Ht 5\' 11"  (1.803 m)  Wt 93.94 kg (207 lb 1.6 oz)  BMI 28.90 kg/m2  SpO2 99%  Intake/Output Summary (Last 24 hours) at 03/25/15 1054 Last data filed at 03/25/15 0900  Gross per 24 hour  Intake    830 ml  Output    550 ml  Net    280 ml   Weight change: -0.816 kg (-1 lb 12.8 oz)  Physical Exam: [id number] sitting in recliner CVS: Pulse regular in rate and rhythm, S1 and S2 normal Resp: Clear to  auscultation, no rales Abd: Soft, flat, nontender Ext: 2+ lower extremity edema  Imaging: No results found.  Labs: BMET  Recent Labs Lab 03/19/15 0455 03/20/15 0500 03/21/15 0454 03/22/15 0540 03/23/15 1043 03/24/15 0533 03/25/15 0535  NA 144 142 144 145 143 140 143  K 4.4 4.4 4.2 4.2 4.5 4.5 4.3  CL 109 109 110 110 111 111 108  CO2 24 24 22 22 22  21* 20*  GLUCOSE 96 111* 114* 105* 142* 106* 117*  BUN 50* 55* 61* 58* 49* 52* 54*  CREATININE 3.13* 3.51* 3.69* 3.49* 3.28* 3.23* 3.43*  CALCIUM 9.0 9.1 9.1 9.2 9.2 8.8* 9.1  PHOS  --   --   --   --   --  3.3  --    CBC  Recent Labs Lab 03/19/15 0455 03/20/15 0500  WBC 10.1 10.0  HGB 8.1* 8.4*  HCT 25.9* 26.3*  MCV 96.3 96.3  PLT 398 445*    Medications:    . amiodarone  200 mg Oral BID  . antiseptic oral rinse  7 mL Mouth Rinse BID  . aspirin EC  325 mg Oral Daily  . bisacodyl  10 mg Oral Daily   Or  . bisacodyl  10 mg Rectal Daily  . cholecalciferol  2,000 Units Oral Daily  . clonazePAM  1 mg Oral Daily  . docusate sodium  200 mg Oral Daily  . enoxaparin (LOVENOX) injection  30 mg Subcutaneous Q24H  . ferrous Q000111Q C-folic acid  1 capsule Oral TID PC  . furosemide  40 mg Oral Daily  . insulin aspart  0-24 Units Subcutaneous TID AC & HS  . insulin detemir  12 Units Subcutaneous BID  . midodrine  10 mg Oral TID WC  . pantoprazole  40 mg Oral Daily  . pravastatin  20 mg Oral q1800  . sodium chloride flush  10-40 mL Intracatheter Q12H  . sodium chloride flush  3 mL Intravenous Q12H   [name] Shiley, MD 03/25/2015, 10:54 AM

## 2015-03-25 NOTE — Progress Notes (Signed)
Call placed to Dr. Posey Pronto with result of repeat blood work.  Per Dr. Posey Pronto, notify cardiology that it is OK for Pt to discharge.  Will notify cardiology and continue to monitor.

## 2015-03-25 NOTE — Progress Notes (Signed)
[address]       [place] [address]             [phone]      15 Days Post-Op Procedure(s) (LRB): CORONARY ARTERY BYPASS GRAFTING (CABG) x four  , using left internal mammary artery and bilateral greater saphenous veins harvested endoscopically (N/A) TRANSESOPHAGEAL ECHOCARDIOGRAM (TEE) (N/A) Subjective: Feels a little frustrated about kidney issues but overall feels pretty well  Objective: Vital signs in last 24 hours: Temp:  [97.8 F (36.6 C)-98.3 F (36.8 C)] 97.8 F (36.6 C) (03/22 0518) Pulse Rate:  [69-74] 69 (03/22 0518) Cardiac Rhythm:  [-] Normal sinus rhythm (03/21 2037) Resp:  [18-19] 18 (03/22 0518) BP: (106-134)/(50-62) 106/51 mmHg (03/22 0518) SpO2:  [99 %-100 %] 99 % (03/22 0518) Weight:  [207 lb 1.6 oz (93.94 kg)] 207 lb 1.6 oz (93.94 kg) (03/22 0518)  Hemodynamic parameters for last 24 hours:    Intake/Output from previous day: 03/21 0701 - 03/22 0700 In: 710 [P.O.:700; I.V.:10] Out: 300 [Urine:300] Intake/Output this shift:    General appearance: alert, cooperative and no distress Heart: regular rate and rhythm Lungs: clear to auscultation bilaterally Abdomen: benign Extremities: + edema Wound: incis healing well  Lab Results: No results for input(s): WBC, HGB, HCT, PLT in the last 72 hours. BMET:  Recent Labs  03/24/15 0533 03/25/15 0535  NA 140 143  K 4.5 4.3  CL 111 108  CO2 21* 20*  GLUCOSE 106* 117*  BUN 52* 54*  CREATININE 3.23* 3.43*  CALCIUM 8.8* 9.1    PT/INR: No results for input(s): LABPROT, INR in the last 72 hours. ABG    Component Value Date/Time   PHART 7.327* 03/11/2015 0030   HCO3 21.4 03/11/2015 0030   TCO2 21 03/12/2015 1552   ACIDBASEDEF 4.0* 03/11/2015 0030   O2SAT 70.8 03/17/2015 0504   CBG (last 3)   Recent Labs  03/24/15 1638 03/24/15 2120 03/25/15 0620  GLUCAP 121* 124* 122*    Meds Scheduled Meds: . amiodarone  200 mg Oral BID  . antiseptic oral rinse  7 mL Mouth  Rinse BID  . aspirin EC  325 mg Oral Daily  . bisacodyl  10 mg Oral Daily   Or  . bisacodyl  10 mg Rectal Daily  . cholecalciferol  2,000 Units Oral Daily  . clonazePAM  1 mg Oral Daily  . docusate sodium  200 mg Oral Daily  . enoxaparin (LOVENOX) injection  30 mg Subcutaneous Q24H  . ferrous Q000111Q C-folic acid  1 capsule Oral TID PC  . furosemide  40 mg Oral Daily  . insulin aspart  0-24 Units Subcutaneous TID AC & HS  . insulin detemir  12 Units Subcutaneous BID  . midodrine  10 mg Oral TID WC  . pantoprazole  40 mg Oral Daily  . pravastatin  20 mg Oral q1800  . sodium chloride flush  10-40 mL Intracatheter Q12H  . sodium chloride flush  3 mL Intravenous Q12H   Continuous Infusions: . sodium chloride     PRN Meds:.clotrimazole-betamethasone, colchicine, ondansetron (ZOFRAN) IV, oxyCODONE, sodium chloride flush, sodium chloride flush, traMADol  Xrays No results found.  Assessment/Plan: S/P Procedure(s) (LRB): CORONARY ARTERY BYPASS GRAFTING (CABG) x four  , using left internal mammary artery and bilateral greater saphenous veins harvested endoscopically (N/A) TRANSESOPHAGEAL ECHOCARDIOGRAM (TEE) (N/A)   1 creat is trending up - await nephrology to see if they are ok with discharge. He otherwise appears quite  stable   LOS: 15 days    [name],[name] [name] 03/25/2015

## 2015-03-25 NOTE — Progress Notes (Signed)
CARDIAC REHAB PHASE I   PRE:  Rate/Rhythm: 85 SR  BP:  Supine:   Sitting: 135/72  Standing:    SaO2:   MODE:  Ambulation: 890 ft   POST:  Rate/Rhythm: 104  BP:  Supine:   Sitting: 192/73 recheck in 5 minutes 148/69  Standing:    SaO2: 100 RA 1045-1122 Pt tolerated ambulation well without c/o. He is DOE but does not c/o of this walking. RA sat after walk 100%. Pt to recliner after walk with call light in reach. BP right after walk 192/73. Had pt to rest rechecked BP in 5 minutes 148/69, dyspnea improved with rest also. Emotional support provided with extended hospital stay, pt very anxious to discharge.     Rodney Langton RN 03/25/2015 11:23 AM

## 2015-03-26 DIAGNOSIS — I2510 Atherosclerotic heart disease of native coronary artery without angina pectoris: Secondary | ICD-10-CM | POA: Diagnosis not present

## 2015-03-26 DIAGNOSIS — I10 Essential (primary) hypertension: Secondary | ICD-10-CM | POA: Diagnosis not present

## 2015-03-26 DIAGNOSIS — E119 Type 2 diabetes mellitus without complications: Secondary | ICD-10-CM | POA: Diagnosis not present

## 2015-04-08 ENCOUNTER — Ambulatory Visit (INDEPENDENT_AMBULATORY_CARE_PROVIDER_SITE_OTHER): Payer: Medicare HMO | Admitting: Cardiovascular Disease

## 2015-04-08 ENCOUNTER — Encounter: Payer: Self-pay | Admitting: Cardiovascular Disease

## 2015-04-08 VITALS — BP 112/71 | HR 69 | Ht 70.0 in | Wt 204.0 lb

## 2015-04-08 DIAGNOSIS — E785 Hyperlipidemia, unspecified: Secondary | ICD-10-CM

## 2015-04-08 DIAGNOSIS — Z9289 Personal history of other medical treatment: Secondary | ICD-10-CM

## 2015-04-08 DIAGNOSIS — N179 Acute kidney failure, unspecified: Secondary | ICD-10-CM | POA: Diagnosis not present

## 2015-04-08 DIAGNOSIS — I4891 Unspecified atrial fibrillation: Secondary | ICD-10-CM

## 2015-04-08 DIAGNOSIS — Z951 Presence of aortocoronary bypass graft: Secondary | ICD-10-CM

## 2015-04-08 DIAGNOSIS — Z87898 Personal history of other specified conditions: Secondary | ICD-10-CM

## 2015-04-08 DIAGNOSIS — I6521 Occlusion and stenosis of right carotid artery: Secondary | ICD-10-CM | POA: Diagnosis not present

## 2015-04-08 MED ORDER — LOVASTATIN 20 MG PO TABS: 20.0000 mg | ORAL_TABLET | Freq: Every day | ORAL | Status: DC

## 2015-04-08 NOTE — Patient Instructions (Signed)
Your physician wants you to follow-up in: 4 months with Dr. Virgina Jock will receive a reminder letter in the mail two months in advance. If you don't receive a letter, please call our office to schedule the follow-up appointment.  Your physician has recommended you make the following change in your medication:   STOP AMIODARONE  START LOVASTATIN 20 MG DAILY  You have been referred to [facility]  Thank you for choosing [facility]!!

## 2015-04-08 NOTE — Progress Notes (Signed)
Patient ID: [name] [name], male   DOB: 12/21/1940, 75 y.o.   MRN: [id number]      SUBJECTIVE: The patient returns for follow-up after undergoing 4 vessel coronary artery bypass graft surgery with a LIMA to the LAD and SVG's to diagonal, obtuse marginal, and distal circumflex posterolateral branches. He developed acute on chronic renal failure in the context of stage III CKD.. It was thought he developed this from sustained hypotension in the postoperative period with diuretic therapy also contributing. He follows with nephrology. He also developed postoperative atrial fibrillation and was started on amiodarone.  He is feeling better and regaining his strength. He is due to follow-up with both vascular surgery and nephrology. He has only been off of lovastatin for 3 days. He denies chest pain, shortness of breath, leg swelling, and palpitations. He is no longer on losartan and amlodipine.  Review of Systems: As per "subjective", otherwise negative.  No Known Allergies  Current Outpatient Prescriptions  Medication Sig Dispense Refill  . acetaminophen (TYLENOL) 325 MG tablet Take 2 tablets (650 mg total) by mouth every 4 (four) hours as needed for headache or mild pain.    Marland Kitchen amiodarone (PACERONE) 200 MG tablet Take 200 mg by mouth daily.    Marland Kitchen aspirin EC 325 MG EC tablet Take 1 tablet (325 mg total) by mouth daily. 30 tablet 0  . Cholecalciferol (VITAMIN D3) 2000 UNITS TABS Take 2,000 Units by mouth daily.     . clonazePAM (KLONOPIN) 1 MG tablet Take 1 mg by mouth daily.     . clotrimazole-betamethasone (LOTRISONE) cream Apply 1 application topically as needed (for skin).     . colchicine 0.6 MG tablet Take 0.6 mg by mouth daily as needed (for gout).     . ferrous Q000111Q C-folic acid (TRINSICON / FOLTRIN) capsule Take 1 capsule by mouth 3 (three) times daily after meals. (Patient taking differently: Take 1 capsule by mouth daily. ) 90 capsule 1  . glimepiride (AMARYL) 2 MG tablet  Take 2 mg by mouth 2 (two) times daily.      . Lactobacillus (ACIDOPHILUS) CAPS Take 1 capsule by mouth daily as needed (for immune system).     . metroNIDAZOLE (METROGEL) 1 % gel Apply 1 application topically daily.      . midodrine (PROAMATINE) 10 MG tablet Take 1 tablet (10 mg total) by mouth 3 (three) times daily with meals. 90 tablet 1  . nitroGLYCERIN (NITROSTAT) 0.4 MG SL tablet Place 1 tablet (0.4 mg total) under the tongue every 5 (five) minutes as needed. (Patient taking differently: Place 0.4 mg under the tongue every 5 (five) minutes as needed for chest pain. ) 25 tablet 3  . sodium polystyrene (KAYEXALATE) 15 GM/60ML suspension Take 30 g by mouth every 30 (thirty) days.     . traMADol (ULTRAM) 50 MG tablet Take 1-2 tablets (50-100 mg total) by mouth every 4 (four) hours as needed for moderate pain. 30 tablet 0   No current facility-administered medications for this visit.    Past Medical History  Diagnosis Date  . Coronary artery disease     quiescent on medical therapy,abnormal exercise Cardiolite suggestive of apical ischemia;EF 54%,02/10  . Hyperlipidemia     abnormal LFT's,resolved,secondary to lipitor  . Chronic kidney disease     chronic renal insufficiency,single functional kidney  . Tobacco abuse   . 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 Hypertension   . Shortness of breath dyspnea  with exertion  . Diabetes mellitus without complication (HCC)     Type 2  . Arthritis   . Gout   . Abnormal nuclear stress test     Past Surgical History  Procedure Laterality Date  . Bladder surgery  1980  . Penectomy      surgery for carcinoma of penis  . 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 Cardiac catheterization N/A 03/02/2015    Procedure: Left Heart Cath and Coronary Angiography;  Surgeon: Burnell Blanks, MD;  Location: [facility];  Service: Cardiovascular;  Laterality: N/A;  . Cardiac catheterization N/A  03/02/2015    Procedure: Intravascular Ultrasound/IVUS;  Surgeon: Burnell Blanks, MD;  Location: [facility];  Service: Cardiovascular;  Laterality: N/A;  . Coronary artery bypass graft N/A 03/10/2015    Procedure: CORONARY ARTERY BYPASS GRAFTING (CABG) x four  , using left internal mammary artery and bilateral greater saphenous veins harvested endoscopically;  Surgeon: Ivin Poot, MD;  Location: [facility];  Service: Open Heart Surgery;  Laterality: N/A;  . Tee without cardioversion N/A 03/10/2015    Procedure: TRANSESOPHAGEAL ECHOCARDIOGRAM (TEE);  Surgeon: Ivin Poot, MD;  Location: [facility];  Service: Open Heart Surgery;  Laterality: N/A;    Social History   Social History  . Marital Status: Widowed    Spouse Name: N/A  . Number of Children: N/A  . Years of Education: N/A   Occupational History  . Not on file.   Social History Main Topics  . Smoking status: Former Smoker -- 1.00 packs/day for 25 years    Types: Cigarettes    Start date: 01/04/1955    Quit date: 01/03/1970  . Smokeless tobacco: Never Used  . Alcohol Use: No  . Drug Use: No  . Sexual Activity: Not on file   Other Topics Concern  . Not on file   Social History Narrative     Filed Vitals:   04/08/15 1103  BP: 112/71  Pulse: 69  Height: 5\' 10"  (1.778 m)  Weight: 204 lb (92.534 kg)    PHYSICAL EXAM General: NAD HEENT: Normal. Neck: No JVD, no thyromegaly. Lungs: CTAB CV: Well healed midline sternotomy scar. Regular rate and rhythm, normal S1/S2, no S3/S4, no murmur. No pretibial or periankle edema.  No carotid bruit.   Abdomen: Soft, nontender, no distention.  Neurologic: Alert and oriented.  Psych: Normal affect. Skin: Normal. Musculoskeletal: No gross deformities.  ECG: Most recent ECG reviewed.      ASSESSMENT AND PLAN: 1. CAD s/p CABG: On ASA. Should restart statin. Will make cardiac rehab referral.  2. Essential HTN: No longer on amlodipine nor losartan. On midodrine for  orthostatin hypotension. BP normal today.  3. Hyperlipidemia: Had been on low-intensity statin therapy with lovastatin 20 mg. Should restart.  4. High-grade asymptomatic right internal carotid artery stenosis: Follows with vascular. CEA can be planned.  5. Post-op atrial fibrillation: Will d/c amiodarone.  Dispo: f/u 4 months.   Kate Sable, M.D., F.A.C.C.

## 2015-04-16 ENCOUNTER — Ambulatory Visit: Payer: Medicare HMO | Admitting: Vascular Surgery

## 2015-04-16 ENCOUNTER — Encounter (HOSPITAL_COMMUNITY): Payer: Medicare HMO

## 2015-04-21 ENCOUNTER — Other Ambulatory Visit: Payer: Self-pay | Admitting: Physician Assistant

## 2015-04-21 ENCOUNTER — Other Ambulatory Visit: Payer: Self-pay | Admitting: Cardiothoracic Surgery

## 2015-04-21 DIAGNOSIS — Z951 Presence of aortocoronary bypass graft: Secondary | ICD-10-CM

## 2015-04-22 ENCOUNTER — Ambulatory Visit (INDEPENDENT_AMBULATORY_CARE_PROVIDER_SITE_OTHER): Payer: Self-pay | Admitting: Cardiothoracic Surgery

## 2015-04-22 ENCOUNTER — Other Ambulatory Visit: Payer: Self-pay | Admitting: *Deleted

## 2015-04-22 ENCOUNTER — Ambulatory Visit
Admission: RE | Admit: 2015-04-22 | Discharge: 2015-04-22 | Disposition: A | Discharge status: Home / Self Care | Payer: Medicare HMO | Source: Ambulatory Visit | Attending: Cardiothoracic Surgery | Admitting: Cardiothoracic Surgery

## 2015-04-22 ENCOUNTER — Encounter: Payer: Self-pay | Admitting: Cardiothoracic Surgery

## 2015-04-22 VITALS — BP 133/72 | HR 79 | Resp 20 | Ht 70.0 in | Wt 204.0 lb

## 2015-04-22 DIAGNOSIS — Z951 Presence of aortocoronary bypass graft: Secondary | ICD-10-CM | POA: Diagnosis not present

## 2015-04-22 NOTE — Progress Notes (Signed)
PCP is Monico Blitz, MD Referring Provider is Burnell Blanks*  Chief Complaint  Patient presents with  . Routine Post Op    f/u from surgery with CXR s/p Coronary artery bypass grafting x4, 03/10/15     [id number] postop visit one month after multivessel CABG. Patient is been doing well at home, walking 15-20 minutes daily without difficulty. He is noted improved strength, improved appetite and sleeping habits. Surgical incision is well-healed. Chest x-ray today shows clear lung fields no pleural effusion.  Patient has chronic renal insufficiency but is not on dialysis. The patient had postoperative low blood pressure and was discharged home on midodrine 3 times a day without beta blocker. Blood pressure today is 133/80, will start to wean off the midodrine . He has maintained a sinus rhythm.   Past Medical History  Diagnosis Date  . Coronary artery disease     quiescent on medical therapy,abnormal exercise Cardiolite suggestive of apical ischemia;EF 54%,02/10  . Hyperlipidemia     abnormal LFT's,resolved,secondary to lipitor  . Chronic kidney disease     chronic renal insufficiency,single functional kidney  . Tobacco abuse   . 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 Hypertension   . Shortness of breath dyspnea     with exertion  . Diabetes mellitus without complication (HCC)     Type 2  . Arthritis   . Gout   . Abnormal nuclear stress test     Past Surgical History  Procedure Laterality Date  . Bladder surgery  1980  . Penectomy      surgery for carcinoma of penis  . 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 Cardiac catheterization N/A 03/02/2015    Procedure: Left Heart Cath and Coronary Angiography;  Surgeon: Burnell Blanks, MD;  Location: [facility];  Service: Cardiovascular;  Laterality: N/A;  . Cardiac catheterization N/A 03/02/2015    Procedure: Intravascular Ultrasound/IVUS;  Surgeon: Burnell Blanks, MD;  Location: [facility];  Service: Cardiovascular;  Laterality: N/A;  . Coronary artery bypass graft N/A 03/10/2015    Procedure: CORONARY ARTERY BYPASS GRAFTING (CABG) x four  , using left internal mammary artery and bilateral greater saphenous veins harvested endoscopically;  Surgeon: Ivin Poot, MD;  Location: [facility];  Service: Open Heart Surgery;  Laterality: N/A;  . Tee without cardioversion N/A 03/10/2015    Procedure: TRANSESOPHAGEAL ECHOCARDIOGRAM (TEE);  Surgeon: Ivin Poot, MD;  Location: [facility];  Service: Open Heart Surgery;  Laterality: N/A;    Family History  Problem Relation Age of Onset  . Liver disease Mother   . Gallbladder disease Father   . Heart attack Sister   . Bone cancer Sister     Social History Social History  Substance Use Topics  . Smoking status: Former Smoker -- 1.00 packs/day for 25 years    Types: Cigarettes    Start date: 01/04/1955    Quit date: 01/03/1970  . Smokeless tobacco: Never Used  . Alcohol Use: No    Current Outpatient Prescriptions  Medication Sig Dispense Refill  . acetaminophen (TYLENOL) 325 MG tablet Take 2 tablets (650 mg total) by mouth every 4 (four) hours as needed for headache or mild pain.    Marland Kitchen aspirin EC 325 MG EC tablet Take 1 tablet (325 mg total) by mouth daily. 30 tablet 0  . Cholecalciferol (VITAMIN D3) 2000 UNITS TABS Take 2,000 Units by mouth  daily.     . clonazePAM (KLONOPIN) 1 MG tablet Take 1 mg by mouth daily.     . clotrimazole-betamethasone (LOTRISONE) cream Apply 1 application topically as needed (for skin).     . colchicine 0.6 MG tablet Take 0.6 mg by mouth daily as needed (for gout).     . ferrous Q000111Q C-folic acid (TRINSICON / FOLTRIN) capsule Take 1 capsule by mouth 3 (three) times daily after meals. (Patient taking differently: Take 1 capsule by mouth daily. ) 90 capsule 1  . glimepiride (AMARYL) 2 MG tablet Take 2 mg by mouth 2 (two) times daily.      . Lactobacillus  (ACIDOPHILUS) CAPS Take 1 capsule by mouth daily as needed (for immune system).     Marland Kitchen lovastatin (MEVACOR) 20 MG tablet Take 1 tablet (20 mg total) by mouth at bedtime. 90 tablet 3  . metroNIDAZOLE (METROGEL) 1 % gel Apply 1 application topically daily.      . midodrine (PROAMATINE) 10 MG tablet Take 1 tablet (10 mg total) by mouth 3 (three) times daily with meals. 90 tablet 1  . nitroGLYCERIN (NITROSTAT) 0.4 MG SL tablet Place 1 tablet (0.4 mg total) under the tongue every 5 (five) minutes as needed. (Patient taking differently: Place 0.4 mg under the tongue every 5 (five) minutes as needed for chest pain. ) 25 tablet 3  . sodium polystyrene (KAYEXALATE) 15 GM/60ML suspension Take 30 g by mouth every 30 (thirty) days.     . traMADol (ULTRAM) 50 MG tablet Take 1-2 tablets (50-100 mg total) by mouth every 4 (four) hours as needed for moderate pain. 30 tablet 0   No current facility-administered medications for this visit.    No Known Allergies  Review of Systems  Gen. improved  BP 133/72 mmHg  Pulse 79  Resp 20  Ht 5\' 10"  (1.778 m)  Wt 204 lb (92.534 kg)  BMI 29.27 kg/m2  SpO2 97% Physical Exam Alert and comfortable Lungs clear Sternum well-healed Heart rhythm regular murmur or gallop Leg incision well-healed Small nickel-sized seroma below the right knee  Diagnostic Tests: Chest x-ray clear  Impression: Excellent early recovery after CABG We discussed increasing his activity levels to include driving, lifting up to 15 pounds, avoiding strenuous yard work at this time  Plan: Return in 2 months for monitoring of progress and to evaluate the right leg postop seroma  Len Childs, MD [facility] [phone]

## 2015-04-22 NOTE — Telephone Encounter (Signed)
Refill request received for furosemide but denied since patient reported that he was not taking this medication on 04/08/15.

## 2015-04-27 ENCOUNTER — Telehealth: Payer: Self-pay | Admitting: *Deleted

## 2015-04-27 MED ORDER — FUROSEMIDE 20 MG PO TABS: 20.0000 mg | ORAL_TABLET | Freq: Every day | ORAL | Status: DC | PRN

## 2015-04-27 NOTE — Telephone Encounter (Signed)
Patient reported during last office visit that he didn't use furosemide 40 mg anymore and is now requesting a refill on furosemide. Please advise if furosemide is okay to add back to regimen.

## 2015-04-27 NOTE — Telephone Encounter (Signed)
20 mg prn leg/ankle swelling.

## 2015-05-06 DIAGNOSIS — D509 Iron deficiency anemia, unspecified: Secondary | ICD-10-CM | POA: Diagnosis not present

## 2015-05-06 DIAGNOSIS — Z79899 Other long term (current) drug therapy: Secondary | ICD-10-CM | POA: Diagnosis not present

## 2015-05-06 DIAGNOSIS — I10 Essential (primary) hypertension: Secondary | ICD-10-CM | POA: Diagnosis not present

## 2015-05-06 DIAGNOSIS — N183 Chronic kidney disease, stage 3 (moderate): Secondary | ICD-10-CM | POA: Diagnosis not present

## 2015-05-06 DIAGNOSIS — E559 Vitamin D deficiency, unspecified: Secondary | ICD-10-CM | POA: Diagnosis not present

## 2015-05-06 DIAGNOSIS — R809 Proteinuria, unspecified: Secondary | ICD-10-CM | POA: Diagnosis not present

## 2015-05-07 ENCOUNTER — Telehealth: Payer: Self-pay

## 2015-05-07 DIAGNOSIS — R200 Anesthesia of skin: Secondary | ICD-10-CM

## 2015-05-07 DIAGNOSIS — R202 Paresthesia of skin: Secondary | ICD-10-CM

## 2015-05-07 NOTE — Telephone Encounter (Signed)
Pt. called.  Reported he is having "episodes of feet and legs going to sleep, after has been sitting awhile."  Reported he is "able to shake it off fairly quickly."  Reported that he is having some unsteadiness when initially walking, after the legs and feet have gone to sleep.  Stated that his legs feel "wobbly or unbalanced."  Is questioning if this could be related to his Carotid Artery Disease?  Denied any neurological symptoms; ie: denied facial droop, unilateral weakness of extremities, slurred speech or difficulty with speech, or any temporary visual disturbance.  Pt denied any pain in lower extremities with activity or at rest.  Related hx of CABG 03/10/15, and stated he is trying to walk frequently.  Reassured pt. That the feet and legs going to sleep are unlikely related to his carotid artery disease. Requested to move his appt. To an earlier date, if possible, so he can get evaluated sooner than June 1st. Advised will place on cancellation list.  Advised to call office if having any of the neurological symptoms that were discussed.  Verb. Understanding.

## 2015-05-08 NOTE — Telephone Encounter (Signed)
Discussed pt's complaints with Dr. Oneida Alar.  Rec'd verbal order to schedule for ABI's, in addition to Carotid Duplex at appt. 5/31

## 2015-05-08 NOTE — Telephone Encounter (Signed)
sched ABI 05/11/15 at 9:00. Lm on cell# to inform pt.

## 2015-05-11 ENCOUNTER — Ambulatory Visit (HOSPITAL_COMMUNITY)
Admission: RE | Admit: 2015-05-11 | Discharge: 2015-05-11 | Disposition: A | Discharge status: Home / Self Care | Payer: Medicare HMO | Source: Ambulatory Visit | Attending: Vascular Surgery | Admitting: Vascular Surgery

## 2015-05-11 DIAGNOSIS — E1122 Type 2 diabetes mellitus with diabetic chronic kidney disease: Secondary | ICD-10-CM | POA: Diagnosis not present

## 2015-05-11 DIAGNOSIS — I129 Hypertensive chronic kidney disease with stage 1 through stage 4 chronic kidney disease, or unspecified chronic kidney disease: Secondary | ICD-10-CM | POA: Diagnosis not present

## 2015-05-11 DIAGNOSIS — Z720 Tobacco use: Secondary | ICD-10-CM | POA: Insufficient documentation

## 2015-05-11 DIAGNOSIS — N189 Chronic kidney disease, unspecified: Secondary | ICD-10-CM | POA: Insufficient documentation

## 2015-05-11 DIAGNOSIS — R200 Anesthesia of skin: Secondary | ICD-10-CM

## 2015-05-11 DIAGNOSIS — R208 Other disturbances of skin sensation: Secondary | ICD-10-CM | POA: Diagnosis not present

## 2015-05-11 DIAGNOSIS — E875 Hyperkalemia: Secondary | ICD-10-CM | POA: Diagnosis not present

## 2015-05-11 DIAGNOSIS — E785 Hyperlipidemia, unspecified: Secondary | ICD-10-CM | POA: Diagnosis not present

## 2015-05-11 DIAGNOSIS — R202 Paresthesia of skin: Secondary | ICD-10-CM | POA: Diagnosis not present

## 2015-05-11 DIAGNOSIS — I2510 Atherosclerotic heart disease of native coronary artery without angina pectoris: Secondary | ICD-10-CM | POA: Insufficient documentation

## 2015-05-20 DIAGNOSIS — I10 Essential (primary) hypertension: Secondary | ICD-10-CM | POA: Diagnosis not present

## 2015-05-20 DIAGNOSIS — H6123 Impacted cerumen, bilateral: Secondary | ICD-10-CM | POA: Diagnosis not present

## 2015-05-20 DIAGNOSIS — I25810 Atherosclerosis of coronary artery bypass graft(s) without angina pectoris: Secondary | ICD-10-CM | POA: Diagnosis not present

## 2015-05-20 DIAGNOSIS — E7800 Pure hypercholesterolemia, unspecified: Secondary | ICD-10-CM | POA: Diagnosis not present

## 2015-05-20 DIAGNOSIS — E1142 Type 2 diabetes mellitus with diabetic polyneuropathy: Secondary | ICD-10-CM | POA: Diagnosis not present

## 2015-05-27 ENCOUNTER — Encounter: Payer: Self-pay | Admitting: Vascular Surgery

## 2015-06-03 ENCOUNTER — Ambulatory Visit (HOSPITAL_COMMUNITY)
Admission: RE | Admit: 2015-06-03 | Discharge: 2015-06-03 | Disposition: A | Discharge status: Home / Self Care | Payer: Medicare HMO | Source: Ambulatory Visit | Attending: Vascular Surgery | Admitting: Vascular Surgery

## 2015-06-03 DIAGNOSIS — Z720 Tobacco use: Secondary | ICD-10-CM | POA: Diagnosis not present

## 2015-06-03 DIAGNOSIS — E1122 Type 2 diabetes mellitus with diabetic chronic kidney disease: Secondary | ICD-10-CM | POA: Insufficient documentation

## 2015-06-03 DIAGNOSIS — I129 Hypertensive chronic kidney disease with stage 1 through stage 4 chronic kidney disease, or unspecified chronic kidney disease: Secondary | ICD-10-CM | POA: Insufficient documentation

## 2015-06-03 DIAGNOSIS — I6523 Occlusion and stenosis of bilateral carotid arteries: Secondary | ICD-10-CM | POA: Diagnosis not present

## 2015-06-03 DIAGNOSIS — E785 Hyperlipidemia, unspecified: Secondary | ICD-10-CM | POA: Diagnosis not present

## 2015-06-03 DIAGNOSIS — I2510 Atherosclerotic heart disease of native coronary artery without angina pectoris: Secondary | ICD-10-CM | POA: Insufficient documentation

## 2015-06-03 DIAGNOSIS — N189 Chronic kidney disease, unspecified: Secondary | ICD-10-CM | POA: Insufficient documentation

## 2015-06-04 ENCOUNTER — Other Ambulatory Visit: Payer: Self-pay

## 2015-06-04 ENCOUNTER — Encounter: Payer: Self-pay | Admitting: Vascular Surgery

## 2015-06-04 ENCOUNTER — Ambulatory Visit (INDEPENDENT_AMBULATORY_CARE_PROVIDER_SITE_OTHER): Payer: Medicare HMO | Admitting: Vascular Surgery

## 2015-06-04 VITALS — BP 106/64 | HR 86 | Temp 98.0°F | Resp 16 | Ht 71.0 in | Wt 211.4 lb

## 2015-06-04 DIAGNOSIS — I6521 Occlusion and stenosis of right carotid artery: Secondary | ICD-10-CM

## 2015-06-04 NOTE — Progress Notes (Signed)
[facility]  Patient name: [name] [name] MRN: [id number] DOB: 01/06/1940 Sex: male  REASON FOR VISIT: follow-up  HPI: [name] [name] is a 75 y.o. 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 He was last seen by Dr. Oneida Alar in the hospital prior to his CABG on 03/10/2015. He was originally scheduled for an elective carotid endarterectomy but given a positive stress test this was canceled. He deferred cardiac cath due to concerns of his renal function. He underwent catheterization on 03/02/15 which showed multivessel disease and was then scheduled for a CABG with Dr. [name].   The patient is doing well following his CABG. The patient denies any episodes of amaurosis fugax or hemiparesis. He does describe an episode where his toes went numb a couple months ago. He resumed his Imdur and his symptoms resolved. His cardiologist is Dr. Bronson Ing who he will see him soon.  He takes 2 baby aspirin daily. He has a past medical history of hyperlipidemia managed on a statin. His diabetes is stable on oral hypoglycemics. He recently saw a nephrologist for his chronic kidney disease. His renal function is stable. His hypertension is stable. He is a former smoker.  Past Medical History  Diagnosis Date  . Coronary artery disease     quiescent on medical therapy,abnormal exercise Cardiolite suggestive of apical ischemia;EF 54%,02/10  . Hyperlipidemia     abnormal LFT's,resolved,secondary to lipitor  . Chronic kidney disease     chronic renal insufficiency,single functional kidney  . Tobacco abuse   . Penile cancer ([place])     history of penile cancer/squamous cell  . Hypertension   . Shortness of breath dyspnea     with exertion  . Diabetes mellitus without complication (HCC)     Type 2  . Arthritis   . Gout   . Abnormal nuclear stress test     Family History  Problem Relation Age of Onset  .  Liver disease Mother   . Gallbladder disease Father   . Heart attack Sister   . Bone cancer Sister     SOCIAL HISTORY: Social History  Substance Use Topics  . Smoking status: Former Smoker -- 1.00 packs/day for 25 years    Types: Cigarettes    Start date: 01/04/1955    Quit date: 01/03/1970  . Smokeless tobacco: Never Used  . Alcohol Use: No    No Known Allergies  Current Outpatient Prescriptions  Medication Sig Dispense Refill  . aspirin 81 MG tablet Take 81 mg by mouth 2 (two) times daily.    . Cholecalciferol (VITAMIN D3) 2000 UNITS TABS Take 2,000 Units by mouth daily.     . clonazePAM (KLONOPIN) 1 MG tablet Take 1 mg by mouth daily.     . clotrimazole-betamethasone (LOTRISONE) cream Apply 1 application topically as needed (for skin).     Marland Kitchen glimepiride (AMARYL) 2 MG tablet Take 2 mg by mouth 2 (two) times daily.      . Isosorbide Mononitrate (IMDUR PO) Take by mouth daily.    . Lactobacillus (ACIDOPHILUS) CAPS Take 1 capsule by mouth daily as needed (for immune system).     Marland Kitchen lovastatin (MEVACOR) 20 MG tablet Take 1 tablet (20 mg total) by mouth at bedtime. 90 tablet 3  . metroNIDAZOLE (METROGEL) 1 % gel Apply 1 application topically daily.      . Multiple Vitamins-Minerals (MULTIVITAMIN ADULT PO) Take by mouth daily.    Marland Kitchen  nitroGLYCERIN (NITROSTAT) 0.4 MG SL tablet Place 1 tablet (0.4 mg total) under the tongue every 5 (five) minutes as needed. (Patient taking differently: Place 0.4 mg under the tongue every 5 (five) minutes as needed for chest pain. ) 25 tablet 3  . sodium polystyrene (KAYEXALATE) 15 GM/60ML suspension Take 30 g by mouth every 30 (thirty) days.     Marland Kitchen acetaminophen (TYLENOL) 325 MG tablet Take 2 tablets (650 mg total) by mouth every 4 (four) hours as needed for headache or mild pain. (Patient not taking: Reported on 06/04/2015)    . aspirin EC 325 MG EC tablet Take 1 tablet (325 mg total) by mouth daily. (Patient not taking: Reported on 06/04/2015) 30 tablet 0  .  colchicine 0.6 MG tablet Take 0.6 mg by mouth daily as needed (for gout). Reported on 06/04/2015    . ferrous [id number] C-folic acid (TRINSICON / FOLTRIN) capsule Take 1 capsule by mouth 3 (three) times daily after meals. (Patient not taking: Reported on 06/04/2015) 90 capsule 1  . furosemide (LASIX) 20 MG tablet Take 1 tablet (20 mg total) by mouth daily as needed. For leg and ankle swelling (Patient not taking: Reported on 06/04/2015) 30 tablet 3  . midodrine (PROAMATINE) 10 MG tablet Take 1 tablet (10 mg total) by mouth 3 (three) times daily with meals. (Patient not taking: Reported on 06/04/2015) 90 tablet 1  . traMADol (ULTRAM) 50 MG tablet Take 1-2 tablets (50-100 mg total) by mouth every 4 (four) hours as needed for moderate pain. (Patient not taking: Reported on 06/04/2015) 30 tablet 0   No current facility-administered medications for this visit.    REVIEW OF SYSTEMS:  [X]  denotes positive finding, [ ]  denotes negative finding Cardiac  Comments:  Chest pain or chest pressure:    Shortness of breath upon exertion:    Short of breath when lying flat:    Irregular heart rhythm:        Vascular    Pain in calf, thigh, or hip brought on by ambulation:    Pain in feet at night that wakes you up from your sleep:     Blood clot in your veins:    Leg swelling:         Pulmonary    Oxygen at home:    Productive cough:     Wheezing:         Neurologic    Sudden weakness in arms or legs:     Sudden numbness in arms or legs:  x In toes, resolved with Imdur  Sudden onset of difficulty speaking or slurred speech:    Temporary loss of vision in one eye:     Problems with dizziness:         Gastrointestinal    Blood in stool:     Vomited blood:         Genitourinary    Burning when urinating:     Blood in urine:        Psychiatric    Major depression:         Hematologic    Bleeding problems:    Problems with blood clotting too easily:        Skin    Rashes or ulcers:         Constitutional    Fever or chills:      PHYSICAL EXAM: Filed Vitals:   06/04/15 1311  BP: 106/64  Pulse: 86  Temp: 98 F (36.7 C)  TempSrc: Oral  Resp: 16  Height: 5\' 11"  (1.803 m)  Weight: 211 lb 6.4 oz (95.89 kg)  SpO2: 97%    GENERAL: The patient is a well-nourished male, in no acute distress. The vital signs are documented above. CARDIAC: There is a regular rate and rhythm. No carotid bruits. Sternotomy incision has healed. VASCULAR: 2+ radial, femoral and dorsalis pedis pulses bilaterally. PULMONARY: There is good air exchange bilaterally without wheezing or rales. MUSCULOSKELETAL: There are no major deformities or cyanosis. NEUROLOGIC: No focal weakness or paresthesias are detected. SKIN: There are no ulcers or rashes noted. PSYCHIATRIC: The patient has a normal affect.  DATA:  Carotid duplex 06/03/2015  80-99% right proximal internal carotid artery stenosis Less than 40% left proximal internal carotid artery stenosis  Vertebral flow is antegrade bilaterally.  ABIs 05/11/2015 Right: 1.26 with triphasic PT and DP waveforms Left: 1.12 with triphasic DP and PT waveforms  MEDICAL ISSUES: Asymptomatic 80-99% right internal carotid artery stenosis Status post CABG 03/10/2015  The patient is doing well following his CABG. He will be scheduled for an elective right carotid endarterectomy on 06/08/2015 with Dr. Oneida Alar. He will continue aspirin.  Patient states that he had an episode of toe numbness a couple months ago that resolved with Imdur. He is to follow-up with his cardiologist Dr. Bronson Ing soon.   His noninvasive studies and clinical exam revealed normal lower extremity arterial function.  Virgina Jock, PA-C [facility]   History and exam details as above. Patient has had a known ascending to medical high-grade right internal carotid artery stenosis for some time. He initially refused cardiac intervention despite a  positive stress test. He has now had coronary revascularization. He is also now prepared to have right carotid endarterectomy. He remains asymptomatic from this standpoint. He did complain of some problems with his legs recently. Recent ABIs were completely normal suggesting that his leg symptoms were not related to peripheral arterial disease. He also had palpable pedal pulses on exam today. On neuro exam he has symmetric upper extremity and lower extremity motor strength which is 5 over 5. He has no facial asymmetry.  Risks benefits possible complications and procedure details related to right carotid endarterectomy were discussed with the patient today. These include but are not limited to bleeding infection stroke risk cranial nerve injury risk he understands and agrees to proceed. This is scheduled for Monday June 08 2015  [name], MD [facility] [place] Office: [phone] Pager: [phone]

## 2015-06-05 ENCOUNTER — Encounter (HOSPITAL_COMMUNITY): Payer: Self-pay | Admitting: *Deleted

## 2015-06-05 NOTE — Progress Notes (Addendum)
PCP is Dr Fernanda Drum, Cardiologist is Dr Weldon Picking, Nephrologist is Dr Lowanda Foster.  I instructed patient to check CBG to check CBG and if it is less than 70 to treat it with Glucose Gel, Glucose tablets or 1/2 cup of clear juice like apple juice or cranberry juice, or 1/2 cup of regular soda. (not cream soda). I instructed patient to recheck CBG in 15 minutes and if CBG is not greater than 70, to  Call [phone] (pre- op). If it is before pre-op opens to retreat as before and recheck CBG in 15 minutes. I told patient to make note of time that liquid is taken and amount, that surgical time may have to be adjusted. ,

## 2015-06-07 MED ORDER — DEXTROSE 5 % IV SOLN
1.5000 g | INTRAVENOUS | Status: AC
  Administered 2015-06-08: 1.5 g via INTRAVENOUS
  Filled 2015-06-07: qty 1.5

## 2015-06-07 NOTE — Anesthesia Preprocedure Evaluation (Addendum)
Anesthesia Evaluation  Patient identified by MRN, date of birth, ID band Patient awake    Reviewed: Allergy & Precautions, H&P , NPO status , Patient's Chart, lab work & pertinent test results  Airway Mallampati: II  TM Distance: >3 FB Neck ROM: Full    Dental no notable dental hx. (+) Teeth Intact, Dental Advisory Given   Pulmonary neg pulmonary ROS, former smoker,    Pulmonary exam normal breath sounds clear to auscultation       Cardiovascular hypertension, Pt. on medications + CAD, + CABG and + Peripheral Vascular Disease   Rhythm:Regular Rate:Normal     Neuro/Psych negative neurological ROS  negative psych ROS   GI/Hepatic negative GI ROS, Neg liver ROS,   Endo/Other  diabetes, Type 2, Oral Hypoglycemic Agents  Renal/GU Renal InsufficiencyRenal disease  negative genitourinary   Musculoskeletal  (+) Arthritis , Osteoarthritis,    Abdominal   Peds  Hematology negative hematology ROS (+)   Anesthesia Other Findings   Reproductive/Obstetrics negative OB ROS                            Anesthesia Physical Anesthesia Plan  ASA: III  Anesthesia Plan: General   Post-op Pain Management:    Induction: Intravenous  Airway Management Planned: Oral ETT  Additional Equipment: Arterial line  Intra-op Plan:   Post-operative Plan: Extubation in OR and Possible Post-op intubation/ventilation  Informed Consent: I have reviewed the patients History and Physical, chart, labs and discussed the procedure including the risks, benefits and alternatives for the proposed anesthesia with the patient or authorized representative who has indicated his/her understanding and acceptance.   Dental advisory given  Plan Discussed with: CRNA  Anesthesia Plan Comments:         Anesthesia Quick Evaluation

## 2015-06-08 ENCOUNTER — Encounter (HOSPITAL_COMMUNITY)
Admission: RE | Disposition: A | Discharge status: Home / Self Care | Payer: Self-pay | Source: Ambulatory Visit | Attending: Vascular Surgery

## 2015-06-08 ENCOUNTER — Inpatient Hospital Stay (HOSPITAL_COMMUNITY): Payer: Medicare HMO

## 2015-06-08 ENCOUNTER — Inpatient Hospital Stay (HOSPITAL_COMMUNITY)
Admission: RE | Admit: 2015-06-08 | Discharge: 2015-06-11 | DRG: 037 | Disposition: A | Discharge status: Home / Self Care | Payer: Medicare HMO | Source: Ambulatory Visit | Attending: Vascular Surgery | Admitting: Vascular Surgery

## 2015-06-08 ENCOUNTER — Inpatient Hospital Stay (HOSPITAL_COMMUNITY): Payer: Medicare HMO | Admitting: Anesthesiology

## 2015-06-08 ENCOUNTER — Encounter (HOSPITAL_COMMUNITY): Payer: Self-pay | Admitting: Anesthesiology

## 2015-06-08 DIAGNOSIS — R29898 Other symptoms and signs involving the musculoskeletal system: Secondary | ICD-10-CM | POA: Diagnosis not present

## 2015-06-08 DIAGNOSIS — R202 Paresthesia of skin: Secondary | ICD-10-CM | POA: Diagnosis not present

## 2015-06-08 DIAGNOSIS — I6521 Occlusion and stenosis of right carotid artery: Secondary | ICD-10-CM | POA: Diagnosis not present

## 2015-06-08 DIAGNOSIS — I63411 Cerebral infarction due to embolism of right middle cerebral artery: Secondary | ICD-10-CM | POA: Diagnosis not present

## 2015-06-08 DIAGNOSIS — E1122 Type 2 diabetes mellitus with diabetic chronic kidney disease: Secondary | ICD-10-CM | POA: Diagnosis present

## 2015-06-08 DIAGNOSIS — N184 Chronic kidney disease, stage 4 (severe): Secondary | ICD-10-CM | POA: Diagnosis present

## 2015-06-08 DIAGNOSIS — Z905 Acquired absence of kidney: Secondary | ICD-10-CM | POA: Diagnosis not present

## 2015-06-08 DIAGNOSIS — I2510 Atherosclerotic heart disease of native coronary artery without angina pectoris: Secondary | ICD-10-CM | POA: Diagnosis present

## 2015-06-08 DIAGNOSIS — Z85828 Personal history of other malignant neoplasm of skin: Secondary | ICD-10-CM | POA: Diagnosis not present

## 2015-06-08 DIAGNOSIS — Z7982 Long term (current) use of aspirin: Secondary | ICD-10-CM

## 2015-06-08 DIAGNOSIS — R338 Other retention of urine: Secondary | ICD-10-CM | POA: Diagnosis not present

## 2015-06-08 DIAGNOSIS — R29702 NIHSS score 2: Secondary | ICD-10-CM | POA: Diagnosis not present

## 2015-06-08 DIAGNOSIS — Z8249 Family history of ischemic heart disease and other diseases of the circulatory system: Secondary | ICD-10-CM | POA: Diagnosis not present

## 2015-06-08 DIAGNOSIS — R339 Retention of urine, unspecified: Secondary | ICD-10-CM | POA: Diagnosis present

## 2015-06-08 DIAGNOSIS — R29818 Other symptoms and signs involving the nervous system: Secondary | ICD-10-CM | POA: Diagnosis not present

## 2015-06-08 DIAGNOSIS — Z8549 Personal history of malignant neoplasm of other male genital organs: Secondary | ICD-10-CM | POA: Diagnosis not present

## 2015-06-08 DIAGNOSIS — I639 Cerebral infarction, unspecified: Secondary | ICD-10-CM

## 2015-06-08 DIAGNOSIS — I129 Hypertensive chronic kidney disease with stage 1 through stage 4 chronic kidney disease, or unspecified chronic kidney disease: Secondary | ICD-10-CM | POA: Diagnosis not present

## 2015-06-08 DIAGNOSIS — Z951 Presence of aortocoronary bypass graft: Secondary | ICD-10-CM

## 2015-06-08 DIAGNOSIS — Z808 Family history of malignant neoplasm of other organs or systems: Secondary | ICD-10-CM | POA: Diagnosis not present

## 2015-06-08 DIAGNOSIS — E1151 Type 2 diabetes mellitus with diabetic peripheral angiopathy without gangrene: Secondary | ICD-10-CM | POA: Diagnosis not present

## 2015-06-08 DIAGNOSIS — I959 Hypotension, unspecified: Secondary | ICD-10-CM | POA: Diagnosis not present

## 2015-06-08 DIAGNOSIS — I6350 Cerebral infarction due to unspecified occlusion or stenosis of unspecified cerebral artery: Secondary | ICD-10-CM | POA: Diagnosis not present

## 2015-06-08 DIAGNOSIS — E785 Hyperlipidemia, unspecified: Secondary | ICD-10-CM | POA: Diagnosis present

## 2015-06-08 DIAGNOSIS — Q600 Renal agenesis, unilateral: Secondary | ICD-10-CM | POA: Diagnosis not present

## 2015-06-08 DIAGNOSIS — G8324 Monoplegia of upper limb affecting left nondominant side: Secondary | ICD-10-CM | POA: Diagnosis not present

## 2015-06-08 DIAGNOSIS — I6529 Occlusion and stenosis of unspecified carotid artery: Secondary | ICD-10-CM | POA: Diagnosis present

## 2015-06-08 DIAGNOSIS — Z7984 Long term (current) use of oral hypoglycemic drugs: Secondary | ICD-10-CM | POA: Diagnosis not present

## 2015-06-08 DIAGNOSIS — Z87891 Personal history of nicotine dependence: Secondary | ICD-10-CM | POA: Diagnosis not present

## 2015-06-08 DIAGNOSIS — Z9889 Other specified postprocedural states: Secondary | ICD-10-CM | POA: Diagnosis not present

## 2015-06-08 DIAGNOSIS — I739 Peripheral vascular disease, unspecified: Secondary | ICD-10-CM | POA: Diagnosis not present

## 2015-06-08 HISTORY — PX: ENDARTERECTOMY: SHX5162

## 2015-06-08 HISTORY — DX: Peripheral vascular disease, unspecified: I73.9

## 2015-06-08 LAB — COMPREHENSIVE METABOLIC PANEL
ALK PHOS: 70 U/L (ref 38–126)
ALT: 19 U/L (ref 17–63)
ANION GAP: 7 (ref 5–15)
AST: 18 U/L (ref 15–41)
Albumin: 3.4 g/dL — ABNORMAL LOW (ref 3.5–5.0)
BILIRUBIN TOTAL: 0.4 mg/dL (ref 0.3–1.2)
BUN: 44 mg/dL — ABNORMAL HIGH (ref 6–20)
CALCIUM: 9.3 mg/dL (ref 8.9–10.3)
CO2: 23 mmol/L (ref 22–32)
CREATININE: 2.61 mg/dL — AB (ref 0.61–1.24)
Chloride: 109 mmol/L (ref 101–111)
GFR, EST AFRICAN AMERICAN: 26 mL/min — AB (ref >60)
GFR, EST NON AFRICAN AMERICAN: 22 mL/min — AB (ref >60)
Glucose, Bld: 169 mg/dL — ABNORMAL HIGH (ref 65–99)
Potassium: 5.1 mmol/L (ref 3.5–5.1)
SODIUM: 139 mmol/L (ref 135–145)
TOTAL PROTEIN: 6 g/dL — AB (ref 6.5–8.1)

## 2015-06-08 LAB — TYPE AND SCREEN
ABO/RH(D): O POS
Antibody Screen: NEGATIVE

## 2015-06-08 LAB — PROTIME-INR
INR: 1.14 (ref 0.00–1.49)
PROTHROMBIN TIME: 14.8 s (ref 11.6–15.2)

## 2015-06-08 LAB — CBC
HCT: 37.1 % — ABNORMAL LOW (ref 39.0–52.0)
HEMOGLOBIN: 11.6 g/dL — AB (ref 13.0–17.0)
MCH: 29.7 pg (ref 26.0–34.0)
MCHC: 31.3 g/dL (ref 30.0–36.0)
MCV: 94.9 fL (ref 78.0–100.0)
Platelets: 325 10*3/uL (ref 150–400)
RBC: 3.91 MIL/uL — AB (ref 4.22–5.81)
RDW: 13.2 % (ref 11.5–15.5)
WBC: 8.6 10*3/uL (ref 4.0–10.5)

## 2015-06-08 LAB — SURGICAL PCR SCREEN
MRSA, PCR: NEGATIVE
Staphylococcus aureus: NEGATIVE

## 2015-06-08 LAB — GLUCOSE, CAPILLARY
GLUCOSE-CAPILLARY: 169 mg/dL — AB (ref 65–99)
GLUCOSE-CAPILLARY: 164 mg/dL — AB (ref 65–99)
Glucose-Capillary: 207 mg/dL — ABNORMAL HIGH (ref 65–99)
Glucose-Capillary: 175 mg/dL — ABNORMAL HIGH (ref 65–99)

## 2015-06-08 LAB — APTT: APTT: 34 s (ref 24–37)

## 2015-06-08 SURGERY — ENDARTERECTOMY, CAROTID
Anesthesia: General | Laterality: Right

## 2015-06-08 MED ORDER — STROKE: EARLY STAGES OF RECOVERY BOOK
Freq: Once | Status: DC
  Filled 2015-06-08: qty 1

## 2015-06-08 MED ORDER — ACETAMINOPHEN 325 MG PO TABS: 325.0000 mg | ORAL_TABLET | ORAL | Status: DC | PRN

## 2015-06-08 MED ORDER — ALBUMIN HUMAN 5 % IV SOLN
INTRAVENOUS | Status: AC
  Filled 2015-06-08: qty 250

## 2015-06-08 MED ORDER — ONDANSETRON HCL 4 MG/2ML IJ SOLN
INTRAMUSCULAR | Status: DC | PRN
  Administered 2015-06-08: 4 mg via INTRAVENOUS

## 2015-06-08 MED ORDER — SODIUM CHLORIDE 0.9 % IV SOLN: 500.0000 mL | Freq: Once | INTRAVENOUS | Status: DC | PRN

## 2015-06-08 MED ORDER — DOPAMINE-DEXTROSE 3.2-5 MG/ML-% IV SOLN
0.0000 ug/kg/min | INTRAVENOUS | Status: DC
  Administered 2015-06-08: 5 ug/kg/min via INTRAVENOUS

## 2015-06-08 MED ORDER — CHLORHEXIDINE GLUCONATE CLOTH 2 % EX PADS: 6.0000 | MEDICATED_PAD | Freq: Once | CUTANEOUS | Status: DC

## 2015-06-08 MED ORDER — DOPAMINE-DEXTROSE 3.2-5 MG/ML-% IV SOLN
0.0000 ug/kg/min | INTRAVENOUS | Status: DC
  Administered 2015-06-08 (×2): 5 ug/kg/min via INTRAVENOUS
  Administered 2015-06-09: 7 ug/kg/min via INTRAVENOUS
  Administered 2015-06-09: 5 ug/kg/min via INTRAVENOUS
  Administered 2015-06-09: 7 ug/kg/min via INTRAVENOUS
  Administered 2015-06-09: 3 ug/kg/min via INTRAVENOUS
  Administered 2015-06-09: 10 ug/kg/min via INTRAVENOUS
  Administered 2015-06-09: 5 ug/kg/min via INTRAVENOUS
  Filled 2015-06-08 (×2): qty 250

## 2015-06-08 MED ORDER — FENTANYL CITRATE (PF) 100 MCG/2ML IJ SOLN
INTRAMUSCULAR | Status: DC | PRN
  Administered 2015-06-08: 50 ug via INTRAVENOUS

## 2015-06-08 MED ORDER — PROTAMINE SULFATE 10 MG/ML IV SOLN
INTRAVENOUS | Status: AC
  Filled 2015-06-08: qty 10

## 2015-06-08 MED ORDER — ONDANSETRON HCL 4 MG/2ML IJ SOLN: 4.0000 mg | Freq: Four times a day (QID) | INTRAMUSCULAR | Status: DC | PRN

## 2015-06-08 MED ORDER — LABETALOL HCL 5 MG/ML IV SOLN
INTRAVENOUS | Status: DC | PRN
  Administered 2015-06-08: 10 mg via INTRAVENOUS

## 2015-06-08 MED ORDER — SODIUM CHLORIDE 0.9 % IV SOLN
0.0125 ug/kg/min | INTRAVENOUS | Status: AC
  Administered 2015-06-08: .25 ug/kg/min via INTRAVENOUS
  Filled 2015-06-08: qty 2000

## 2015-06-08 MED ORDER — VITAMIN D 1000 UNITS PO TABS
2000.0000 [IU] | ORAL_TABLET | Freq: Every day | ORAL | Status: DC
  Administered 2015-06-08 – 2015-06-11 (×4): 2000 [IU] via ORAL
  Filled 2015-06-08 (×4): qty 2

## 2015-06-08 MED ORDER — ALBUMIN HUMAN 5 % IV SOLN
12.5000 g | Freq: Once | INTRAVENOUS | Status: AC
  Administered 2015-06-08: 12.5 g via INTRAVENOUS

## 2015-06-08 MED ORDER — NITROGLYCERIN 0.4 MG SL SUBL: 0.4000 mg | SUBLINGUAL_TABLET | SUBLINGUAL | Status: DC | PRN

## 2015-06-08 MED ORDER — FENTANYL CITRATE (PF) 250 MCG/5ML IJ SOLN
INTRAMUSCULAR | Status: AC
  Filled 2015-06-08: qty 5

## 2015-06-08 MED ORDER — ISOSORBIDE MONONITRATE ER 30 MG PO TB24
30.0000 mg | ORAL_TABLET | ORAL | Status: DC
  Administered 2015-06-10: 30 mg via ORAL
  Filled 2015-06-08 (×2): qty 1

## 2015-06-08 MED ORDER — PHENYLEPHRINE HCL 10 MG/ML IJ SOLN
10.0000 mg | INTRAVENOUS | Status: DC | PRN
  Administered 2015-06-08: 50 ug/min via INTRAVENOUS

## 2015-06-08 MED ORDER — ASPIRIN 81 MG PO TABS: 81.0000 mg | ORAL_TABLET | Freq: Two times a day (BID) | ORAL | Status: DC

## 2015-06-08 MED ORDER — DEXTROSE 5 % IV SOLN
1.5000 g | Freq: Two times a day (BID) | INTRAVENOUS | Status: AC
  Administered 2015-06-08 – 2015-06-09 (×2): 1.5 g via INTRAVENOUS
  Filled 2015-06-08 (×3): qty 1.5

## 2015-06-08 MED ORDER — GUAIFENESIN-DM 100-10 MG/5ML PO SYRP: 15.0000 mL | ORAL_SOLUTION | ORAL | Status: DC | PRN

## 2015-06-08 MED ORDER — LIDOCAINE HCL (CARDIAC) 20 MG/ML IV SOLN
INTRAVENOUS | Status: DC | PRN
  Administered 2015-06-08: 100 mg via INTRAVENOUS

## 2015-06-08 MED ORDER — PROPOFOL 10 MG/ML IV BOLUS
INTRAVENOUS | Status: AC
  Filled 2015-06-08: qty 20

## 2015-06-08 MED ORDER — ROCURONIUM BROMIDE 50 MG/5ML IV SOLN
INTRAVENOUS | Status: AC
  Filled 2015-06-08: qty 1

## 2015-06-08 MED ORDER — LABETALOL HCL 5 MG/ML IV SOLN
INTRAVENOUS | Status: AC
  Filled 2015-06-08: qty 4

## 2015-06-08 MED ORDER — PROTAMINE SULFATE 10 MG/ML IV SOLN
INTRAVENOUS | Status: DC | PRN
  Administered 2015-06-08: 100 mg via INTRAVENOUS

## 2015-06-08 MED ORDER — SODIUM CHLORIDE 0.9 % IV SOLN
INTRAVENOUS | Status: DC | PRN
  Administered 2015-06-08: 08:00:00

## 2015-06-08 MED ORDER — ONDANSETRON HCL 4 MG/2ML IJ SOLN
INTRAMUSCULAR | Status: AC
  Filled 2015-06-08: qty 2

## 2015-06-08 MED ORDER — MIDAZOLAM HCL 2 MG/2ML IJ SOLN
INTRAMUSCULAR | Status: AC
  Administered 2015-06-08: 1 mg via INTRAVENOUS
  Filled 2015-06-08: qty 2

## 2015-06-08 MED ORDER — LABETALOL HCL 5 MG/ML IV SOLN: 10.0000 mg | INTRAVENOUS | Status: DC | PRN

## 2015-06-08 MED ORDER — METOPROLOL TARTRATE 5 MG/5ML IV SOLN: 2.0000 mg | INTRAVENOUS | Status: DC | PRN

## 2015-06-08 MED ORDER — POTASSIUM CHLORIDE CRYS ER 20 MEQ PO TBCR: 20.0000 meq | EXTENDED_RELEASE_TABLET | Freq: Every day | ORAL | Status: DC | PRN

## 2015-06-08 MED ORDER — HYDROMORPHONE HCL 1 MG/ML IJ SOLN: 0.2500 mg | INTRAMUSCULAR | Status: DC | PRN

## 2015-06-08 MED ORDER — OXYCODONE-ACETAMINOPHEN 5-325 MG PO TABS: 1.0000 | ORAL_TABLET | Freq: Four times a day (QID) | ORAL | Status: DC | PRN

## 2015-06-08 MED ORDER — LIDOCAINE HCL (PF) 1 % IJ SOLN
INTRAMUSCULAR | Status: AC
  Filled 2015-06-08: qty 30

## 2015-06-08 MED ORDER — MUPIROCIN 2 % EX OINT
1.0000 "application " | TOPICAL_OINTMENT | Freq: Once | CUTANEOUS | Status: AC
  Administered 2015-06-08: 1 via TOPICAL
  Filled 2015-06-08: qty 22

## 2015-06-08 MED ORDER — METRONIDAZOLE 1 % EX GEL: 1.0000 "application " | Freq: Every day | CUTANEOUS | Status: DC

## 2015-06-08 MED ORDER — PHENOL 1.4 % MT LIQD: 1.0000 | OROMUCOSAL | Status: DC | PRN

## 2015-06-08 MED ORDER — ASPIRIN 300 MG RE SUPP
300.0000 mg | Freq: Every day | RECTAL | Status: DC
  Filled 2015-06-08: qty 1

## 2015-06-08 MED ORDER — HYDRALAZINE HCL 20 MG/ML IJ SOLN: 5.0000 mg | INTRAMUSCULAR | Status: DC | PRN

## 2015-06-08 MED ORDER — SUGAMMADEX SODIUM 200 MG/2ML IV SOLN
INTRAVENOUS | Status: DC | PRN
  Administered 2015-06-08: 200 mg via INTRAVENOUS

## 2015-06-08 MED ORDER — GLIMEPIRIDE 4 MG PO TABS
2.0000 mg | ORAL_TABLET | Freq: Two times a day (BID) | ORAL | Status: DC
  Administered 2015-06-08 – 2015-06-11 (×6): 2 mg via ORAL
  Filled 2015-06-08 (×6): qty 1

## 2015-06-08 MED ORDER — SODIUM CHLORIDE 0.9 % IV SOLN
INTRAVENOUS | Status: DC
Start: 2015-06-08 — End: 2015-06-08
  Administered 2015-06-08 (×2): via INTRAVENOUS

## 2015-06-08 MED ORDER — ROCURONIUM BROMIDE 100 MG/10ML IV SOLN
INTRAVENOUS | Status: DC | PRN
  Administered 2015-06-08: 50 mg via INTRAVENOUS

## 2015-06-08 MED ORDER — PANTOPRAZOLE SODIUM 40 MG PO TBEC
40.0000 mg | DELAYED_RELEASE_TABLET | Freq: Every day | ORAL | Status: DC
  Administered 2015-06-08 – 2015-06-11 (×4): 40 mg via ORAL
  Filled 2015-06-08 (×5): qty 1

## 2015-06-08 MED ORDER — HEPARIN SODIUM (PORCINE) 1000 UNIT/ML IJ SOLN
INTRAMUSCULAR | Status: DC | PRN
  Administered 2015-06-08: 5 mL via INTRAVENOUS
  Administered 2015-06-08: 9 mL via INTRAVENOUS

## 2015-06-08 MED ORDER — INSULIN ASPART 100 UNIT/ML ~~LOC~~ SOLN
0.0000 [IU] | Freq: Three times a day (TID) | SUBCUTANEOUS | Status: DC
  Administered 2015-06-09 (×2): 3 [IU] via SUBCUTANEOUS
  Administered 2015-06-10: 2 [IU] via SUBCUTANEOUS
  Administered 2015-06-10: 3 [IU] via SUBCUTANEOUS

## 2015-06-08 MED ORDER — MORPHINE SULFATE (PF) 2 MG/ML IV SOLN: 2.0000 mg | INTRAVENOUS | Status: DC | PRN

## 2015-06-08 MED ORDER — SENNOSIDES-DOCUSATE SODIUM 8.6-50 MG PO TABS: 1.0000 | ORAL_TABLET | Freq: Every evening | ORAL | Status: DC | PRN

## 2015-06-08 MED ORDER — ASPIRIN 325 MG PO TABS
325.0000 mg | ORAL_TABLET | Freq: Every day | ORAL | Status: DC
  Administered 2015-06-08 – 2015-06-11 (×4): 325 mg via ORAL
  Filled 2015-06-08 (×4): qty 1

## 2015-06-08 MED ORDER — PROPOFOL 10 MG/ML IV BOLUS
INTRAVENOUS | Status: DC | PRN
  Administered 2015-06-08: 150 mg via INTRAVENOUS

## 2015-06-08 MED ORDER — CLONAZEPAM 1 MG PO TABS
1.0000 mg | ORAL_TABLET | Freq: Every day | ORAL | Status: DC
  Administered 2015-06-08 – 2015-06-11 (×4): 1 mg via ORAL
  Filled 2015-06-08 (×4): qty 1

## 2015-06-08 MED ORDER — THROMBIN 20000 UNITS EX KIT
PACK | CUTANEOUS | Status: AC
  Filled 2015-06-08: qty 1

## 2015-06-08 MED ORDER — SUGAMMADEX SODIUM 200 MG/2ML IV SOLN
INTRAVENOUS | Status: AC
  Filled 2015-06-08: qty 2

## 2015-06-08 MED ORDER — METRONIDAZOLE 0.75 % EX GEL
Freq: Every day | CUTANEOUS | Status: DC
  Filled 2015-06-08: qty 45

## 2015-06-08 MED ORDER — MIDAZOLAM HCL 2 MG/2ML IJ SOLN
1.0000 mg | Freq: Once | INTRAMUSCULAR | Status: AC
  Administered 2015-06-08: 1 mg via INTRAVENOUS

## 2015-06-08 MED ORDER — PHENYLEPHRINE HCL 10 MG/ML IJ SOLN
0.0000 ug/min | INTRAVENOUS | Status: DC
  Administered 2015-06-08: 20 ug/min via INTRAVENOUS

## 2015-06-08 MED ORDER — 0.9 % SODIUM CHLORIDE (POUR BTL) OPTIME
TOPICAL | Status: DC | PRN
  Administered 2015-06-08: 1000 mL

## 2015-06-08 MED ORDER — LIDOCAINE 2% (20 MG/ML) 5 ML SYRINGE
INTRAMUSCULAR | Status: AC
  Filled 2015-06-08: qty 10

## 2015-06-08 MED ORDER — ACETAMINOPHEN 325 MG RE SUPP: 325.0000 mg | RECTAL | Status: DC | PRN

## 2015-06-08 MED ORDER — FAMOTIDINE IN NACL 20-0.9 MG/50ML-% IV SOLN
20.0000 mg | Freq: Once | INTRAVENOUS | Status: AC
  Administered 2015-06-08: 20 mg via INTRAVENOUS
  Filled 2015-06-08: qty 50

## 2015-06-08 MED ORDER — BISACODYL 10 MG RE SUPP: 10.0000 mg | Freq: Every day | RECTAL | Status: DC | PRN

## 2015-06-08 MED ORDER — FAMOTIDINE IN NACL 20-0.9 MG/50ML-% IV SOLN: 20.0000 mg | Freq: Two times a day (BID) | INTRAVENOUS | Status: DC

## 2015-06-08 MED ORDER — PRAVASTATIN SODIUM 20 MG PO TABS
20.0000 mg | ORAL_TABLET | Freq: Every day | ORAL | Status: DC
  Administered 2015-06-08 – 2015-06-10 (×3): 20 mg via ORAL
  Filled 2015-06-08 (×3): qty 1

## 2015-06-08 MED ORDER — SODIUM CHLORIDE 0.9 % IV SOLN
INTRAVENOUS | Status: DC
  Administered 2015-06-08: 18:00:00 via INTRAVENOUS

## 2015-06-08 MED ORDER — DOCUSATE SODIUM 100 MG PO CAPS
100.0000 mg | ORAL_CAPSULE | Freq: Every day | ORAL | Status: DC
  Administered 2015-06-09 – 2015-06-11 (×3): 100 mg via ORAL
  Filled 2015-06-08 (×3): qty 1

## 2015-06-08 SURGICAL SUPPLY — 39 items
CANISTER SUCTION 2500CC (MISCELLANEOUS) ×2 IMPLANT
CANNULA VESSEL 3MM 2 BLNT TIP (CANNULA) ×2 IMPLANT
CATH ROBINSON RED A/P 18FR (CATHETERS) ×2 IMPLANT
CLIP TI MEDIUM 6 (CLIP) ×2 IMPLANT
CLIP TI WIDE RED SMALL 6 (CLIP) ×2 IMPLANT
CRADLE DONUT ADULT HEAD (MISCELLANEOUS) ×2 IMPLANT
DECANTER SPIKE VIAL GLASS SM (MISCELLANEOUS) IMPLANT
DRAIN HEMOVAC 1/8 X 5 (WOUND CARE) IMPLANT
ELECT REM PT RETURN 9FT ADLT (ELECTROSURGICAL) ×2
ELECTRODE REM PT RTRN 9FT ADLT (ELECTROSURGICAL) ×1 IMPLANT
EVACUATOR SILICONE 100CC (DRAIN) IMPLANT
GAUZE SPONGE 4X4 12PLY STRL (GAUZE/BANDAGES/DRESSINGS) ×2 IMPLANT
GEL ULTRASOUND 20GR AQUASONIC (MISCELLANEOUS) IMPLANT
GLOVE BIO SURGEON STRL SZ7.5 (GLOVE) ×2 IMPLANT
GOWN STRL REUS W/ TWL LRG LVL3 (GOWN DISPOSABLE) ×3 IMPLANT
GOWN STRL REUS W/TWL LRG LVL3 (GOWN DISPOSABLE) ×3
KIT BASIN OR (CUSTOM PROCEDURE TRAY) ×2 IMPLANT
KIT ROOM TURNOVER OR (KITS) ×2 IMPLANT
LIQUID BAND (GAUZE/BANDAGES/DRESSINGS) ×2 IMPLANT
LOOP VESSEL MINI RED (MISCELLANEOUS) IMPLANT
NEEDLE HYPO 25GX1X1/2 BEV (NEEDLE) IMPLANT
NS IRRIG 1000ML POUR BTL (IV SOLUTION) ×2 IMPLANT
PACK CAROTID (CUSTOM PROCEDURE TRAY) ×2 IMPLANT
PAD ARMBOARD 7.5X6 YLW CONV (MISCELLANEOUS) ×4 IMPLANT
PATCH HEMASHIELD 8X75 (Vascular Products) ×2 IMPLANT
SHUNT CAROTID BYPASS 10 (VASCULAR PRODUCTS) ×2 IMPLANT
SHUNT CAROTID BYPASS 12FRX15.5 (VASCULAR PRODUCTS) IMPLANT
SPONGE INTESTINAL PEANUT (DISPOSABLE) ×2 IMPLANT
SPONGE SURGIFOAM ABS GEL 100 (HEMOSTASIS) IMPLANT
SUT ETHILON 3 0 PS 1 (SUTURE) IMPLANT
SUT PROLENE 6 0 CC (SUTURE) ×2 IMPLANT
SUT PROLENE 7 0 BV 1 (SUTURE) IMPLANT
SUT SILK 3 0 TIES 17X18 (SUTURE)
SUT SILK 3-0 18XBRD TIE BLK (SUTURE) IMPLANT
SUT VIC AB 3-0 SH 27 (SUTURE) ×1
SUT VIC AB 3-0 SH 27X BRD (SUTURE) ×1 IMPLANT
SUT VICRYL 4-0 PS2 18IN ABS (SUTURE) ×2 IMPLANT
SYR CONTROL 10ML LL (SYRINGE) IMPLANT
WATER STERILE IRR 1000ML POUR (IV SOLUTION) IMPLANT

## 2015-06-08 NOTE — Transfer of Care (Signed)
Immediate Anesthesia Transfer of Care Note  Patient: [name] [name]  Procedure(s) Performed: Procedure(s): ENDARTERECTOMY CAROTID (Right)  Patient Location: PACU  Anesthesia Type:General  Level of Consciousness: awake, alert , oriented and patient cooperative  Airway & Oxygen Therapy: Patient Spontanous Breathing and Patient connected to nasal cannula oxygen  Post-op Assessment: Report given to RN, Post -op Vital signs reviewed and stable and Patient moving all extremities  Post vital signs: Reviewed and stable  Last Vitals:  Filed Vitals:   06/08/15 0634 06/08/15 1041  BP: 132/57 125/70  Pulse: 76 66  Temp: 37 C 36.6 C  Resp: 16 15    Last Pain: There were no vitals filed for this visit.    Patients Stated Pain Goal: 1 ([id number])  Complications: No apparent anesthesia complications

## 2015-06-08 NOTE — Progress Notes (Addendum)
Pt in PACU had symmetric UE motor strength on arrival in PACU about 2 hours ago.  Now has 20 min history of weakness in left hand with weakness primarily with wrist extension and numbness in lateral hand.  No facial asymmetry or leg weakness or slurred speech.  Filed Vitals:   06/08/15 1126 06/08/15 1141 06/08/15 1156 06/08/15 1209  BP: 95/53 89/52 88/47  84/50  Pulse: 65 67 66 64  Temp:      Resp: 17 20 17 15   Height:      Weight:      SpO2: 100% 100% 100% 100%     Will call code stroke  Needs stat carotid duplex Followed by head CT if duplex negative  Discussed with pt  [name], MD [facility] [place] Office: [phone] Pager: [phone]

## 2015-06-08 NOTE — Interval H&P Note (Signed)
History and Physical Interval Note:  06/08/2015 7:26 AM  [name] [name]  has presented today for surgery, with the diagnosis of Right carotid artery stenosis I65.21  The various methods of treatment have been discussed with the patient and family. After consideration of risks, benefits and other options for treatment, the patient has consented to  Procedure(s): ENDARTERECTOMY CAROTID (Right) as a surgical intervention .  The patient's history has been reviewed, patient examined, no change in status, stable for surgery.  I have reviewed the patient's chart and labs.  Questions were answered to the patient's satisfaction.     [name]

## 2015-06-08 NOTE — Code Documentation (Signed)
75yo male presented today for scheduled right carotid endarterectomy for high-grade asymptomatic right internal carotid artery stenosis.  Patient underwent the procedure and went to recovery post procedure.  While in recovery patient reported that he was unable to move his fingers on the left hand.  LKW 1239.  Of note, patient with hypotension around the same time requiring treatment with Albumin and then Neo gtt per PACU RN.  Code stroke activated.  Stroke team to the bedside.  NIHSS 2, see documentation for details and code stroke times.  Patient with left arm drift and ataxia.  Patient reports his left arm "feels like it is asleep."  Patient transported to CT and back to PACU, tolerated test well.  Patient feels as if the weakness is improving as he can now "wiggle his fingers and lay his hand flat."  Patient is contraindicated for tPA d/t recent surgery.  No acute stroke treatment at this time per MD.  Patient to undergo carotid ultrasound.  Bedside handoff with PACU RN Herschel Senegal.

## 2015-06-08 NOTE — Op Note (Signed)
Procedure: Right carotid endarterectomy  Preoperative diagnosis: High-grade asymptomatic right internal carotid artery stenosis  Postoperative diagnosis: Same  Anesthesia General  Asst.: Adele Barthel MD, Leontine Locket, [facility]  Operative findings: #1 greater than 80% right internal carotid stenosis                                                             #2 Dacron patch           #3 10 Fr shunt  Operative details: After obtaining informed consent, the patient was taken to the operating room. The patient was placed in a supine position on the operating room table. After induction of general anesthesia, the patient's entire neck and chest was prepped and draped in the usual sterile fashion. An oblique incision was made on the right aspect of the patient's neck anterior to the border the right sternocleidomastoid muscle. The incision was carried into the subcutaneous tissues and through the platysma. The sternocleidomastoid muscle was identified and reflected laterally.  The common carotid artery was then found at the base of the incision this was dissected free circumferentially. It was fairly soft on palpation.  The vagus nerve was identified and protected. Dissection was then carried up to the level carotid bifurcation.   The hyperglossal nerve was crossing over the internal where there was still palpable plaque so I mobilized the nerve a fair amount to provide more exposure of the distal ICA.  Several tethering venous and arterial branches were ligated and divided between silk ties. The ansa cervicalis was divided at its insertion to the hypoglossal in order to provide exposure.  The internal carotid artery was dissected free circumferentially just above the level of the hypoglossal nerve and it was soft in character at this location and above any palpable disease. A vessel loop was placed around this. Next the external carotid and superior thyroid arteries were dissected free circumferentially and  vessel loops were placed around these. The patient was given 9000 units of intravenous heparin.  After 2 minutes of circulation time and raising the mean arterial pressure to 90 mm mercury, the distal internal carotid artery was controlled with small bulldog clamp. The external carotid and superior thyroid arteries were controlled with vessel loops. The common carotid artery was controlled with a peripheral DeBakey clamp. A longitudinal opening was made in the common carotid artery just below the bifurcation. The arteriotomy was extended distally up into the internal carotid with Potts scissors. There was a large calcified plaque with greater than 80% stenosis in the internal carotid.  A 10 Fr shunt was brought onto the field and fashioned to fit the patient's artery.  This was threaded into the distal internal carotid artery and allowed to backbleed thoroughly.  There was good but not pulsatile backbleeding.  This was then threaded into the common carotid and secured with a Rummel tourniquet.   There was no air at this point and flow was restored to the brain.  Attention was then turned to the common carotid artery once again. A suitable endarterectomy plane was obtained and endarterectomy was begun in the common carotid artery and a good proximal endpoint was obtained. An eversion endarterectomy was performed on the external carotid artery and a good endpoint was obtained. The plaque was then elevated in the internal  carotid artery and a nice feathered distal endpoint was also obtained. There was slightly lifting of the intima on the posterior wall so this was tacked with two 7 0 prolene sutures. The plaque was passed off the table. All loose debris was then removed from the carotid bed and everything was thoroughly irrigated with heparinized saline. A Dacron patch was then brought on to the operative field and this was sewn on as a patch angioplasty using a running 6-0 Prolene suture. Prior to completion of the  anastomosis the internal carotid artery was thoroughly backbled. This was then controlled again with a fine bulldog clamp.  The common carotid was thoroughly flushed forward and controlled with a peripheral Debakey clamp.  The external carotid was also thoroughly backbled.  The remainder of the patch was completed and the anastomosis was secured. Flow was then restored first retrograde from the external carotid into the carotid bed then antegrade from the common carotid to the external carotid artery and after approximately 5 cardiac cycles to the internal carotid artery. Doppler was used to evaluate the external/internal and common carotid arteries and these all had good Doppler flow. Hemostasis was obtained with 2 additional repair sutures. The patient was also given 100 mg of Protamine. He had been given an additional 5000 units of heparin during the case.    The platysma muscle was reapproximated using a running 3-0 Vicryl suture. The skin was closed with 4 0 Vicryl subcuticular stitch.  The patient was awakened in the operating room and was moving upper and lower extremities symmetrically and following commands.  The patient was stable on arrival to the PACU.  [name], MD [facility] [place] Office: [phone] Pager: [phone]

## 2015-06-08 NOTE — Progress Notes (Signed)
While assessing BP patient stated "I can not move my fingers on the left hand". Last assessment at 12:15. Symptoms started at 12:40pm. Dr Jefm Petty was at bedside. Completed neuro assessment. Pupils reactive, face symmetrical, R hand grip strong. No grip with L hand. Left arm was moving but seemed difficult for patient to control.  Pulses strong in L and R arm. Patient A&A x4. MD notified Dr. Oneida Alar.  BP 113/50 HR 63 O2 100% on 2L nasal cannula

## 2015-06-08 NOTE — Progress Notes (Signed)
Pt still with some weakness in his left hand but says he feels like it is trying to wake up BP over 100 on dopamine Right neck no hematoma Tongue midline  Duplex no thrombosis Head CT negative  Hopefully symptoms will resolve overnight Keep SBP > 100  [name], MD [facility] [place] Office: [phone] Pager: [phone]

## 2015-06-08 NOTE — Consult Note (Addendum)
5:13 PM   [name] [name] 06-Sep-1940 [id number]  Referring provider: Dr. Pershing Proud  CC: Urinary retention   HPI: The patient is a 75 year old gentleman with a past medical history of a penectomy in 1986 who underwent a carotid endarterectomy and developed urinary retention. Due to his history of penectomy, the nursing staff was unable to place a Foley catheter. The urology service was requested for assistance when the patient had 980 cc in his bladder. The patient also notes a single functioning kidney. 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   PMH: Past Medical History  Diagnosis Date  . Coronary artery disease     quiescent on medical therapy,abnormal exercise Cardiolite suggestive of apical ischemia;EF 54%,02/10  . Hyperlipidemia     abnormal LFT's,resolved,secondary to lipitor  . Chronic kidney disease     chronic renal insufficiency,single functional kidney  . Tobacco abuse   . 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 Hypertension   . Shortness of breath dyspnea     with exertion  . Diabetes mellitus without complication (HCC)     Type 2  . Arthritis   . Gout   . Abnormal nuclear stress test   . Peripheral vascular disease ([place])     carotid artery blockage    Surgical History: Past Surgical History  Procedure Laterality Date  . Bladder surgery  1980  . Penectomy      surgery for carcinoma of penis  . 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 Cardiac catheterization N/A 03/02/2015    Procedure: Left Heart Cath and Coronary Angiography;  Surgeon: Burnell Blanks, MD;  Location: [facility];  Service: Cardiovascular;  Laterality: N/A;  . Cardiac catheterization N/A 03/02/2015    Procedure: Intravascular Ultrasound/IVUS;  Surgeon: Burnell Blanks, MD;  Location: [facility];  Service: Cardiovascular;   Laterality: N/A;  . Coronary artery bypass graft N/A 03/10/2015    Procedure: CORONARY ARTERY BYPASS GRAFTING (CABG) x four  , using left internal mammary artery and bilateral greater saphenous veins harvested endoscopically;  Surgeon: Ivin Poot, MD;  Location: [facility];  Service: Open Heart Surgery;  Laterality: N/A;  . Tee without cardioversion N/A 03/10/2015    Procedure: TRANSESOPHAGEAL ECHOCARDIOGRAM (TEE);  Surgeon: Ivin Poot, MD;  Location: [facility];  Service: Open Heart Surgery;  Laterality: N/A;    Home Medications:    Medication List    ASK your doctor about these medications        aspirin 81 MG tablet  Take 81 mg by mouth 2 (two) times daily.     clonazePAM 1 MG tablet  Commonly known as:  KLONOPIN  Take 1 mg by mouth daily.     clotrimazole-betamethasone cream  Commonly known as:  LOTRISONE  Apply 1 application topically as needed (for skin).     colchicine 0.6 MG tablet  Take 0.6 mg by mouth daily as needed (for gout). Reported on 06/04/2015     glimepiride 2 MG tablet  Commonly known as:  AMARYL  Take 2 mg by mouth 2 (two) times daily.     isosorbide mononitrate 30 MG 24 hr tablet  Commonly known as:  IMDUR  Take 30 mg by mouth every other day.     lovastatin 20 MG tablet  Commonly known as:  MEVACOR  Take 1 tablet (20 mg total) by mouth at bedtime.     METROGEL 1 % gel  Generic drug:  metroNIDAZOLE  Apply 1 application topically daily.     MULTIVITAMIN ADULT PO  Take 1 tablet by mouth daily.     naproxen sodium 220 MG tablet  Commonly known as:  ANAPROX  Take 220 mg by mouth daily as needed (for arthritis).     nitroGLYCERIN 0.4 MG SL tablet  Commonly known as:  NITROSTAT  Place 1 tablet (0.4 mg total) under the tongue every 5 (five) minutes as needed.     sodium polystyrene 15 GM/60ML suspension  Commonly known as:  KAYEXALATE  Take 30 g by mouth every 30 (thirty) days.     Vitamin D3 2000 units Tabs  Take 2,000 Units by mouth daily.         Allergies: No Known Allergies  Family History: Family History  Problem Relation Age of Onset  . Liver disease Mother   . Gallbladder disease Father   . Heart attack Sister   . Bone cancer Sister     Social History:  reports that he quit smoking about 45 years ago. His smoking use included Cigarettes. He started smoking about 60 years ago. He has a 25 pack-year smoking history. He has never used smokeless tobacco. He reports that he does not drink alcohol or use illicit drugs.  ROS: 12 point ROS negative except per HPI                                        Physical Exam: BP 121/52 mmHg  Pulse 73  Temp(Src) 97.8 F (36.6 C)  Resp 15  Ht 5\' 11"  (1.803 m)  Wt 211 lb (95.709 kg)  BMI 29.44 kg/m2  SpO2 100%  Constitutional:  Alert and oriented, No acute distress. HEENT: [place] AT, moist mucus membranes.  Trachea midline, no masses. Cardiovascular: No clubbing, cyanosis, or edema. Respiratory: Normal respiratory effort, no increased work of breathing. GI: Abdomen is soft, nontender, nondistended, no abdominal masses GU: No CVA tenderness. Small residual stump of penis noted. No evidence of recurrence of disease. Skin: No rashes, bruises or suspicious lesions. Lymph: No cervical or inguinal adenopathy. Neurologic: Grossly intact, no focal deficits, moving all 4 extremities. Psychiatric: Normal mood and affect.  Laboratory Data: Lab Results  Component Value Date   WBC 8.6 06/08/2015   HGB 11.6* 06/08/2015   HCT 37.1* 06/08/2015   MCV 94.9 06/08/2015   PLT 325 06/08/2015    Lab Results  Component Value Date   CREATININE 2.61* 06/08/2015    No results found for: PSA  No results found for: TESTOSTERONE  Lab Results  Component Value Date   HGBA1C 7.3* 03/09/2015    Urinalysis    Component Value Date/Time   COLORURINE YELLOW 03/16/2015 1400   APPEARANCEUR CLEAR 03/16/2015 1400   LABSPEC 1.008 03/16/2015 1400   PHURINE 6.0 03/16/2015 1400    GLUCOSEU NEGATIVE 03/16/2015 1400   HGBUR NEGATIVE 03/16/2015 1400   BILIRUBINUR NEGATIVE 03/16/2015 1400   KETONESUR NEGATIVE 03/16/2015 1400   PROTEINUR NEGATIVE 03/16/2015 1400   UROBILINOGEN 0.2 05/27/2014 1327   NITRITE NEGATIVE 03/16/2015 1400   LEUKOCYTESUR NEGATIVE 03/16/2015 1400   Procedure: A 12 French Foley catheter was placed in the patient's bladder through the tip of his penile stump. It was somewhat stenotic distally. The catheter was  otherwise easily place. Clear urine was immediately returned.   Assessment & Plan:    1. Urinary retention 2. History of penile cancer 3. Solitary kidney -Recommend continued Foley catheter with outpatient follow-up in week for trial of void -Recommend starting Flomax after hypotension resolves -No further urological intervention indicated at this time. We'll plan for outpatient.   Nickie Retort, MD

## 2015-06-08 NOTE — Anesthesia Procedure Notes (Signed)
Procedure Name: Intubation Date/Time: 06/08/2015 7:45 AM Performed by: Rebekah Chesterfield L Pre-anesthesia Checklist: Patient identified, Emergency Drugs available, Suction available and Patient being monitored Patient Re-evaluated:Patient Re-evaluated prior to inductionOxygen Delivery Method: Circle System Utilized Preoxygenation: Pre-oxygenation with 100% oxygen Intubation Type: IV induction Ventilation: Mask ventilation without difficulty Laryngoscope Size: Mac and 4 Grade View: Grade I Tube type: Oral Tube size: 8.0 mm Number of attempts: 1 Airway Equipment and Method: Stylet Placement Confirmation: ETT inserted through vocal cords under direct vision,  positive ETCO2 and breath sounds checked- equal and bilateral Secured at: 22 cm Tube secured with: Tape Dental Injury: Teeth and Oropharynx as per pre-operative assessment

## 2015-06-08 NOTE — Progress Notes (Signed)
*  PRELIMINARY RESULTS* Vascular Ultrasound Carotid Duplex (Doppler) has been completed.  Preliminary findings: Right ICA appears patent.    Landry Mellow, RDMS, RVT  06/08/2015, 2:21 PM

## 2015-06-08 NOTE — Anesthesia Postprocedure Evaluation (Signed)
Anesthesia Post Note  Patient: [name] [name]  Procedure(s) Performed: Procedure(s) (LRB): ENDARTERECTOMY CAROTID (Right)  Patient location during evaluation: PACU Anesthesia Type: General Level of consciousness: awake and alert Pain management: pain level controlled Vital Signs Assessment: post-procedure vital signs reviewed and stable Respiratory status: spontaneous breathing, nonlabored ventilation, respiratory function stable and patient connected to nasal cannula oxygen Cardiovascular status: blood pressure returned to baseline and stable Postop Assessment: no signs of nausea or vomiting Anesthetic complications: no    Last Vitals:  Filed Vitals:   06/08/15 1256 06/08/15 1328  BP: 88/45 113/56  Pulse: 65 69  Temp:    Resp: 17 12    Last Pain: There were no vitals filed for this visit.               Logann Whitebread,W. EDMOND

## 2015-06-08 NOTE — Consult Note (Signed)
Requesting Physician: Dr. Oneida Alar     Chief Complaint: stroke History obtained from:  Patient and chart  HPI:                                                                                                                                         [name] [name] is an 75 y.o. 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 He was last seen by Dr. Oneida Alar in the hospital prior to his CABG on 03/10/2015. At that time, he was originally scheduled for an elective carotid endarterectomy but given a positive stress test this was canceled. He deferred cardiac cath due to concerns of his renal function. He underwent catheterization on 03/02/15 which showed multivessel disease and was then scheduled for a CABG with Dr. [name]. He does take ASA daily, presented today for his right CEA.  Immediately post-op he was fine for the first 2 hrs after surgery but developed sudden onset left arm weakness and the "feeling of being asleep".   Date last known well: 06/08/2015 Time last known well: 12:39pm tPA Given: No: recent surgery   Past Medical History  Diagnosis Date  . Coronary artery disease     quiescent on medical therapy,abnormal exercise Cardiolite suggestive of apical ischemia;EF 54%,02/10  . Hyperlipidemia     abnormal LFT's,resolved,secondary to lipitor  . Chronic kidney disease     chronic renal insufficiency,single functional kidney  . Tobacco abuse   . 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 Hypertension   . Shortness of breath dyspnea     with exertion  . Diabetes mellitus without complication (HCC)     Type 2  . Arthritis   . Gout   . Abnormal nuclear stress test   . Peripheral vascular disease ([place])     carotid artery blockage    Past Surgical History  Procedure Laterality Date  . Bladder surgery  1980  . Penectomy      surgery for carcinoma of penis  . 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 Cardiac  catheterization N/A 03/02/2015    Procedure: Left Heart Cath and Coronary Angiography;  Surgeon: Burnell Blanks, MD;  Location: [facility];  Service: Cardiovascular;  Laterality: N/A;  . Cardiac catheterization N/A 03/02/2015    Procedure: Intravascular Ultrasound/IVUS;  Surgeon: Burnell Blanks, MD;  Location: [facility];  Service: Cardiovascular;  Laterality: N/A;  . Coronary artery bypass graft N/A 03/10/2015    Procedure: CORONARY ARTERY BYPASS GRAFTING (CABG) x four  , using left internal mammary artery and bilateral greater saphenous veins harvested endoscopically;  Surgeon: Ivin Poot, MD;  Location: [facility];  Service: Open Heart Surgery;  Laterality: N/A;  . Tee without cardioversion N/A 03/10/2015    Procedure: TRANSESOPHAGEAL ECHOCARDIOGRAM (TEE);  Surgeon: Tharon Aquas  Kerby Less, MD;  Location: [facility];  Service: Open Heart Surgery;  Laterality: N/A;    Family History  Problem Relation Age of Onset  . Liver disease Mother   . Gallbladder disease Father   . Heart attack Sister   . Bone cancer Sister    Social History:  reports that he quit smoking about 45 years ago. His smoking use included Cigarettes. He started smoking about 60 years ago. He has a 25 pack-year smoking history. He has never used smokeless tobacco. He reports that he does not drink alcohol or use illicit drugs.  Allergies: No Known Allergies  Medications:                                                                                                                           Prior to Admission:  Prescriptions prior to admission  Medication Sig Dispense Refill Last Dose  . aspirin 81 MG tablet Take 81 mg by mouth 2 (two) times daily.   06/08/2015 at 0330  . Cholecalciferol (VITAMIN D3) 2000 UNITS TABS Take 2,000 Units by mouth daily.    06/07/2015 at Unknown time  . clonazePAM (KLONOPIN) 1 MG tablet Take 1 mg by mouth daily.    06/07/2015 at Unknown time  . glimepiride (AMARYL) 2 MG tablet Take 2  mg by mouth 2 (two) times daily.     06/07/2015 at Unknown time  . isosorbide mononitrate (IMDUR) 30 MG 24 hr tablet Take 30 mg by mouth every other day.   06/07/2015 at Unknown time  . lovastatin (MEVACOR) 20 MG tablet Take 1 tablet (20 mg total) by mouth at bedtime. 90 tablet 3 06/07/2015 at Unknown time  . metroNIDAZOLE (METROGEL) 1 % gel Apply 1 application topically daily.     06/08/2015 at 0330  . Multiple Vitamins-Minerals (MULTIVITAMIN ADULT PO) Take 1 tablet by mouth daily.    06/07/2015 at Unknown time  . nitroGLYCERIN (NITROSTAT) 0.4 MG SL tablet Place 1 tablet (0.4 mg total) under the tongue every 5 (five) minutes as needed. (Patient taking differently: Place 0.4 mg under the tongue every 5 (five) minutes as needed for chest pain. ) 25 tablet 3 Taking  . sodium polystyrene (KAYEXALATE) 15 GM/60ML suspension Take 30 g by mouth every 30 (thirty) days.    Past Month at Unknown time  . clotrimazole-betamethasone (LOTRISONE) cream Apply 1 application topically as needed (for skin).    More than a month at Unknown time  . colchicine 0.6 MG tablet Take 0.6 mg by mouth daily as needed (for gout). Reported on 06/04/2015   More than a month at Unknown time  . naproxen sodium (ANAPROX) 220 MG tablet Take 220 mg by mouth daily as needed (for arthritis).   More than a month at Unknown time   Scheduled: . albumin human      . Chlorhexidine Gluconate Cloth  6 each Topical Once   And  . Chlorhexidine Gluconate Cloth  6 each Topical Once  .  clonazePAM  1 mg Oral Daily    ROS:                                                                                                                                       History obtained from the patient  General ROS: negative for - chills, fatigue, fever, night sweats, weight gain or weight loss Psychological ROS: negative for - behavioral disorder, hallucinations, memory difficulties, mood swings or suicidal ideation Ophthalmic ROS: negative for - blurry vision, double  vision, eye pain or loss of vision ENT ROS: negative for - epistaxis, nasal discharge, oral lesions, sore throat, tinnitus or vertigo Allergy and Immunology ROS: negative for - hives or itchy/watery eyes Hematological and Lymphatic ROS: negative for - bleeding problems, bruising or swollen lymph nodes Endocrine ROS: negative for - galactorrhea, hair pattern changes, polydipsia/polyuria or temperature intolerance Respiratory ROS: negative for - cough, hemoptysis, shortness of breath or wheezing Cardiovascular ROS: negative for - chest pain, dyspnea on exertion, edema or irregular heartbeat Gastrointestinal ROS: negative for - abdominal pain, diarrhea, hematemesis, nausea/vomiting or stool incontinence Genito-Urinary ROS: negative for - dysuria, hematuria, incontinence or urinary frequency/urgency Musculoskeletal ROS: negative for - joint swelling or muscular weakness Neurological ROS: as noted in HPI Dermatological ROS: negative for rash and skin lesion changes  Neurologic Examination:                                                                                                      Blood pressure 84/50, pulse 64, temperature 97.8 F (36.6 C), resp. rate 15, height 5\' 11"  (1.803 m), weight 95.709 kg (211 lb), SpO2 100 %.  HEENT-  Normocephalic, no lesions, without obvious abnormality.  Normal external eye and conjunctiva.  Normal TM's bilaterally.  Normal auditory canals and external ears. Normal external nose, mucus membranes and septum.  Normal pharynx. Cardiovascular- S1, S2 normal, pulses palpable throughout   Lungs- chest clear, no wheezing, rales, normal symmetric air entry Abdomen- normal findings: bowel sounds normal Extremities- no edema Lymph-no adenopathy palpable Musculoskeletal-no joint tenderness, deformity or swelling Skin-warm and dry, no hyperpigmentation, vitiligo, or suspicious lesions  Neurological Examination Mental Status: Alert, oriented, thought content  appropriate.  Speech fluent without evidence of aphasia.  Able to follow 3 step commands without difficulty. Cranial Nerves: II:  Visual fields grossly normal, pupils equal, round, reactive to light and accommodation III,IV, VI: ptosis not present, extra-ocular motions intact bilaterally V,VII: smile symmetric, facial light touch sensation normal bilaterally VIII: hearing normal  bilaterally IX,X: uvula rises symmetrically XI: bilateral shoulder shrug XII: midline tongue extension Motor: Right : Upper extremity   5/5    Left:     Upper extremity   4/5  Lower extremity   5/5     Lower extremity   5/5 Tone and bulk:normal tone throughout; no atrophy noted Sensory: Pinprick and light touch intact throughout, bilaterally Deep Tendon Reflexes: 2+ and symmetric throughout Plantars: Right: downgoing   Left: downgoing Cerebellar: He has discoordination out of proportion to weakness on finger-to-nose on left, normal on the right Gait: not tested    Lab Results: Basic Metabolic Panel:  Recent Labs Lab 06/08/15 0643  NA 139  K 5.1  CL 109  CO2 23  GLUCOSE 169*  BUN 44*  CREATININE 2.61*  CALCIUM 9.3    Liver Function Tests:  Recent Labs Lab 06/08/15 0643  AST 18  ALT 19  ALKPHOS 70  BILITOT 0.4  PROT 6.0*  ALBUMIN 3.4*   No results for input(s): LIPASE, AMYLASE in the last 168 hours. No results for input(s): AMMONIA in the last 168 hours.  CBC:  Recent Labs Lab 06/08/15 0643  WBC 8.6  HGB 11.6*  HCT 37.1*  MCV 94.9  PLT 325    Cardiac Enzymes: No results for input(s): CKTOTAL, CKMB, CKMBINDEX, TROPONINI in the last 168 hours.  Lipid Panel: No results for input(s): CHOL, TRIG, HDL, CHOLHDL, VLDL, LDLCALC in the last 168 hours.  CBG:  Recent Labs Lab 06/08/15 0635 06/08/15 1046  GLUCAP 169* 207*    Microbiology: Results for orders placed or performed during the hospital encounter of 06/08/15  Surgical pcr screen     Status: None   Collection Time:  06/08/15  7:02 AM  Result Value Ref Range Status   MRSA, PCR NEGATIVE NEGATIVE Final   Staphylococcus aureus NEGATIVE NEGATIVE Final    Comment:        The Xpert SA Assay (FDA approved for NASAL specimens in patients over 44 years of age), is one component of a comprehensive surveillance program.  Test performance has been validated by [facility] for patients greater than or equal to 29 year old. It is not intended to diagnose infection nor to guide or monitor treatment.     Coagulation Studies:  Recent Labs  06/08/15 0643  LABPROT 14.8  INR 1.14    Imaging: No results found.     Assessment and plan discussed with with attending physician and they are in agreement.    Etta Quill PA-C [facility] [phone]  06/08/2015, 1:36 PM   Assessment: 75 y.o. 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   Stroke Risk Factors - carotid stenosis, diabetes mellitus, hyperlipidemia, hypertension and smoking  1. HgbA1c, fasting lipid panel 2. MRI, MRA  of the brain without contrast 3. Frequent neuro checks 4. Prophylactic therapy-Antiplatelet med: Aspirin - dose 325mg  PO or 300mg  PR 5. Risk factor modification 6. Telemetry monitoring 7. PT consult, OT consult, Speech consult 8. please page stroke NP  Or  PA  Or MD  M-F from 8am -4 pm starting 06/09/15 as this patient will be followed by the stroke team at this point.   You can look them up on [email or web address]    Roland Rack, MD [facility] [phone]  If 7pm- 7am, please page neurology on call as listed in [place].

## 2015-06-08 NOTE — Progress Notes (Signed)
Instructed pt to obtain urine sample if possible, states "I don't think I can get one now".

## 2015-06-08 NOTE — H&P (View-Only) (Signed)
[facility] [place]  Patient name: [name] [name] MRN: [id number] DOB: 18-Aug-1940 Sex: male  REASON FOR VISIT: follow-up  HPI: [name] [name] is a 75 y.o. 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 He was last seen by Dr. Oneida Alar in the hospital prior to his CABG on 03/10/2015. He was originally scheduled for an elective carotid endarterectomy but given a positive stress test this was canceled. He deferred cardiac cath due to concerns of his renal function. He underwent catheterization on 03/02/15 which showed multivessel disease and was then scheduled for a CABG with Dr. [name].   The patient is doing well following his CABG. The patient denies any episodes of amaurosis fugax or hemiparesis. He does describe an episode where his toes went numb a couple months ago. He resumed his Imdur and his symptoms resolved. His cardiologist is Dr. Bronson Ing who he will see him soon.  He takes 2 baby aspirin daily. He has a past medical history of hyperlipidemia managed on a statin. His diabetes is stable on oral hypoglycemics. He recently saw a nephrologist for his chronic kidney disease. His renal function is stable. His hypertension is stable. He is a former smoker.  Past Medical History  Diagnosis Date  . Coronary artery disease     quiescent on medical therapy,abnormal exercise Cardiolite suggestive of apical ischemia;EF 54%,02/10  . Hyperlipidemia     abnormal LFT's,resolved,secondary to lipitor  . Chronic kidney disease     chronic renal insufficiency,single functional kidney  . Tobacco abuse   . Penile cancer ([place])     history of penile cancer/squamous cell  . Hypertension   . Shortness of breath dyspnea     with exertion  . Diabetes mellitus without complication (HCC)     Type 2  . Arthritis   . Gout   . Abnormal nuclear stress test     Family History  Problem Relation Age of Onset  .  Liver disease Mother   . Gallbladder disease Father   . Heart attack Sister   . Bone cancer Sister     SOCIAL HISTORY: Social History  Substance Use Topics  . Smoking status: Former Smoker -- 1.00 packs/day for 25 years    Types: Cigarettes    Start date: 01/04/1955    Quit date: 01/03/1970  . Smokeless tobacco: Never Used  . Alcohol Use: No    No Known Allergies  Current Outpatient Prescriptions  Medication Sig Dispense Refill  . aspirin 81 MG tablet Take 81 mg by mouth 2 (two) times daily.    . Cholecalciferol (VITAMIN D3) 2000 UNITS TABS Take 2,000 Units by mouth daily.     . clonazePAM (KLONOPIN) 1 MG tablet Take 1 mg by mouth daily.     . clotrimazole-betamethasone (LOTRISONE) cream Apply 1 application topically as needed (for skin).     Marland Kitchen glimepiride (AMARYL) 2 MG tablet Take 2 mg by mouth 2 (two) times daily.      . Isosorbide Mononitrate (IMDUR PO) Take by mouth daily.    . Lactobacillus (ACIDOPHILUS) CAPS Take 1 capsule by mouth daily as needed (for immune system).     Marland Kitchen lovastatin (MEVACOR) 20 MG tablet Take 1 tablet (20 mg total) by mouth at bedtime. 90 tablet 3  . metroNIDAZOLE (METROGEL) 1 % gel Apply 1 application topically daily.      . Multiple Vitamins-Minerals (MULTIVITAMIN ADULT PO) Take by mouth daily.    Marland Kitchen  nitroGLYCERIN (NITROSTAT) 0.4 MG SL tablet Place 1 tablet (0.4 mg total) under the tongue every 5 (five) minutes as needed. (Patient taking differently: Place 0.4 mg under the tongue every 5 (five) minutes as needed for chest pain. ) 25 tablet 3  . sodium polystyrene (KAYEXALATE) 15 GM/60ML suspension Take 30 g by mouth every 30 (thirty) days.     Marland Kitchen acetaminophen (TYLENOL) 325 MG tablet Take 2 tablets (650 mg total) by mouth every 4 (four) hours as needed for headache or mild pain. (Patient not taking: Reported on 06/04/2015)    . aspirin EC 325 MG EC tablet Take 1 tablet (325 mg total) by mouth daily. (Patient not taking: Reported on 06/04/2015) 30 tablet 0  .  colchicine 0.6 MG tablet Take 0.6 mg by mouth daily as needed (for gout). Reported on 06/04/2015    . ferrous [id number] C-folic acid (TRINSICON / FOLTRIN) capsule Take 1 capsule by mouth 3 (three) times daily after meals. (Patient not taking: Reported on 06/04/2015) 90 capsule 1  . furosemide (LASIX) 20 MG tablet Take 1 tablet (20 mg total) by mouth daily as needed. For leg and ankle swelling (Patient not taking: Reported on 06/04/2015) 30 tablet 3  . midodrine (PROAMATINE) 10 MG tablet Take 1 tablet (10 mg total) by mouth 3 (three) times daily with meals. (Patient not taking: Reported on 06/04/2015) 90 tablet 1  . traMADol (ULTRAM) 50 MG tablet Take 1-2 tablets (50-100 mg total) by mouth every 4 (four) hours as needed for moderate pain. (Patient not taking: Reported on 06/04/2015) 30 tablet 0   No current facility-administered medications for this visit.    REVIEW OF SYSTEMS:  [X]  denotes positive finding, [ ]  denotes negative finding Cardiac  Comments:  Chest pain or chest pressure:    Shortness of breath upon exertion:    Short of breath when lying flat:    Irregular heart rhythm:        Vascular    Pain in calf, thigh, or hip brought on by ambulation:    Pain in feet at night that wakes you up from your sleep:     Blood clot in your veins:    Leg swelling:         Pulmonary    Oxygen at home:    Productive cough:     Wheezing:         Neurologic    Sudden weakness in arms or legs:     Sudden numbness in arms or legs:  x In toes, resolved with Imdur  Sudden onset of difficulty speaking or slurred speech:    Temporary loss of vision in one eye:     Problems with dizziness:         Gastrointestinal    Blood in stool:     Vomited blood:         Genitourinary    Burning when urinating:     Blood in urine:        Psychiatric    Major depression:         Hematologic    Bleeding problems:    Problems with blood clotting too easily:        Skin    Rashes or ulcers:         Constitutional    Fever or chills:      PHYSICAL EXAM: Filed Vitals:   06/04/15 1311  BP: 106/64  Pulse: 86  Temp: 98 F (36.7 C)  TempSrc: Oral  Resp: 16  Height: 5\' 11"  (1.803 m)  Weight: 211 lb 6.4 oz (95.89 kg)  SpO2: 97%    GENERAL: The patient is a well-nourished male, in no acute distress. The vital signs are documented above. CARDIAC: There is a regular rate and rhythm. No carotid bruits. Sternotomy incision has healed. VASCULAR: 2+ radial, femoral and dorsalis pedis pulses bilaterally. PULMONARY: There is good air exchange bilaterally without wheezing or rales. MUSCULOSKELETAL: There are no major deformities or cyanosis. NEUROLOGIC: No focal weakness or paresthesias are detected. SKIN: There are no ulcers or rashes noted. PSYCHIATRIC: The patient has a normal affect.  DATA:  Carotid duplex 06/03/2015  80-99% right proximal internal carotid artery stenosis Less than 40% left proximal internal carotid artery stenosis  Vertebral flow is antegrade bilaterally.  ABIs 05/11/2015 Right: 1.26 with triphasic PT and DP waveforms Left: 1.12 with triphasic DP and PT waveforms  MEDICAL ISSUES: Asymptomatic 80-99% right internal carotid artery stenosis Status post CABG 03/10/2015  The patient is doing well following his CABG. He will be scheduled for an elective right carotid endarterectomy on 06/08/2015 with Dr. Oneida Alar. He will continue aspirin.  Patient states that he had an episode of toe numbness a couple months ago that resolved with Imdur. He is to follow-up with his cardiologist Dr. Bronson Ing soon.   His noninvasive studies and clinical exam revealed normal lower extremity arterial function.  Virgina Jock, PA-C [facility] [place]   History and exam details as above. Patient has had a known ascending to medical high-grade right internal carotid artery stenosis for some time. He initially refused cardiac intervention despite a  positive stress test. He has now had coronary revascularization. He is also now prepared to have right carotid endarterectomy. He remains asymptomatic from this standpoint. He did complain of some problems with his legs recently. Recent ABIs were completely normal suggesting that his leg symptoms were not related to peripheral arterial disease. He also had palpable pedal pulses on exam today. On neuro exam he has symmetric upper extremity and lower extremity motor strength which is 5 over 5. He has no facial asymmetry.  Risks benefits possible complications and procedure details related to right carotid endarterectomy were discussed with the patient today. These include but are not limited to bleeding infection stroke risk cranial nerve injury risk he understands and agrees to proceed. This is scheduled for Monday June 08 2015  [name], MD [facility] [place] Office: [phone] Pager: [phone]

## 2015-06-08 NOTE — Progress Notes (Signed)
Patient arrived to [facility]. BP 115/45, HR NSR 70's. Pt A&Ox4 with strong grips; left weaker than right and symmetrical smile. Dopamine gtt continued. Incision clean dry and intact. Stroke swallow completed with no distress noted. Diet advanced per previous order. Will continue to monitor.

## 2015-06-09 ENCOUNTER — Inpatient Hospital Stay (HOSPITAL_COMMUNITY): Payer: Medicare HMO

## 2015-06-09 DIAGNOSIS — E785 Hyperlipidemia, unspecified: Secondary | ICD-10-CM

## 2015-06-09 DIAGNOSIS — I6350 Cerebral infarction due to unspecified occlusion or stenosis of unspecified cerebral artery: Secondary | ICD-10-CM

## 2015-06-09 DIAGNOSIS — I63411 Cerebral infarction due to embolism of right middle cerebral artery: Secondary | ICD-10-CM

## 2015-06-09 DIAGNOSIS — I959 Hypotension, unspecified: Secondary | ICD-10-CM

## 2015-06-09 DIAGNOSIS — I6521 Occlusion and stenosis of right carotid artery: Principal | ICD-10-CM

## 2015-06-09 LAB — ECHOCARDIOGRAM COMPLETE
CHL CUP MV DEC (S): 190
CHL CUP REG VEL DIAS: 82.8 cm/s
E decel time: 190 msec
EERAT: 11.1
FS: 31 % (ref 28–44)
HEIGHTINCHES: 71 in
IVS/LV PW RATIO, ED: 1.01
LA diam end sys: 41 mm
LA vol A4C: 56.6 ml
LADIAMINDEX: 1.82 cm/m2
LASIZE: 41 mm
LAVOL: 58.9 mL
LAVOLIN: 26.2 mL/m2
LDCA: 3.14 cm2
LV E/e' medial: 11.1
LV E/e'average: 11.1
LV PW d: 9.31 mm — AB (ref 0.6–1.1)
LV SIMPSON'S DISK: 74
LV dias vol: 81 mL (ref 62–150)
LV e' LATERAL: 10 cm/s
LV sys vol: 21 mL (ref 21–61)
LVDIAVOLIN: 36 mL/m2
LVOTD: 20 mm
LVSYSVOLIN: 9 mL/m2
Lateral S' vel: 15.2 cm/s
MV Peak grad: 5 mmHg
MV pk A vel: 107 m/s
MV pk E vel: 111 m/s
RV TAPSE: 18.9 mm
Stroke v: 60 ml
TDI e' lateral: 10
TDI e' medial: 5.22
WEIGHTICAEL: 3485.03 [oz_av]

## 2015-06-09 LAB — GLUCOSE, CAPILLARY
GLUCOSE-CAPILLARY: 179 mg/dL — AB (ref 65–99)
GLUCOSE-CAPILLARY: 77 mg/dL (ref 65–99)
GLUCOSE-CAPILLARY: 73 mg/dL (ref 65–99)
Glucose-Capillary: 156 mg/dL — ABNORMAL HIGH (ref 65–99)
Glucose-Capillary: 110 mg/dL — ABNORMAL HIGH (ref 65–99)

## 2015-06-09 LAB — HEMOGLOBIN A1C
HEMOGLOBIN A1C: 6.5 % — AB (ref 4.8–5.6)
MEAN PLASMA GLUCOSE: 140 mg/dL

## 2015-06-09 LAB — BASIC METABOLIC PANEL
Anion gap: 10 (ref 5–15)
BUN: 37 mg/dL — ABNORMAL HIGH (ref 6–20)
CALCIUM: 9.1 mg/dL (ref 8.9–10.3)
CO2: 22 mmol/L (ref 22–32)
CREATININE: 2.49 mg/dL — AB (ref 0.61–1.24)
Chloride: 109 mmol/L (ref 101–111)
GFR calc non Af Amer: 24 mL/min — ABNORMAL LOW (ref >60)
GFR, EST AFRICAN AMERICAN: 27 mL/min — AB (ref >60)
Glucose, Bld: 151 mg/dL — ABNORMAL HIGH (ref 65–99)
Potassium: 5 mmol/L (ref 3.5–5.1)
SODIUM: 141 mmol/L (ref 135–145)

## 2015-06-09 LAB — CBC
HCT: 34.6 % — ABNORMAL LOW (ref 39.0–52.0)
Hemoglobin: 11 g/dL — ABNORMAL LOW (ref 13.0–17.0)
MCH: 30 pg (ref 26.0–34.0)
MCHC: 31.8 g/dL (ref 30.0–36.0)
MCV: 94.3 fL (ref 78.0–100.0)
Platelets: 326 10*3/uL (ref 150–400)
RBC: 3.67 MIL/uL — ABNORMAL LOW (ref 4.22–5.81)
RDW: 13.2 % (ref 11.5–15.5)
WBC: 10.4 10*3/uL (ref 4.0–10.5)

## 2015-06-09 LAB — LIPID PANEL
CHOLESTEROL: 135 mg/dL (ref 0–200)
HDL: 57 mg/dL (ref >40)
LDL CALC: 59 mg/dL (ref 0–99)
Total CHOL/HDL Ratio: 2.4 RATIO
Triglycerides: 95 mg/dL (ref <150)
VLDL: 19 mg/dL (ref 0–40)

## 2015-06-09 NOTE — Evaluation (Signed)
Physical Therapy Evaluation Patient Details Name: [name] [name] MRN: [id number] DOB: 20-Mar-1940 Today's Date: 06/09/2015   History of Present Illness  75 y.o. male admitted to [facility] on 06/08/15 s/p R CEA with post op left arm weakness (possible CVA).  Pt with significant PMHx of CAD, CKD, HTN, SOB, DM, gout, PVD, bladder surgery, and CABG (03/2015).    Clinical Impression  Pt is progressing well with his mobility and gait.  He is a bit wobbly on his feet, likely due to post op status and quite a bit of time in the bed over the past 24 hours.  He will likely not need any PT f/u, but PT to re-assess in AM prior to d/c.  He may need some OT f/u for his left hand deficits.   PT to follow acutely for deficits listed below.       Follow Up Recommendations No PT follow up (outpatient OT to address left hand deficits)    Equipment Recommendations  None recommended by PT    Recommendations for Other Services   NA    Precautions / Restrictions Precautions Precautions: Other (comment) Precaution Comments: monitor BP      Mobility  Bed Mobility               General bed mobility comments: pt OOB in chair  Transfers Overall transfer level: Modified independent               General transfer comment: uses hands for transitions  Ambulation/Gait Ambulation/Gait assistance: Supervision Ambulation Distance (Feet): 300 Feet Assistive device: None Gait Pattern/deviations: Step-through pattern;Staggering left;Staggering right   Gait velocity interpretation: at or above normal speed for age/gender General Gait Details: 1-2 small staggers independently recovers, no signs of left LE strength deficits.  Pt reports this is the first time he has walked so far and feels like being inactive may be contributing to his small balance checks.       Modified Rankin (Stroke Patients Only) Modified Rankin (Stroke Patients Only) Pre-Morbid Rankin Score: No symptoms Modified Rankin: Moderate  disability     Balance Overall balance assessment: Needs assistance Sitting-balance support: Feet supported;No upper extremity supported Sitting balance-Leahy Scale: Good     Standing balance support: No upper extremity supported Standing balance-Leahy Scale: Good                               Pertinent Vitals/Pain Pain Assessment: No/denies pain    Home Living Family/patient expects to be discharged to:: Private residence Living Arrangements: Alone Available Help at Discharge: Friend(s);Available PRN/intermittently Type of Home: House Home Access: Level entry     Home Layout: One level;Laundry or work area in basement        Prior Function Level of Independence: Independent               Journalist, newspaper   Dominant Hand: Right    Extremity/Trunk Assessment   Upper Extremity Assessment: Defer to OT evaluation;LUE deficits/detail           Lower Extremity Assessment: Overall WFL for tasks assessed      Cervical / Trunk Assessment: Normal  Communication   Communication: No difficulties  Cognition Arousal/Alertness: Awake/alert Behavior During Therapy: WFL for tasks assessed/performed Overall Cognitive Status: Within Functional Limits for tasks assessed  Assessment/Plan    PT Assessment Patient needs continued PT services  PT Diagnosis Difficulty walking;Abnormality of gait;Generalized weakness;Hemiplegia non-dominant side   PT Problem List Decreased strength;Decreased activity tolerance;Decreased balance;Decreased mobility;Decreased knowledge of use of DME  PT Treatment Interventions DME instruction;Stair training;Gait training;Functional mobility training;Therapeutic activities;Therapeutic exercise;Balance training;Neuromuscular re-education;Patient/family education   PT Goals (Current goals can be found in the Care Plan section) Acute Rehab PT Goals Patient Stated Goal: to go home today or  tomorrow PT Goal Formulation: With patient Time For Goal Achievement: 06/23/15 Potential to Achieve Goals: Good    Frequency Min 4X/week           End of Session Equipment Utilized During Treatment: Gait belt Activity Tolerance: Patient limited by fatigue Patient left: in chair;with call bell/phone within reach;with family/visitor present Nurse Communication: Mobility status (asked RN tech to walk with pt later)         Time: 1258-1315 PT Time Calculation (min) (ACUTE ONLY): 17 min   Charges:   PT Evaluation $PT Eval Moderate Complexity: 1 Procedure          Pearlie Lafosse B. [place], [place], DPT [phone]   06/09/2015, 1:26 PM

## 2015-06-09 NOTE — Evaluation (Signed)
Occupational Therapy Evaluation Patient Details Name: THEON DIOP MRN: [id number] DOB: Oct 10, 1940 Today's Date: 06/09/2015    History of Present Illness 75 y.o. male admitted to [facility] on 06/08/15 s/p R CEA with post op left arm weakness (possible CVA).  Pt with significant PMHx of CAD, CKD, HTN, SOB, DM, gout, PVD, bladder surgery, and CABG (03/2015).     Clinical Impression   PT admitted with s/p R CEU now with L Ue deficits. Pt with pending workup for CVA. Pt currently with functional limitiations due to the deficits listed below (see OT problem list). Pta independent with all adls. Pt will benefit from skilled OT to increase their independence and safety with adls and balance to allow discharge outpatient for L UE.     Follow Up Recommendations  Outpatient OT    Equipment Recommendations  None recommended by OT    Recommendations for Other Services       Precautions / Restrictions Precautions Precautions: Other (comment) Precaution Comments: monitor BP      Mobility Bed Mobility               General bed mobility comments: pt OOB in chair  Transfers Overall transfer level: Modified independent               General transfer comment: uses hands for transitions    Balance Overall balance assessment: Needs assistance Sitting-balance support: Feet supported;No upper extremity supported Sitting balance-Leahy Scale: Good     Standing balance support: No upper extremity supported Standing balance-Leahy Scale: Good                              ADL                                         General ADL Comments: OT evaluation limited due to current bed level evaluation. pt with pending MRI and Dopamine running. Dr Erlinda Hong asking OT to limit evaluation at this time to bed level     Vision     Perception     Praxis      Pertinent Vitals/Pain Pain Assessment: No/denies pain     Hand Dominance Right   Extremity/Trunk  Assessment Upper Extremity Assessment Upper Extremity Assessment: LUE deficits/detail LUE Deficits / Details: ataxic movement. FULL AROM shoulder flexion, elbow flexion, digits AROM present but unable to reach full extension, sensation normal (abl to form number 2 with fingers but unable to fully extend) LUE Coordination: decreased fine motor   Lower Extremity Assessment Lower Extremity Assessment: Defer to PT evaluation   Cervical / Trunk Assessment Cervical / Trunk Assessment: Normal   Communication Communication Communication: No difficulties   Cognition Arousal/Alertness: Awake/alert Behavior During Therapy: WFL for tasks assessed/performed Overall Cognitive Status: Within Functional Limits for tasks assessed                     General Comments       Exercises Exercises: Other exercises Other Exercises Other Exercises: pt educated on fine motor and gross motor task to complete bed level at this time. pt educated to use L hand with thumb pointing up to reach for cup and then use R hand to support L hand to bring cup to his mouth. encouraged to complete shoulder flexion with R and L hand together at the same time.  Pt educated on the use of R UE with L Ue to help with return of functional use.    Shoulder Instructions      Home Living Family/patient expects to be discharged to:: Private residence Living Arrangements: Alone Available Help at Discharge: Friend(s);Available PRN/intermittently Type of Home: House Home Access: Level entry     Home Layout: One level;Laundry or work area in [employer] With: Alone    Prior Functioning/Environment Level of Independence: Independent             OT Diagnosis: Generalized weakness;Ataxia   OT Problem List: Decreased strength;Decreased activity tolerance;Impaired balance (sitting and/or standing);Decreased cognition;Impaired UE functional use   OT Treatment/Interventions:  Self-care/ADL training;Therapeutic exercise;Neuromuscular education;Therapeutic activities;Balance training;Patient/family education    OT Goals(Current goals can be found in the care plan section) Acute Rehab OT Goals Patient Stated Goal: to regain use of L hand OT Goal Formulation: With patient Time For Goal Achievement: 06/23/15 Potential to Achieve Goals: Good  OT Frequency: Min 2X/week   Barriers to D/C:            Co-evaluation              End of Session Nurse Communication: Mobility status;Precautions  Activity Tolerance: Patient tolerated treatment well Patient left: in bed;with call bell/phone within reach;with bed alarm set   Time: 1530-1545 OT Time Calculation (min): 15 min Charges:  OT General Charges $OT Visit: 1 Procedure OT Evaluation $OT Eval Moderate Complexity: 1 Procedure G-Codes:    Parke Poisson B 2015/07/03, 3:53 PM   Jeri Modena   OTR/L PagerIP:[phone] Office: [phone] .

## 2015-06-09 NOTE — Clinical Documentation Improvement (Signed)
Vascular Surgery  Abnormal Lab/Test Results:   BUN 6 - 20 mg/dL 37 (H) 44 (H)        Creatinine, Ser 0.61 - 1.24 mg/dL 2.49 (H) 2.61 (H)       GFR calc non Af Amer >60 mL/min 24 (L) 60 mL/min" class="[id number]" style="cursor: pointer; background-color: rgb(228, 223, 214);" onmouseover='jscript: var varStyle="underline"; var bgColor="#[id number]"; this.style.backgroundColor=bgColor; var children=this.getElementsByTagName("div"); for(var child=0;child 22 (L)        Possible Clinical Conditions associated with below indicators    Acute Renal Failure  Acute on Chronic Kidney Disease (stage I-V)  Other Condition  Cannot Clinically Determine   Supporting Information: Noted above  Treatment Provided: Bolus NS IV, IVF @ 30 ml  Evaluation: BMET   Please exercise your independent, professional judgment when responding. A specific answer is not anticipated or expected.   Thank You,  [place] [phone]

## 2015-06-09 NOTE — Progress Notes (Addendum)
  Progress Note    06/09/2015 7:28 AM 1 Day Post-Op  Subjective:  Feels like his left arm is a little wobbly, but feels it is better than yesterday.  Wants to go home-doesn't want to go with the catheter.  afebrile HR 70's-100's NSR 90's (with dopamine on 53mcg) -140's (on dopamine 49mcg) 96% 2LO2NC  Filed Vitals:   06/09/15 0325 06/09/15 0400  BP:  146/56  Pulse:  107  Temp: 98.8 F (37.1 C)   Resp:  24     Physical Exam: Neuro:  Intact with left hand grip & left arm strength slightly less than right Lungs:  Non labored Incision:  Clean and dry  CBC    Component Value Date/Time   WBC 10.4 06/09/2015 0535   RBC 3.67* 06/09/2015 0535   HGB 11.0* 06/09/2015 0535   HCT 34.6* 06/09/2015 0535   PLT 326 06/09/2015 0535   MCV 94.3 06/09/2015 0535   MCH 30.0 06/09/2015 0535   MCHC 31.8 06/09/2015 0535   RDW 13.2 06/09/2015 0535    BMET    Component Value Date/Time   NA 141 06/09/2015 0535   K 5.0 06/09/2015 0535   CL 109 06/09/2015 0535   CO2 22 06/09/2015 0535   GLUCOSE 151* 06/09/2015 0535   BUN 37* 06/09/2015 0535   CREATININE 2.49* 06/09/2015 0535   CALCIUM 9.1 06/09/2015 0535   GFRNONAA 24* 06/09/2015 0535   GFRAA 27* 06/09/2015 0535     Intake/Output Summary (Last 24 hours) at 06/09/15 0728 Last data filed at 06/09/15 0325  Gross per 24 hour  Intake 1724.37 ml  Output   2700 ml  Net -975.63 ml     Assessment/Plan:  This is a 75 y.o. male who is s/p right CEA 1 Day Post-Op  -pt is doing well this am.  He is requiring Dopamine for blood pressure support this am.  Will wean this as tolerated. -pt neuro exam is in tact with slightly weaker grip in the left hand; has good movements of the left arm -pt has ambulated -pt did have urinary retention yesterday and due to his hx of penectomy, urology was called to place foley.  It is recommended that the catheter stay in for a week and f/u in one week for void trial.  He does not want to go home with the  catheter. -most likely will need another day, but possible discharge later today if able to wean dopamine. -f/u with Dr. Oneida Alar in 2 weeks.   Leontine Locket, PA-C [facility] [phone]  Post op minor stroke recovering some Will transfer to 2w when off dopamine PT/OT today Will talk with urology later today about the catheter and pt concerns with renal function  [name], MD [facility] [place]: [phone] Pager: [phone]

## 2015-06-09 NOTE — Care Management Note (Signed)
Case Management Note  Patient Details  Name: [name] [name] MRN: [id number] Date of Birth: December 16, 1940  Subjective/Objective:   06/08/15 s/p R CEA with post op left arm weakness                  Action/Plan: Discharge Planning:  Chart reviewed.   Expected Discharge Date:                Expected Discharge Plan:  Home/Self Care  In-House Referral:  NA  Discharge planning Services  CM Consult  Post Acute Care Choice:  NA Choice offered to:  NA  DME Arranged:  N/A DME Agency:  NA  HH Arranged:  NA HH Agency:  NA  Status of Service:  Completed, signed off  Medicare Important Message Given:    Date Medicare IM Given:    Medicare IM give by:    Date Additional Medicare IM Given:    Additional Medicare Important Message give by:     If discussed at [place] of Stay Meetings, dates discussed:    Additional Comments:  Erenest Rasher, RN 06/09/2015, 3:27 PM

## 2015-06-09 NOTE — Progress Notes (Signed)
STROKE TEAM PROGRESS NOTE   HISTORY OF PRESENT ILLNESS (per record) [name] [name] is an 75 y.o. 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 He was last seen by Dr. Oneida Alar in the hospital prior to his CABG on 03/10/2015. At that time, he was originally scheduled for an elective carotid endarterectomy but given a positive stress test this was canceled. He deferred cardiac cath due to concerns of his renal function. He underwent catheterization on 03/02/15 which showed multivessel disease and was then scheduled for a CABG with Dr. [name]. He does take ASA daily, presented today for his right CEA. Immediately post-op he was fine for the first 2 hrs after surgery but developed sudden onset left arm weakness and the "feeling of being asleep". He was LKW 06/08/2015 at 12:39pm. Patient was not administered IV t-PA secondary to recent surgery.    SUBJECTIVE (INTERVAL HISTORY) No family at bedside. He still has mild left UE weakness, but he stated that it is much better than yesterday. He was barely able to move the LUE at that time. MRI still on hold due to low BP on dopamine.    OBJECTIVE Temp:  [97.4 F (36.3 C)-98.8 F (37.1 C)] 98.1 F (36.7 C) (06/06 0745) Pulse Rate:  [52-107] 91 (06/06 0745) Cardiac Rhythm:  [-] Normal sinus rhythm (06/06 0745) Resp:  [12-26] 19 (06/06 0745) BP: (78-150)/(43-74) 142/51 mmHg (06/06 0745) SpO2:  [93 %-100 %] 93 % (06/06 0745) Arterial Line BP: (55-150)/(35-71) 55/38 mmHg (06/05 1530) Weight:  [98.8 kg (217 lb 13 oz)] 98.8 kg (217 lb 13 oz) (06/05 1719)  CBC:  Recent Labs Lab 06/08/15 0643 06/09/15 0535  WBC 8.6 10.4  HGB 11.6* 11.0*  HCT 37.1* 34.6*  MCV 94.9 94.3  PLT 325 [id number]    Basic Metabolic Panel:  Recent Labs Lab 06/08/15 0643 06/09/15 0535  NA 139 141  K 5.1 5.0  CL 109 109  CO2 23 22  GLUCOSE 169* 151*  BUN 44* 37*  CREATININE 2.61* 2.49*  CALCIUM 9.3 9.1    Lipid Panel:    Component Value  Date/Time   CHOL 135 06/09/2015 0535   TRIG 95 06/09/2015 0535   HDL 57 06/09/2015 0535   CHOLHDL 2.4 06/09/2015 0535   VLDL 19 06/09/2015 0535   LDLCALC 59 06/09/2015 0535   HgbA1c:  Lab Results  Component Value Date   HGBA1C 6.5* 06/08/2015   Urine Drug Screen: No results found for: LABOPIA, COCAINSCRNUR, LABBENZ, AMPHETMU, THCU, LABBARB    IMAGING I have personally reviewed the radiological images below and agree with the radiology interpretations.  Ct Head Wo Contrast  06/08/2015  IMPRESSION: 1.  No evidence of acute intracranial abnormality. 2. Left maxillary sinusitis, probably chronic.  2D Echocardiogram  - Left ventricle: The cavity size was normal. Wall thickness was increased in a pattern of mild LVH. Systolic function was normal. The estimated ejection fraction was in the range of 60% to 65%. Wall motion was normal; there were no regional wall motion abnormalities. Features are consistent with a pseudonormal left ventricular filling pattern, with concomitant abnormal relaxation and increased filling pressure (grade 2 diastolic dysfunction).  CUS - Right ICA appears patent.  MRI and MRA - pending   PHYSICAL EXAM Physical exam  Temp:  [97.6 F (36.4 C)-98.9 F (37.2 C)] 98.3 F (36.8 C) (06/06 1959) Pulse Rate:  [67-107] 84 (06/06 1805) Resp:  [10-24] 21 (06/06 1805) BP: (82-146)/(40-56) 94/42 mmHg (06/06 1805) SpO2:  [93 %-  100 %] 100 % (06/06 1805)  General - Well nourished, well developed, in no apparent distress.  Ophthalmologic - Fundi not visualized due to noncooperation.  Cardiovascular - Regular rate and rhythm.  Mental Status -  Level of arousal and orientation to time, place, and person were intact. Language including expression, naming, repetition, comprehension was assessed and found intact. Fund of Knowledge was assessed and was intact.  Cranial Nerves II - XII - II - Visual field intact OU. III, IV, VI - Extraocular movements intact. V -  Facial sensation intact bilaterally. VII - Facial movement intact bilaterally. VIII - Hearing & vestibular intact bilaterally. X - Palate elevates symmetrically. XI - Chin turning & shoulder shrug intact bilaterally. XII - Tongue protrusion intact.  Motor Strength - The patient's strength was normal in all extremities except LUE 4+/5 proximal, 4/5 bicep and tricep, 3/5 finger grasp and pronator drift was present on the left.  Bulk was normal and fasciculations were absent.   Motor Tone - Muscle tone was assessed at the neck and appendages and was normal.  Reflexes - The patient's reflexes were 1+ in all extremities and he had no pathological reflexes.  Sensory - Light touch, temperature/pinprick were assessed and were symmetrical.    Coordination - The patient had normal movements in the hands and feet with no ataxia or dysmetria.  Tremor was absent.  Gait and Station - deferred.   ASSESSMENT/PLAN Mr. [name] [name] is a 75 y.o. male with history of CAD s/p CABG in  Feb, asymptomatic high-grade R ICA stenosis, poor renal  Function, hx penectomy who post R CEA developed sudden onset L arm weakness and sensory deficit.  He did not receive IV t-PA due to recent surgery.   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 No source of embolus   LDL 59  HgbA1c 6.5  SCDs for VTE prophylaxis  Diet Heart Room service appropriate?: Yes; Fluid consistency:: Thin  aspirin 81 mg daily prior to admission, now on aspirin 325 mg daily. Continue ASA on discharge.  Patient counseled to be compliant with his antithrombotic medications  Ongoing aggressive stroke risk factor management  Therapy recommendations:   No PT needs  Disposition:  Return home  Right ICA high grade stenosis   asymptomatic  s/p right  CEA  Hypotension  On dopamine  Wean as able  BP goal normotensive  Hyperlipidemia  Home meds:  mevacor 20, resumed in hospital (formulary change in hospital)  LDL 59, goal < 70  Continue statin at discharge  Diabetes type II  HgbA1c 6.5, goal < 7.0  Controlled  Other Stroke Risk Factors  Advanced age  Former Cigarette smoker  Obesity, Body mass index is 30.39 kg/(m^2).   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 After hours, contact General Neurology

## 2015-06-09 NOTE — Evaluation (Signed)
Speech Language Pathology Evaluation Patient Details Name: [name] [name] MRN: [id number] DOB: [birth year] Today's Date: 06/09/2015 Time: 1215-1230 SLP Time Calculation (min) (ACUTE ONLY): 15 min  Problem List:  Patient Active Problem List   Diagnosis Date Noted  . Carotid artery stenosis 06/08/2015  . 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 Carotid stenosis- high grade Rt 03/03/2015  . CAD (coronary artery disease) 03/02/2015  . Abnormal nuclear stress test   . Coronary artery disease involving native coronary artery of native heart without angina pectoris- plans for CABG   . Essential hypertension, benign 05/21/2009  . RENAL FAILURE, CHRONIC 11/20/2008  . Pure hypercholesterolemia 11/19/2008  . CORONARY ATHEROSCLEROSIS NATIVE CORONARY ARTERY 11/19/2008  . SHORTNESS OF BREATH 11/19/2008   Past Medical History:  Past Medical History  Diagnosis Date  . Coronary artery disease     quiescent on medical therapy,abnormal exercise Cardiolite suggestive of apical ischemia;EF 54%,02/10  . Hyperlipidemia     abnormal LFT's,resolved,secondary to lipitor  . Chronic kidney disease     chronic renal insufficiency,single functional kidney  . Tobacco abuse   . Penile cancer ([place])     history of penile cancer/squamous cell  . Hypertension   . Shortness of breath dyspnea     with exertion  . Diabetes mellitus without complication (HCC)     Type 2  . Arthritis   . Gout   . Abnormal nuclear stress test   . Peripheral vascular disease (HCC)     carotid artery blockage   Past Surgical History:  Past Surgical History  Procedure Laterality Date  . Bladder surgery  1980  . Penectomy      surgery for carcinoma of penis  . 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 Cardiac catheterization N/A 03/02/2015    Procedure: Left Heart Cath and Coronary Angiography;  Surgeon: Burnell Blanks, MD;  Location: [place] [facility];  Service: Cardiovascular;  Laterality: N/A;  . Cardiac  catheterization N/A 03/02/2015    Procedure: Intravascular Ultrasound/IVUS;  Surgeon: Burnell Blanks, MD;  Location: [place] [facility];  Service: Cardiovascular;  Laterality: N/A;  . Coronary artery bypass graft N/A 03/10/2015    Procedure: CORONARY ARTERY BYPASS GRAFTING (CABG) x four  , using left internal mammary artery and bilateral greater saphenous veins harvested endoscopically;  Surgeon: Ivin Poot, MD;  Location: [facility];  Service: Open Heart Surgery;  Laterality: N/A;  . Tee without cardioversion N/A 03/10/2015    Procedure: TRANSESOPHAGEAL ECHOCARDIOGRAM (TEE);  Surgeon: Ivin Poot, MD;  Location: [place];  Service: Open Heart Surgery;  Laterality: N/A;   HPI:  75 year old male admitted 06/08/15 for right CEA. PMH significant for CABG (03/10/2015), HLD, HTN, CKD, DM. Pt suffered a minor stroke following surgery, so BSE/SLE were ordered. Pt passed RN stroke swallow screen.   Assessment / Plan / Recommendation Clinical Impression  The Mini-mental state exam (MMSE) was administered. Pt scored 30/30, indicating normal performance on this assessment. Given level of independence prior to admit, pt was encouraged to notify MD if cognitive difficulty is noted after return to normal routines.    SLP Assessment  Patient does not need any further Speech Lanaguage Pathology Services    Follow Up Recommendations  None    Frequency and Duration  none recommended at this time.     SLP Evaluation Prior Functioning  Cognitive/Linguistic Baseline: Within functional limits Type of Home: House  Lives With: Alone Available Help at  Discharge: Friend(s);Available PRN/intermittently Education: 12th grade Vocation: Retired   Associate Professor  Overall Cognitive Status: Within Functional Limits for tasks assessed Arousal/Alertness: Awake/alert Orientation Level: Oriented X4 Attention: Focused;Sustained Focused Attention: Appears intact Sustained Attention: Appears intact Memory: Appears  intact Awareness: Appears intact Problem Solving: Appears intact Safety/Judgment: Appears intact Comments: 30/30 on MMSE    Comprehension  Auditory Comprehension Overall Auditory Comprehension: Appears within functional limits for tasks assessed Reading Comprehension Reading Status: Within funtional limits    Expression Expression Primary Mode of Expression: Verbal Verbal Expression Overall Verbal Expression: Appears within functional limits for tasks assessed Written Expression Dominant Hand: Right   Oral / Motor  Oral Motor/Sensory Function Overall Oral Motor/Sensory Function: Within functional limits Motor Speech Overall Motor Speech: Appears within functional limits for tasks assessed        Shonna Chock 06/09/2015, 12:36 PM  Celia B. Quentin Ore [facility], [place] [phone]

## 2015-06-10 ENCOUNTER — Inpatient Hospital Stay (HOSPITAL_COMMUNITY): Payer: Medicare HMO

## 2015-06-10 ENCOUNTER — Encounter (HOSPITAL_COMMUNITY): Payer: Self-pay | Admitting: Vascular Surgery

## 2015-06-10 DIAGNOSIS — Z9889 Other specified postprocedural states: Secondary | ICD-10-CM

## 2015-06-10 LAB — HEMOGLOBIN A1C
Hgb A1c MFr Bld: 6.1 % — ABNORMAL HIGH (ref 4.8–5.6)
MEAN PLASMA GLUCOSE: 128 mg/dL

## 2015-06-10 LAB — GLUCOSE, CAPILLARY
GLUCOSE-CAPILLARY: 66 mg/dL (ref 65–99)
GLUCOSE-CAPILLARY: 100 mg/dL — AB (ref 65–99)
Glucose-Capillary: 127 mg/dL — ABNORMAL HIGH (ref 65–99)
Glucose-Capillary: 163 mg/dL — ABNORMAL HIGH (ref 65–99)
Glucose-Capillary: 72 mg/dL (ref 65–99)

## 2015-06-10 MED ORDER — TAMSULOSIN HCL 0.4 MG PO CAPS
0.4000 mg | ORAL_CAPSULE | Freq: Every day | ORAL | Status: DC
  Administered 2015-06-10: 0.4 mg via ORAL
  Filled 2015-06-10: qty 1

## 2015-06-10 MED ORDER — ACETAMINOPHEN 325 MG PO TABS: 325.0000 mg | ORAL_TABLET | Freq: Four times a day (QID) | ORAL | Status: DC | PRN

## 2015-06-10 MED ORDER — ASPIRIN 325 MG PO TABS: 325.0000 mg | ORAL_TABLET | Freq: Every day | ORAL | Status: DC

## 2015-06-10 NOTE — Progress Notes (Signed)
Occupational Therapy Treatment Patient Details Name: [name] [name] MRN: [id number] DOB: 10-06-1940 Today's Date: 06/10/2015    History of present illness 75 y.o. male admitted to [facility] on 06/08/15 s/p R CEA with post op left arm weakness. MRI(+) Scattered acute micro embolic infarctions in the right MCA territory primarily affecting the parietal region Pt with significant PMHx of CAD, CKD, HTN, SOB, DM, gout, PVD, bladder surgery, and CABG (03/2015).     OT comments  Pt very upset on arrival and asking very direct / valid questions about urologist and pending discharge. Pt asking to know who the urologist that treated him at [facility] is and when he can get updates from this doctor. Pt asking why he can not try "flowmax" medication now if he has to stay in the hospital anyway to give his body a chance to void while hospitalized. And if he has to leave the hospital with a foley- "why can't I see a doctor closer to [facility] where I live? They have doctors at [facility] and I called this morning to check?"  Rn made aware of all patients concerns.  Session focused on L Ue movement and provided HEP L UE. Pt with good return demo. Pt demonstrates bathroom transfers this session with stable vital signs and no symptoms.    Follow Up Recommendations  Outpatient OT    Equipment Recommendations  None recommended by OT    Recommendations for Other Services      Precautions / Restrictions Precautions Precautions: Other (comment) Precaution Comments: monitor BP       Mobility Bed Mobility               General bed mobility comments: in chair on arrival  Transfers Overall transfer level: Modified independent                    Balance Overall balance assessment: Needs assistance Sitting-balance support: No upper extremity supported;Feet supported Sitting balance-Leahy Scale: Normal     Standing balance support: No upper extremity supported;During functional activity Standing  balance-Leahy Scale: Good                     ADL Overall ADL's : Needs assistance/impaired Eating/Feeding: Set up;Sitting                       Toilet Transfer: Min guard           Functional mobility during ADLs: Min guard General ADL Comments: Pt with much improved L Ue use compared to evaluation. pt with decr ataxic movement and more controlled movement. pt able to reach for an object and retrieve it without ataxic movement. pt able to open container. pt able to demonstrates theraputty exercises and fine motor exerices. pt with deficits with digit abduction and isolated mcp flexion. Pt able to read handout and follow along even if deficits were present      Vision                     Perception     Praxis      Cognition   Behavior During Therapy: [facility] for tasks assessed/performed Overall Cognitive Status: Within Functional Limits for tasks assessed                       Extremity/Trunk Assessment               Exercises Other Exercises Other Exercises: yellow  theraputty , x3 handouts ( finemotor x2 and putty exercises)   Shoulder Instructions       General Comments      Pertinent Vitals/ Pain       Pain Assessment: No/denies pain  Home Living   Living Arrangements: Alone                                      Prior Functioning/Environment              Frequency Min 2X/week     Progress Toward Goals  OT Goals(current goals can now be found in the care plan section)  Progress towards OT goals: Progressing toward goals  Acute Rehab OT Goals Patient Stated Goal: to regain use of L hand OT Goal Formulation: With patient Time For Goal Achievement: 06/23/15 Potential to Achieve Goals: Good ADL Goals Pt Will Perform Grooming: with modified independence;standing Additional ADL Goal #1: Pt will complete fine motor exercises using HEP independently (met) Additional ADL Goal #2:  (met)  Plan  Discharge plan remains appropriate    Co-evaluation                 End of Session Equipment Utilized During Treatment: Gait belt   Activity Tolerance Patient tolerated treatment well   Patient Left in chair;with call bell/phone within reach   Nurse Communication Mobility status;Precautions        Time: 6815-9470 OT Time Calculation (min): 33 min  Charges: OT General Charges $OT Visit: 1 Procedure OT Treatments $Self Care/Home Management : 8-22 mins $Therapeutic Exercise: 8-22 mins  Parke Poisson B 06/10/2015, 2:56 PM   Jeri Modena   OTR/L Pager: [phone] Office: [phone] .

## 2015-06-10 NOTE — Progress Notes (Signed)
STROKE TEAM PROGRESS NOTE   SUBJECTIVE (INTERVAL HISTORY) No family at bedside. He feel left arm continues to improve. Had MRI brain showed right MCA watershed small infarcts. BP stable off dopamine.    OBJECTIVE Temp:  [97.6 F (36.4 C)-98.9 F (37.2 C)] 97.8 F (36.6 C) (06/07 0800) Pulse Rate:  [65-85] 85 (06/07 0800) Cardiac Rhythm:  [-] Normal sinus rhythm (06/07 0800) Resp:  [10-24] 24 (06/07 0800) BP: (82-135)/(39-63) 115/60 mmHg (06/07 0800) SpO2:  [93 %-100 %] 100 % (06/07 0800)  CBC:   Recent Labs Lab 06/08/15 0643 06/09/15 0535  WBC 8.6 10.4  HGB 11.6* 11.0*  HCT 37.1* 34.6*  MCV 94.9 94.3  PLT 325 [id number]   Basic Metabolic Panel:   Recent Labs Lab 06/08/15 0643 06/09/15 0535  NA 139 141  K 5.1 5.0  CL 109 109  CO2 23 22  GLUCOSE 169* 151*  BUN 44* 37*  CREATININE 2.61* 2.49*  CALCIUM 9.3 9.1   Lipid Panel:     Component Value Date/Time   CHOL 135 06/09/2015 0535   TRIG 95 06/09/2015 0535   HDL 57 06/09/2015 0535   CHOLHDL 2.4 06/09/2015 0535   VLDL 19 06/09/2015 0535   LDLCALC 59 06/09/2015 0535   HgbA1c:  Lab Results  Component Value Date   HGBA1C 6.1* 06/09/2015   Urine Drug Screen: No results found for: LABOPIA, COCAINSCRNUR, LABBENZ, AMPHETMU, THCU, LABBARB    IMAGING I have personally reviewed the radiological images below and agree with the radiology interpretations. Blue text is my interpretation.  Ct Head Wo Contrast 06/08/2015  1.  No evidence of acute intracranial abnormality. 2. Left maxillary sinusitis, probably chronic.  2D Echocardiogram  - Left ventricle: The cavity size was normal. Wall thickness was increased in a pattern of mild LVH. Systolic function was normal. The estimated ejection fraction was in the range of 60% to 65%. Wall motion was normal; there were no regional wall motion abnormalities. Features are consistent with a pseudonormal left ventricular filling pattern, with concomitant abnormal relaxation and increased  filling pressure (grade 2 diastolic dysfunction).  CUS - Right ICA appears patent.  MRI and MRA - Scattered acute micro embolic infarctions in the right MCA territory primarily affecting the parietal region. No large confluent infarction. No hemorrhage or swelling. Right ICA supplies only the MCA territory, both anterior cerebral arteries receiving there supply from the left carotid circulation. Bilateral supraclinoid ICA moderate stenosis.   PHYSICAL EXAM General - Well nourished, well developed, in no apparent distress.  Ophthalmologic - Fundi not visualized due to noncooperation.  Cardiovascular - Regular rate and rhythm.  Mental Status -  Level of arousal and orientation to time, place, and person were intact. Language including expression, naming, repetition, comprehension was assessed and found intact. Fund of Knowledge was assessed and was intact.  Cranial Nerves II - XII - II - Visual field intact OU. III, IV, VI - Extraocular movements intact. V - Facial sensation intact bilaterally. VII - Facial movement intact bilaterally. VIII - Hearing & vestibular intact bilaterally. X - Palate elevates symmetrically. XI - Chin turning & shoulder shrug intact bilaterally. XII - Tongue protrusion intact.  Motor Strength - The patient's strength was normal in all extremities except LUE 5-/5 proximal, 4+/5 bicep and tricep, 3+/5 finger grasp and pronator drift was present on the left.  Bulk was normal and fasciculations were absent.   Motor Tone - Muscle tone was assessed at the neck and appendages and was normal.  Reflexes -  The patient's reflexes were 1+ in all extremities and he had no pathological reflexes.  Sensory - Light touch, temperature/pinprick were assessed and were symmetrical.    Coordination - The patient had normal movements in the hands and feet with no ataxia or dysmetria.  Tremor was absent.  Gait and Station - deferred.   ASSESSMENT/PLAN Mr. [name] [name]  is a 75 y.o. male with history of CAD s/p CABG in  Feb, asymptomatic high-grade R ICA stenosis, poor renal  Function, hx penectomy who post R CEA developed sudden onset L arm weakness and sensory deficit.  He did not receive IV t-PA due to recent surgery.   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 Hypotension induced hypoperfusion in the setting of stenosis of right ICA supraclinoid segment   Resultant  Left UE mild paresis  MRI  Right MCA/PCA and MCA/ACA watershed area small infarcts  MRA  Bilateral ICA supraclinoid segment moderate stenosis, R>L  Carotid Doppler - R ICA patent post R CEA  2D Echo  EF 60-65%. No source of embolus   LDL 59  HgbA1c 6.5  SCDs for VTE prophylaxis Diet Heart Room service appropriate?: Yes; Fluid consistency:: Thin  aspirin 81 mg daily prior to admission, now on aspirin 325 mg daily. Continue ASA on discharge.  Patient counseled to be compliant with his antithrombotic medications  Ongoing aggressive stroke risk factor management  Therapy recommendations:   No PT needs, OP PT  Disposition:  Return home  Right ICA high grade stenosis   asymptomatic  s/p right CEA  CUS showed right ICA patent   Hypotension  Off dopamine  BP goal normotensive  Hyperlipidemia  Home meds:  mevacor 20, resumed in hospital (formulary change in hospital)  LDL 59, goal < 70  Continue statin at discharge  Diabetes type II  HgbA1c 6.5, goal < 7.0  Controlled  Other Stroke Risk Factors  Advanced age  Former Cigarette smoker  Obesity, Body mass index is 30.39 kg/(m^2).   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 Please call with questions. Pt will follow up with carolyn Hassell Done NP at [facility] in about 2 months. Thanks for the consult.   Rosalin Hawking, MD PhD Stroke Neurology 06/10/2015 9:53 AM      To contact Stroke Continuity provider, please refer to [email or web address] After hours, contact General Neurology

## 2015-06-10 NOTE — Progress Notes (Signed)
PT Cancellation Note  Patient Details Name: FON RIJO MRN: [id number] DOB: 01-09-1940   Cancelled Treatment:    Reason Eval/Treat Not Completed: Patient at procedure or test/unavailable.  Pt is off the floor at a test.  PT to check back later today or tomorrow as time allows.    Thanks,   Barbarann Ehlers. [place], [place], DPT [phone]   06/10/2015, 10:21 AM

## 2015-06-10 NOTE — Discharge Summary (Signed)
Discharge Summary     [name] [name] 1940-12-29 75 y.o. male  [id number]  Admission Date: 06/08/2015  Discharge Date: 06/10/15  Physician: Elam Dutch, MD  Admission Diagnosis: Right carotid artery stenosis I65.21   HPI:   This is a 75 y.o. 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 He was last seen by Dr. Oneida Alar in the hospital prior to his CABG on 03/10/2015. He was originally scheduled for an elective carotid endarterectomy but given a positive stress test this was canceled. He deferred cardiac cath due to concerns of his renal function. He underwent catheterization on 03/02/15 which showed multivessel disease and was then scheduled for a CABG with Dr. [name].   The patient is doing well following his CABG. The patient denies any episodes of amaurosis fugax or hemiparesis. He does describe an episode where his toes went numb a couple months ago. He resumed his Imdur and his symptoms resolved. His cardiologist is Dr. Bronson Ing who he will see him soon.  He takes 2 baby aspirin daily. He has a past medical history of hyperlipidemia managed on a statin. His diabetes is stable on oral hypoglycemics. He recently saw a nephrologist for his chronic kidney disease. His renal function is stable. His hypertension is stable. He is a former smoker.  Hospital Course:  The patient was admitted to the hospital and taken to the operating room on 06/08/2015 and underwent right carotid endarterectomy.  The pt tolerated the procedure well and was transported to the PACU in good condition.  Later in pacu, the pt had left hand weakness primarily with wrist extension and numbness in teh lateral hand.  He did not have facial asymmetry or leg weakness or slurred speech.  Code stroke was called.  The pt had a carotid duplex, which revealed the right ICA patent.  Later that afternoon, the pt still ahd some weakness in his hand, but felt  like it was trying to wake up.  His systolic BP was > [id number] on dopamine gtt.  He did not have a hematoma and his tongue was midline.  He also underwent a head CT, which was negative.    Later that day, the pt did have some urinary retention in setting of solitary kidney and a urology consult was obtained.  Due to hx of penectomy, the nursing staff was unable to place a Foley catheter.  The pt had 980 cc of urine in his bladder.  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   By POD 1, the pt neuro status was in tact with the exception of his left arm weakness.  He was still requiring Dopamine for blood pressure support.  The pt had ambulated.    Neurology saw the pt and changed his aspirin from 81mg  to 325mg  daily.  He did have a 2D echo, which revealed an EF of 60-65% and no source for embolus.   His hgb A1c is 6.5 with goal less than 7.0.    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 No large confluent infarction. No hemorrhage or swelling. Right ICA supplies only the MCA territory, both anterior cerebral arteries receiving there supply from the left carotid circulation.  Also on  POD 2, he was off of dopamine and tolerating well. He did continue to have some LUE weakness.  The pt did consent to going home with the foley and a leg bag.  Outpatient referral for PT therapy was placed.    Pt with CKD 3-4 and pt's creatinine is 2.49 on POD 1.  He is instructed to discontinue taking his Naproxen and colchicine.    The remainder of the hospital course consisted of increasing mobilization and increasing intake of solids without difficulty.    Recent Labs  06/08/15 0643 06/09/15 0535  NA 139 141  K 5.1 5.0  CL 109 109  CO2 23 22  GLUCOSE 169*  151*  BUN 44* 37*  CALCIUM 9.3 9.1    Recent Labs  06/08/15 0643 06/09/15 0535  WBC 8.6 10.4  HGB 11.6* 11.0*  HCT 37.1* 34.6*  PLT 325 326    Recent Labs  06/08/15 0643  INR 1.14         Discharge Instructions    CAROTID Sugery: Call MD for difficulty swallowing or speaking; weakness in arms or legs that is a new symtom; severe headache.  If you have increased swelling in the neck and/or  are having difficulty breathing, CALL [phone]    Complete by:  As directed      Call MD for:  redness, tenderness, or signs of infection (pain, swelling, bleeding, redness, odor or green/yellow discharge around incision site)    Complete by:  As directed      Call MD for:  severe or increased pain, loss or decreased feeling  in affected limb(s)    Complete by:  As directed      Call MD for:  temperature >100.5    Complete by:  As directed      Discharge wound care:    Complete by:  As directed   Shower daily with soap and water starting today     Driving Restrictions    Complete by:  As directed   No driving for 2 weeks     Lifting restrictions    Complete by:  As directed   No lifting for 2 weeks     Resume previous diet    Complete by:  As directed            Discharge Diagnosis:  Right carotid artery stenosis I65.21  Secondary Diagnosis: Patient Active Problem List   Diagnosis Date Noted  . Carotid artery stenosis 06/08/2015  . 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 Carotid stenosis- high grade Rt 03/03/2015  . CAD (coronary artery disease) 03/02/2015  . Abnormal nuclear stress test   . Coronary artery disease involving native coronary artery of native heart without angina pectoris- plans for CABG   . Essential hypertension, benign 05/21/2009  . RENAL FAILURE, CHRONIC 11/20/2008  . Pure hypercholesterolemia 11/19/2008  . CORONARY ATHEROSCLEROSIS NATIVE CORONARY ARTERY 11/19/2008  . SHORTNESS OF BREATH 11/19/2008   Past Medical History  Diagnosis Date  . Coronary artery disease      quiescent on medical therapy,abnormal exercise Cardiolite suggestive of apical ischemia;EF 54%,02/10  . Hyperlipidemia     abnormal LFT's,resolved,secondary to lipitor  . Chronic kidney disease     chronic renal insufficiency,single functional kidney  . Tobacco abuse   . 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 Hypertension   . Shortness of breath dyspnea     with exertion  . Diabetes mellitus without complication ([place])  Type 2  . Arthritis   . Gout   . Abnormal nuclear stress test   . Peripheral vascular disease ([facility])     carotid artery blockage      Medication List    STOP taking these medications        colchicine 0.6 MG tablet     naproxen sodium 220 MG tablet  Commonly known as:  ANAPROX      TAKE these medications        acetaminophen 325 MG tablet  Commonly known as:  TYLENOL  Take 1-2 tablets (325-650 mg total) by mouth every 6 (six) hours as needed for mild pain (or temp >/= 101 F).     aspirin 325 MG tablet  Take 1 tablet (325 mg total) by mouth daily.     clonazePAM 1 MG tablet  Commonly known as:  KLONOPIN  Take 1 mg by mouth daily.     clotrimazole-betamethasone cream  Commonly known as:  LOTRISONE  Apply 1 application topically as needed (for skin).     glimepiride 2 MG tablet  Commonly known as:  AMARYL  Take 2 mg by mouth 2 (two) times daily.     isosorbide mononitrate 30 MG 24 hr tablet  Commonly known as:  IMDUR  Take 30 mg by mouth every other day.     lovastatin 20 MG tablet  Commonly known as:  MEVACOR  Take 1 tablet (20 mg total) by mouth at bedtime.     METROGEL 1 % gel  Generic drug:  metroNIDAZOLE  Apply 1 application topically daily.     MULTIVITAMIN ADULT PO  Take 1 tablet by mouth daily.     nitroGLYCERIN 0.4 MG SL tablet  Commonly known as:  NITROSTAT  Place 1 tablet (0.4 mg total) under the tongue every 5 (five) minutes as needed.     sodium polystyrene 15 GM/60ML suspension    Commonly known as:  KAYEXALATE  Take 30 g by mouth every 30 (thirty) days.     Vitamin D3 2000 units Tabs  Take 2,000 Units by mouth daily.        Prescriptions given: -Tylenol (aspirin 81mg  changed to 325mg  daily)  Instructions: 1.  Shower daily with soap and water starting at discharge  Disposition: home  Patient's condition: is Good  Follow up: 1. Dr. Oneida Alar in 2 weeks. 2. Dr. Pilar Jarvis in 1 week for foley removal 3. Dr. Erlinda Hong in 2 months   Leontine Locket, PA-C [facility] [phone]  --- For [facility] use ---   Modified Rankin score at D/C (0-6): 1  IV medication needed for:  1. Hypertension: No 2. Hypotension: Yes-dopamine  Post-op Complications: Yes  1. Post-op CVA or TIA: Yes  If yes: Event classification (right eye, left eye, right cortical, left cortical, verterobasilar, other): n/a  If yes: Timing of event: <6 hrs post-op  2. CN injury: No  If yes: CN n/a injuried   3. Myocardial infarction: No  If yes: Dx by (EKG or clinical, Troponin): n/a  4.  CHF: No  5.  Dysrhythmia (new): No  6. Wound infection: No  7. Reperfusion symptoms: No  8. Return to OR: No  If yes: return to OR for (bleeding, neurologic, other CEA incision, other): n/a  Discharge medications: Statin use:  Yes If No: [ ]  For Medical reasons, [ ]  Non-compliant, [ ]  Not-indicated ASA use:  Yes  If No: [ ]  For Medical reasons, [ ]  Non-compliant, [ ]   Not-indicated Beta blocker use:  No If No: [ ]  For Medical reasons, [ ]  Non-compliant, [ ]  Not-indicated ACE-Inhibitor use:  No If No: [ ]  For Medical reasons, [ ]  Non-compliant, [ ]  Not-indicated ARB use:  No P2Y12 Antagonist use: No, [ ]  Plavix, [ ]  Plasugrel, [ ]  Ticlopinine, [ ]  Ticagrelor, [ ]  Other, [ ]  No for medical reason, [ ]  Non-compliant, [ ]  Not-indicated Anti-coagulant use:  No, [ ]  Warfarin, [ ]  Rivaroxaban, [ ]  Dabigatran, [ ]  Other, [ ]  No for medical reason, [ ]  Non-compliant, [ ]   Not-indicated

## 2015-06-10 NOTE — Progress Notes (Addendum)
[facility]  Subjective  - He is making improvements in motion with his left arm.   Objective 135/63 81 98 F (36.7 C) (Oral) 19 99%  Intake/Output Summary (Last 24 hours) at 06/10/15 0805 Last data filed at 06/10/15 0600  Gross per 24 hour  Intake 1249.2 ml  Output   1925 ml  Net -675.8 ml    Left UE weakness, sensation intact  Smile is symmetric, no tongue deviation Heart RRR, off dopamine BP stable 116/60 Lung non labored breathing  Assessment/Planning: POD # 2 right CEA  Off dopamine Patient wants foley out? Pending MRA per neurology Disposition stable with improvements in left UE function Ct Head Wo Contrast  06/08/2015 IMPRESSION: 1. No evidence of acute intracranial abnormality  Theda Sers, EMMA [facility] 06/10/2015 8:05 AM -- Minor stroke right side.  MRA reviewed Some improvement in hand Tried to reach urology today but they had  Already left  For the day. Will start flowmax tonight Will have urology revisit pt tomorrow to see if he can go home without catheter.  [name], MD [facility] [place] Office: [phone] Pager: [phone]   Laboratory Lab Results:  Recent Labs  06/08/15 0643 06/09/15 0535  WBC 8.6 10.4  HGB 11.6* 11.0*  HCT 37.1* 34.6*  PLT 325 326   BMET  Recent Labs  06/08/15 0643 06/09/15 0535  NA 139 141  K 5.1 5.0  CL 109 109  CO2 23 22  GLUCOSE 169* 151*  BUN 44* 37*  CREATININE 2.61* 2.49*  CALCIUM 9.3 9.1    COAG Lab Results  Component Value Date   INR 1.14 06/08/2015   INR 1.48 03/10/2015   INR 1.06 03/09/2015   No results found for: PTT

## 2015-06-10 NOTE — Care Management Important Message (Signed)
Important Message  Patient Details  Name: [name] [name] MRN: [id number] Date of Birth: [birth year]   Medicare Important Message Given:  Yes    Nathen May 06/10/2015, 10:13 AM

## 2015-06-10 NOTE — Progress Notes (Signed)
Physical Therapy Treatment Patient Details Name: [name] [name] MRN: [id number] DOB: [birth year] Today's Date: 06/10/2015    History of Present Illness 75 y.o. male admitted to [facility] on 06/08/15 s/p R CEA with post op left arm weakness (possible CVA).  Pt with significant PMHx of CAD, CKD, HTN, SOB, DM, gout, PVD, bladder surgery, and CABG (03/2015).      PT Comments    Pt was able to demonstrate safe gait and stairs for home access.  Pt will need f/u therapy for his left hand deficits (which are improving daily).   No PT f/u needs at this time.  PT to follow acutely until d/c confirmed.     Follow Up Recommendations  No PT follow up (outpatient OT for L hand therapy)     Equipment Recommendations  None recommended by PT    Recommendations for Other Services   NA     Precautions / Restrictions Precautions Precautions: Other (comment) Precaution Comments: monitor BP    Mobility  Bed Mobility               General bed mobility comments: in chair on arrival  Transfers Overall transfer level: Modified independent               General transfer comment: used hands for transitions  Ambulation/Gait Ambulation/Gait assistance: Supervision Ambulation Distance (Feet): 320 Feet Assistive device: None Gait Pattern/deviations: Step-through pattern;Staggering left;Staggering right   Gait velocity interpretation: at or above normal speed for age/gender General Gait Details: mildly staggering gait pattern especially when walking and turning his head. He is able to recover without external physical assist.    Stairs Stairs: Yes Stairs assistance: Supervision Stair Management: One rail Left;One rail Right;Alternating pattern;Forwards Number of Stairs: 10 General stair comments: pt was able to do stairs, recipocally, taking his time and holding railing.  Supervision for safety, but no external assist needed.       Modified Rankin (Stroke Patients Only) Modified Rankin  (Stroke Patients Only) Pre-Morbid Rankin Score: No symptoms Modified Rankin: Moderate disability     Balance Overall balance assessment: Needs assistance Sitting-balance support: No upper extremity supported;Feet supported Sitting balance-Leahy Scale: Normal     Standing balance support: No upper extremity supported Standing balance-Leahy Scale: Good                      Cognition Arousal/Alertness: Awake/alert Behavior During Therapy: WFL for tasks assessed/performed Overall Cognitive Status: Within Functional Limits for tasks assessed                      Exercises Other Exercises Other Exercises: yellow theraputty , x3 handouts ( finemotor x2 and putty exercises)        Pertinent Vitals/Pain Pain Assessment: No/denies pain           PT Goals (current goals can now be found in the care plan section) Acute Rehab PT Goals Patient Stated Goal: to regain use of L hand and go home without the urinary catheter. Progress towards PT goals: Progressing toward goals    Frequency  Min 4X/week    PT Plan Current plan remains appropriate       End of Session Equipment Utilized During Treatment: Gait belt Activity Tolerance: Patient tolerated treatment well Patient left: in chair;with call bell/phone within reach;with family/visitor present     Time: 1555-1610 PT Time Calculation (min) (ACUTE ONLY): 15 min  Charges:  $Gait Training: 8-22 mins  Barbarann Ehlers [place], [place], DPT [phone]   06/10/2015, 4:35 PM

## 2015-06-11 ENCOUNTER — Telehealth: Payer: Self-pay | Admitting: Vascular Surgery

## 2015-06-11 ENCOUNTER — Encounter (HOSPITAL_COMMUNITY): Payer: Medicare Other

## 2015-06-11 ENCOUNTER — Ambulatory Visit: Payer: Medicare Other | Admitting: Family

## 2015-06-11 LAB — GLUCOSE, CAPILLARY: Glucose-Capillary: 102 mg/dL — ABNORMAL HIGH (ref 65–99)

## 2015-06-11 MED ORDER — TAMSULOSIN HCL 0.4 MG PO CAPS: 0.4000 mg | ORAL_CAPSULE | Freq: Every day | ORAL | Status: DC

## 2015-06-11 MED ORDER — OXYCODONE-ACETAMINOPHEN 5-325 MG PO TABS: 1.0000 | ORAL_TABLET | Freq: Four times a day (QID) | ORAL | Status: DC | PRN

## 2015-06-11 NOTE — Telephone Encounter (Signed)
-----   Message from Mena Goes, RN sent at 06/08/2015  1:43 PM EDT ----- Regarding: schedule   ----- Message -----    From: Gabriel Earing, PA-C    Sent: 06/08/2015  10:31 AM      To: Vvs Charge Pool  S/p right CEA 06/08/15.  F/u with Dr. Oneida Alar in 2 weeks.  Thanks, Aldona Bar

## 2015-06-11 NOTE — Progress Notes (Addendum)
  [facility] Progress Note  Subjective  - POD #3  Frustrated that he is still in the hospital. No other complaints.   Objective Filed Vitals:   06/10/15 2157 06/11/15 0451  BP: 102/52 118/57  Pulse: 73 84  Temp: 97.7 F (36.5 C) 97.7 F (36.5 C)  Resp: 16 16    Intake/Output Summary (Last 24 hours) at 06/11/15 0746 Last data filed at 06/11/15 0451  Gross per 24 hour  Intake    540 ml  Output   1975 ml  Net  -1435 ml   Right neck incision no hematoma.  Left upper extremity slightly weaker than right. Lower extremities 5/5.  Tongue midline. Smile symmetric.  Assessment/Planning: 75 y.o. male is s/p: right carotid endarterectomy, right brain CVA, urinary retention 3 Days Post-Op   Stable from neurology standpoint for d/c Will get in touch in urology this am regarding catheter and possible void trial. Patient is willing to go home with catheter.  Outpatient OT for left arm weakness.  Plan d/c home today.   Alvia Grove 06/11/2015 7:46 AM -- Continues to improve left hand function Spoke with urology this morning.  Needs to keep catheter D/c home with leg bag today on flomax at d/c  [name], MD [facility] [place]: [phone] Pager: [phone]   Laboratory CBC    Component Value Date/Time   WBC 10.4 06/09/2015 0535   HGB 11.0* 06/09/2015 0535   HCT 34.6* 06/09/2015 0535   PLT 326 06/09/2015 0535    BMET    Component Value Date/Time   NA 141 06/09/2015 0535   K 5.0 06/09/2015 0535   CL 109 06/09/2015 0535   CO2 22 06/09/2015 0535   GLUCOSE 151* 06/09/2015 0535   BUN 37* 06/09/2015 0535   CREATININE 2.49* 06/09/2015 0535   CALCIUM 9.1 06/09/2015 0535   GFRNONAA 24* 06/09/2015 0535   GFRAA 27* 06/09/2015 0535    COAG Lab Results  Component Value Date   INR 1.14 06/08/2015   INR 1.48 03/10/2015   INR 1.06 03/09/2015   No results found for: PTT  Antibiotics Anti-infectives    Start     Dose/Rate Route Frequency Ordered Stop   06/08/15 2000  cefUROXime (ZINACEF) 1.5 g in dextrose 5 % 50 mL IVPB     1.5 g 100 mL/hr over 30 Minutes Intravenous Every 12 hours 06/08/15 1735 06/09/15 1105   06/07/15 1352  cefUROXime (ZINACEF) 1.5 g in dextrose 5 % 50 mL IVPB     1.5 g 100 mL/hr over 30 Minutes Intravenous 30 min pre-op 06/07/15 1352 06/08/15 0800       Virgina Jock, PA-C [facility] Office: [phone] Pager: [phone] 06/11/2015 7:46 AM

## 2015-06-11 NOTE — Significant Event (Signed)
Rapid Response Event Note  Overview: Time Called: 0509 Arrival Time: 0520 Event Type: Neurologic  Initial Focused Assessment:  Called by bedside RN to do NIH. [name] [name] is a 75 y.o. male with history of CAD s/p CABG in Feb, asymptomatic high-grade R ICA stenosis, poor renal Function, hx penectomy who post R CEA developed sudden onset L arm weakness and sensory deficit. He did not receive IV t-PA due to recent surgery. + right brain infarct, likely embolic stroke procedure related s/p R CEA vs. Hypotension induced hypoperfusion in the setting of stenosis of right ICA supraclinoid segment  Interventions: - Q Shift NIH performed, NIH 0  Event Summary: NIH documented

## 2015-06-11 NOTE — Telephone Encounter (Signed)
Sched appt 6/22 at 10. Spoke to pt to inform them of appt.

## 2015-06-11 NOTE — Progress Notes (Signed)
[name] [name] to be D/C'd Home per MD order. Discussed with the patient and all questions fully answered.    VVS, Skin clean, dry and intact without evidence of skin break down, no evidence of skin tears noted.  IV catheter discontinued intact. Site without signs and symptoms of complications. Dressing and pressure applied.  An After Visit Summary was printed and given to the patient.  Patient escorted via [place], and D/C home via private auto.  Cyndra Numbers  06/11/2015 11:10 AM

## 2015-06-17 ENCOUNTER — Encounter: Payer: Medicare HMO | Admitting: Cardiothoracic Surgery

## 2015-06-18 ENCOUNTER — Encounter: Payer: Self-pay | Admitting: Vascular Surgery

## 2015-06-18 DIAGNOSIS — N390 Urinary tract infection, site not specified: Secondary | ICD-10-CM | POA: Diagnosis not present

## 2015-06-18 DIAGNOSIS — R339 Retention of urine, unspecified: Secondary | ICD-10-CM | POA: Diagnosis not present

## 2015-06-18 DIAGNOSIS — N401 Enlarged prostate with lower urinary tract symptoms: Secondary | ICD-10-CM | POA: Diagnosis not present

## 2015-06-18 DIAGNOSIS — A499 Bacterial infection, unspecified: Secondary | ICD-10-CM | POA: Diagnosis not present

## 2015-06-25 ENCOUNTER — Ambulatory Visit (INDEPENDENT_AMBULATORY_CARE_PROVIDER_SITE_OTHER): Payer: Medicare HMO | Admitting: Vascular Surgery

## 2015-06-25 ENCOUNTER — Encounter: Payer: Self-pay | Admitting: Vascular Surgery

## 2015-06-25 VITALS — BP 100/62 | HR 84 | Temp 97.5°F | Resp 16 | Ht 71.0 in | Wt 207.0 lb

## 2015-06-25 DIAGNOSIS — I6521 Occlusion and stenosis of right carotid artery: Secondary | ICD-10-CM

## 2015-06-25 NOTE — Progress Notes (Signed)
She is a 75 year old male who returns for postoperative follow-up today after recent right carotid endarterectomy. This was, again a by minor stroke with some left hand weakness. He also had urinary retention. I'm happy to report that both of these have now completely resolved. He has very subtle if any deficit in the left hand. The urinary catheter that he was discharged home with has been removed. He denies any symptoms of TIA amaurosis or stroke.  Current Outpatient Prescriptions on File Prior to Visit  Medication Sig Dispense Refill  . acetaminophen (TYLENOL) 325 MG tablet Take 1-2 tablets (325-650 mg total) by mouth every 6 (six) hours as needed for mild pain (or temp >/= 101 F). (Patient not taking: Reported on 06/25/2015)    . aspirin 325 MG tablet Take 1 tablet (325 mg total) by mouth daily.    . Cholecalciferol (VITAMIN D3) 2000 UNITS TABS Take 2,000 Units by mouth daily.     . clonazePAM (KLONOPIN) 1 MG tablet Take 1 mg by mouth daily.     . clotrimazole-betamethasone (LOTRISONE) cream Apply 1 application topically as needed (for skin).     Marland Kitchen glimepiride (AMARYL) 2 MG tablet Take 2 mg by mouth 2 (two) times daily.      . isosorbide mononitrate (IMDUR) 30 MG 24 hr tablet Take 30 mg by mouth every other day.    . lovastatin (MEVACOR) 20 MG tablet Take 1 tablet (20 mg total) by mouth at bedtime. 90 tablet 3  . metroNIDAZOLE (METROGEL) 1 % gel Apply 1 application topically daily.      . Multiple Vitamins-Minerals (MULTIVITAMIN ADULT PO) Take 1 tablet by mouth daily.     . nitroGLYCERIN (NITROSTAT) 0.4 MG SL tablet Place 1 tablet (0.4 mg total) under the tongue every 5 (five) minutes as needed. (Patient taking differently: Place 0.4 mg under the tongue every 5 (five) minutes as needed for chest pain. ) 25 tablet 3  . sodium polystyrene (KAYEXALATE) 15 GM/60ML suspension Take 30 g by mouth every 30 (thirty) days.     . tamsulosin (FLOMAX) 0.4 MG CAPS capsule Take 1 capsule (0.4 mg total) by mouth  daily after supper. 30 capsule 0   No current facility-administered medications on file prior to visit.     Physical exam:  Filed Vitals:   06/25/15 1000 06/25/15 1002  BP: 105/58 100/62  Pulse: 84 84  Temp: 97.5 F (36.4 C)   TempSrc: Oral   Resp: 16   Height: 5\' 11"  (1.803 m)   Weight: 207 lb (93.895 kg)   SpO2: 100%     Neck: Well-healed right neck incision  Neuro: Symmetric upper extremity and lower extremity motor strength which is 5 over 5 this includes fine motor coordination movements of the left hand.  Assessment: Doing well status post right carotid endarterectomy  Plan: Follow-up 6 months repeat duplex exam. Continue aspirin

## 2015-07-10 ENCOUNTER — Other Ambulatory Visit: Payer: Self-pay | Admitting: Vascular Surgery

## 2015-07-20 DIAGNOSIS — E1142 Type 2 diabetes mellitus with diabetic polyneuropathy: Secondary | ICD-10-CM | POA: Diagnosis not present

## 2015-07-20 DIAGNOSIS — E7800 Pure hypercholesterolemia, unspecified: Secondary | ICD-10-CM | POA: Diagnosis not present

## 2015-07-20 DIAGNOSIS — I10 Essential (primary) hypertension: Secondary | ICD-10-CM | POA: Diagnosis not present

## 2015-07-20 DIAGNOSIS — Z299 Encounter for prophylactic measures, unspecified: Secondary | ICD-10-CM | POA: Diagnosis not present

## 2015-07-20 DIAGNOSIS — N183 Chronic kidney disease, stage 3 (moderate): Secondary | ICD-10-CM | POA: Diagnosis not present

## 2015-07-23 DIAGNOSIS — N401 Enlarged prostate with lower urinary tract symptoms: Secondary | ICD-10-CM | POA: Diagnosis not present

## 2015-07-23 DIAGNOSIS — R3914 Feeling of incomplete bladder emptying: Secondary | ICD-10-CM | POA: Diagnosis not present

## 2015-07-23 DIAGNOSIS — R351 Nocturia: Secondary | ICD-10-CM | POA: Diagnosis not present

## 2015-08-03 DIAGNOSIS — D649 Anemia, unspecified: Secondary | ICD-10-CM | POA: Diagnosis not present

## 2015-08-03 DIAGNOSIS — E1129 Type 2 diabetes mellitus with other diabetic kidney complication: Secondary | ICD-10-CM | POA: Diagnosis not present

## 2015-08-03 DIAGNOSIS — E875 Hyperkalemia: Secondary | ICD-10-CM | POA: Diagnosis not present

## 2015-08-03 DIAGNOSIS — R809 Proteinuria, unspecified: Secondary | ICD-10-CM | POA: Diagnosis not present

## 2015-08-03 DIAGNOSIS — Z79899 Other long term (current) drug therapy: Secondary | ICD-10-CM | POA: Diagnosis not present

## 2015-08-03 DIAGNOSIS — N184 Chronic kidney disease, stage 4 (severe): Secondary | ICD-10-CM | POA: Diagnosis not present

## 2015-08-03 DIAGNOSIS — I10 Essential (primary) hypertension: Secondary | ICD-10-CM | POA: Diagnosis not present

## 2015-08-04 DIAGNOSIS — H35363 Drusen (degenerative) of macula, bilateral: Secondary | ICD-10-CM | POA: Diagnosis not present

## 2015-08-04 DIAGNOSIS — H35031 Hypertensive retinopathy, right eye: Secondary | ICD-10-CM | POA: Diagnosis not present

## 2015-08-10 ENCOUNTER — Ambulatory Visit: Payer: Medicare HMO | Admitting: Cardiovascular Disease

## 2015-08-11 DIAGNOSIS — E119 Type 2 diabetes mellitus without complications: Secondary | ICD-10-CM | POA: Diagnosis not present

## 2015-08-11 DIAGNOSIS — I2510 Atherosclerotic heart disease of native coronary artery without angina pectoris: Secondary | ICD-10-CM | POA: Diagnosis not present

## 2015-08-11 DIAGNOSIS — I10 Essential (primary) hypertension: Secondary | ICD-10-CM | POA: Diagnosis not present

## 2015-08-12 DIAGNOSIS — D638 Anemia in other chronic diseases classified elsewhere: Secondary | ICD-10-CM | POA: Diagnosis not present

## 2015-08-12 DIAGNOSIS — R809 Proteinuria, unspecified: Secondary | ICD-10-CM | POA: Diagnosis not present

## 2015-08-12 DIAGNOSIS — E875 Hyperkalemia: Secondary | ICD-10-CM | POA: Diagnosis not present

## 2015-08-12 DIAGNOSIS — N184 Chronic kidney disease, stage 4 (severe): Secondary | ICD-10-CM | POA: Diagnosis not present

## 2015-08-31 DIAGNOSIS — E1322 Other specified diabetes mellitus with diabetic chronic kidney disease: Secondary | ICD-10-CM | POA: Diagnosis not present

## 2015-08-31 DIAGNOSIS — E1365 Other specified diabetes mellitus with hyperglycemia: Secondary | ICD-10-CM | POA: Diagnosis not present

## 2015-08-31 DIAGNOSIS — I2510 Atherosclerotic heart disease of native coronary artery without angina pectoris: Secondary | ICD-10-CM | POA: Diagnosis not present

## 2015-08-31 DIAGNOSIS — N183 Chronic kidney disease, stage 3 (moderate): Secondary | ICD-10-CM | POA: Diagnosis not present

## 2015-08-31 DIAGNOSIS — E1121 Type 2 diabetes mellitus with diabetic nephropathy: Secondary | ICD-10-CM | POA: Diagnosis not present

## 2015-09-11 ENCOUNTER — Encounter: Payer: Self-pay | Admitting: *Deleted

## 2015-09-15 ENCOUNTER — Encounter: Payer: Self-pay | Admitting: Cardiovascular Disease

## 2015-09-15 ENCOUNTER — Ambulatory Visit (INDEPENDENT_AMBULATORY_CARE_PROVIDER_SITE_OTHER): Payer: Medicare HMO | Admitting: Cardiovascular Disease

## 2015-09-15 VITALS — BP 118/70 | HR 75 | Ht 71.0 in | Wt 215.0 lb

## 2015-09-15 DIAGNOSIS — Z9889 Other specified postprocedural states: Secondary | ICD-10-CM

## 2015-09-15 DIAGNOSIS — Z951 Presence of aortocoronary bypass graft: Secondary | ICD-10-CM

## 2015-09-15 DIAGNOSIS — E785 Hyperlipidemia, unspecified: Secondary | ICD-10-CM | POA: Diagnosis not present

## 2015-09-15 DIAGNOSIS — I25709 Atherosclerosis of coronary artery bypass graft(s), unspecified, with unspecified angina pectoris: Secondary | ICD-10-CM

## 2015-09-15 DIAGNOSIS — I10 Essential (primary) hypertension: Secondary | ICD-10-CM

## 2015-09-15 MED ORDER — ASPIRIN EC 81 MG PO TBEC: 81.0000 mg | DELAYED_RELEASE_TABLET | Freq: Every day | ORAL | Status: AC

## 2015-09-15 NOTE — Progress Notes (Signed)
SUBJECTIVE: Pt returns for f/u of CAD/CABG. Underwent right CEA in June. The patient denies any symptoms of chest pain, palpitations, shortness of breath, lightheadedness, dizziness, leg swelling, orthopnea, PND, and syncope.    Review of Systems: As per "subjective", otherwise negative.  No Known Allergies  Current Outpatient Prescriptions  Medication Sig Dispense Refill  . acetaminophen (TYLENOL) 325 MG tablet Take 1-2 tablets (325-650 mg total) by mouth every 6 (six) hours as needed for mild pain (or temp >/= 101 F).    Marland Kitchen aspirin 325 MG tablet Take 1 tablet (325 mg total) by mouth daily.    . Cholecalciferol (VITAMIN D3) 2000 UNITS TABS Take 2,000 Units by mouth daily.     . clonazePAM (KLONOPIN) 1 MG tablet Take 1 mg by mouth daily.     . clotrimazole-betamethasone (LOTRISONE) cream Apply 1 application topically as needed (for skin).     Marland Kitchen glimepiride (AMARYL) 2 MG tablet Take 2 mg by mouth 2 (two) times daily.      . isosorbide mononitrate (IMDUR) 30 MG 24 hr tablet Take 30 mg by mouth every other day.    . lovastatin (MEVACOR) 20 MG tablet Take 1 tablet (20 mg total) by mouth at bedtime. 90 tablet 3  . metroNIDAZOLE (METROGEL) 1 % gel Apply 1 application topically daily.      . Multiple Vitamins-Minerals (MULTIVITAMIN ADULT PO) Take 1 tablet by mouth daily.     . nitroGLYCERIN (NITROSTAT) 0.4 MG SL tablet Place 1 tablet (0.4 mg total) under the tongue every 5 (five) minutes as needed. (Patient taking differently: Place 0.4 mg under the tongue every 5 (five) minutes as needed for chest pain. ) 25 tablet 3  . sodium polystyrene (KAYEXALATE) 15 GM/60ML suspension Take 30 g by mouth every 30 (thirty) days.      No current facility-administered medications for this visit.     Past Medical History:  Diagnosis Date  . Abnormal nuclear stress test   . Arthritis   . Chronic kidney disease    chronic renal insufficiency,single functional kidney  . Coronary artery disease    quiescent on medical therapy,abnormal exercise Cardiolite suggestive of apical ischemia;EF 54%,02/10  . Diabetes mellitus without complication (HCC)    Type 2  . Gout   . Hyperlipidemia    abnormal LFT's,resolved,secondary to lipitor  . Hypertension   . 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 Peripheral vascular disease (HCC)    carotid artery blockage  . Shortness of breath dyspnea    with exertion  . Tobacco abuse     Past Surgical History:  Procedure Laterality Date  . BLADDER SURGERY  1980  . CARDIAC CATHETERIZATION N/A 03/02/2015   Procedure: Left Heart Cath and Coronary Angiography;  Surgeon: Burnell Blanks, MD;  Location: [place] [facility];  Service: Cardiovascular;  Laterality: N/A;  . CARDIAC CATHETERIZATION N/A 03/02/2015   Procedure: Intravascular Ultrasound/IVUS;  Surgeon: Burnell Blanks, MD;  Location: [place] [facility];  Service: Cardiovascular;  Laterality: N/A;  . CORONARY ARTERY BYPASS GRAFT N/A 03/10/2015   Procedure: CORONARY ARTERY BYPASS GRAFTING (CABG) x four  , using left internal mammary artery and bilateral greater saphenous veins harvested endoscopically;  Surgeon: Ivin Poot, MD;  Location: [place];  Service: Open Heart Surgery;  Laterality: N/A;  . ENDARTERECTOMY Right 06/08/2015   Procedure: ENDARTERECTOMY CAROTID;  Surgeon: Elam Dutch, MD;  Location: [place];  Service: Vascular;  Laterality:  Right;  Marland Kitchen PENECTOMY     surgery for carcinoma of penis  . 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 TEE WITHOUT CARDIOVERSION N/A 03/10/2015   Procedure: TRANSESOPHAGEAL ECHOCARDIOGRAM (TEE);  Surgeon: Ivin Poot, MD;  Location: [place];  Service: Open Heart Surgery;  Laterality: N/A;    Social History   Social History  . Marital status: Widowed    Spouse name: N/A  . Number of children: N/A  . Years of education: N/A   Occupational History  . Not on file.   Social History Main Topics  . Smoking  status: Former Smoker    Packs/day: 1.00    Years: 25.00    Types: Cigarettes    Start date: 01/04/1955    Quit date: 01/03/1970  . Smokeless tobacco: Never Used  . Alcohol use No  . Drug use: No  . Sexual activity: Not on file   Other Topics Concern  . Not on file   Social History Narrative  . No narrative on file     Vitals:   09/15/15 1553  BP: 118/70  Pulse: 75  SpO2: 96%  Weight: 215 lb (97.5 kg)  Height: 5\' 11"  (1.803 m)    PHYSICAL EXAM General: NAD HEENT: Normal. Neck: No JVD, no thyromegaly. Lungs: Clear to auscultation bilaterally with normal respiratory effort. CV: Nondisplaced PMI.  Regular rate and rhythm, normal S1/S2, no S3/S4, no murmur. No pretibial or periankle edema.    Abdomen: Soft, nontender, no distention.  Neurologic: Alert and oriented.  Psych: Normal affect. Skin: Normal. Musculoskeletal: No gross deformities.    ECG: Most recent ECG reviewed.      ASSESSMENT AND PLAN: 1. CAD s/p CABG: On ASA, Imdur, and statin. Will dc Imdur and reduce ASA to 81 mg.  2. Essential HTN: BP normal today.  3. Hyperlipidemia: Continue statin.  4. High-grade asymptomatic right internal carotid artery stenosis s/p CEA 06/2015: Follows with vascular.   Dispo: fu 1 year.  Kate Sable, M.D., F.A.C.C.

## 2015-09-15 NOTE — Patient Instructions (Signed)
Medication Instructions:   Stop Imdur (Isosorbide).  Decrease Aspirin to 81mg  daily.  Continue all other medications.    Labwork: none  Testing/Procedures: none  Follow-Up: Your physician wants you to follow up in:  1 year.  You will receive a reminder letter in the mail one-two months in advance.  If you don't receive a letter, please call our office to schedule the follow up appointment   Any Other Special Instructions Will Be Listed Below (If Applicable).  If you need a refill on your cardiac medications before your next appointment, please call your pharmacy.

## 2015-10-05 DIAGNOSIS — Z23 Encounter for immunization: Secondary | ICD-10-CM | POA: Diagnosis not present

## 2015-11-30 DIAGNOSIS — Z7189 Other specified counseling: Secondary | ICD-10-CM | POA: Diagnosis not present

## 2015-11-30 DIAGNOSIS — Z125 Encounter for screening for malignant neoplasm of prostate: Secondary | ICD-10-CM | POA: Diagnosis not present

## 2015-11-30 DIAGNOSIS — R5383 Other fatigue: Secondary | ICD-10-CM | POA: Diagnosis not present

## 2015-11-30 DIAGNOSIS — Z0000 Encounter for general adult medical examination without abnormal findings: Secondary | ICD-10-CM | POA: Diagnosis not present

## 2015-11-30 DIAGNOSIS — Z6832 Body mass index (BMI) 32.0-32.9, adult: Secondary | ICD-10-CM | POA: Diagnosis not present

## 2015-11-30 DIAGNOSIS — E7800 Pure hypercholesterolemia, unspecified: Secondary | ICD-10-CM | POA: Diagnosis not present

## 2015-11-30 DIAGNOSIS — Z1389 Encounter for screening for other disorder: Secondary | ICD-10-CM | POA: Diagnosis not present

## 2015-11-30 DIAGNOSIS — Z299 Encounter for prophylactic measures, unspecified: Secondary | ICD-10-CM | POA: Diagnosis not present

## 2015-11-30 DIAGNOSIS — Z1211 Encounter for screening for malignant neoplasm of colon: Secondary | ICD-10-CM | POA: Diagnosis not present

## 2015-11-30 DIAGNOSIS — Z79899 Other long term (current) drug therapy: Secondary | ICD-10-CM | POA: Diagnosis not present

## 2015-12-25 ENCOUNTER — Encounter: Payer: Self-pay | Admitting: Vascular Surgery

## 2015-12-31 ENCOUNTER — Encounter (HOSPITAL_COMMUNITY): Payer: Medicare HMO

## 2015-12-31 ENCOUNTER — Ambulatory Visit: Payer: Medicare HMO | Admitting: Vascular Surgery

## 2016-01-05 DIAGNOSIS — Z79899 Other long term (current) drug therapy: Secondary | ICD-10-CM | POA: Diagnosis not present

## 2016-01-05 DIAGNOSIS — N183 Chronic kidney disease, stage 3 (moderate): Secondary | ICD-10-CM | POA: Diagnosis not present

## 2016-01-05 DIAGNOSIS — E559 Vitamin D deficiency, unspecified: Secondary | ICD-10-CM | POA: Diagnosis not present

## 2016-01-05 DIAGNOSIS — R809 Proteinuria, unspecified: Secondary | ICD-10-CM | POA: Diagnosis not present

## 2016-01-05 DIAGNOSIS — I10 Essential (primary) hypertension: Secondary | ICD-10-CM | POA: Diagnosis not present

## 2016-01-05 DIAGNOSIS — D509 Iron deficiency anemia, unspecified: Secondary | ICD-10-CM | POA: Diagnosis not present

## 2016-01-07 ENCOUNTER — Ambulatory Visit (HOSPITAL_COMMUNITY)
Admission: RE | Admit: 2016-01-07 | Discharge: 2016-01-07 | Disposition: A | Discharge status: Home / Self Care | Payer: Medicare HMO | Source: Ambulatory Visit | Attending: Vascular Surgery | Admitting: Vascular Surgery

## 2016-01-07 ENCOUNTER — Encounter: Payer: Self-pay | Admitting: Vascular Surgery

## 2016-01-07 ENCOUNTER — Ambulatory Visit (INDEPENDENT_AMBULATORY_CARE_PROVIDER_SITE_OTHER): Payer: Medicare HMO | Admitting: Vascular Surgery

## 2016-01-07 VITALS — BP 127/79 | HR 75 | Temp 97.0°F | Resp 16 | Ht 71.0 in | Wt 215.0 lb

## 2016-01-07 DIAGNOSIS — I6521 Occlusion and stenosis of right carotid artery: Secondary | ICD-10-CM

## 2016-01-07 DIAGNOSIS — I6523 Occlusion and stenosis of bilateral carotid arteries: Secondary | ICD-10-CM | POA: Diagnosis not present

## 2016-01-07 LAB — VAS US CAROTID
LCCADDIAS: -32 cm/s
LCCAPSYS: 150 cm/s
LEFT ECA DIAS: -15 cm/s
LEFT VERTEBRAL DIAS: -27 cm/s
Left CCA dist sys: -112 cm/s
Left CCA prox dias: 33 cm/s
Left ICA dist dias: -38 cm/s
Left ICA dist sys: -108 cm/s
RCCADSYS: -90 cm/s
RCCAPSYS: 124 cm/s
RIGHT CCA MID DIAS: -14 cm/s
RIGHT ECA DIAS: -18 cm/s
Right CCA prox dias: 20 cm/s

## 2016-01-07 NOTE — Progress Notes (Signed)
She is a 76 year old male who returns for ollow-up today after right carotid endarterectomy.  He denies any symptoms of TIA amaurosis or stroke. He does complain of some occasional swelling in his lower extremities. He has previously had the right greater saphenous vein harvested for coronary bypass grafting. He has not previously worn compression stockings. The swelling only occurs on occasion and 8 usually occurs after he is standing in one place for a significant amount of time. He also developed some pain in his legs after standing in the same place for a long period of time. Chronic medical problems include mild renal insufficiency, coronary disease, diabetes, hyperlipidemia, hypertension all of which are currently stable.  He is on aspirin and a statin.  Past Medical History:  Diagnosis Date  . Abnormal nuclear stress test   . Arthritis   . Chronic kidney disease    chronic renal insufficiency,single functional kidney  . Coronary artery disease    quiescent on medical therapy,abnormal exercise Cardiolite suggestive of apical ischemia;EF 54%,02/10  . Diabetes mellitus without complication (HCC)    Type 2  . Gout   . Hyperlipidemia    abnormal LFT's,resolved,secondary to lipitor  . Hypertension   . 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 Peripheral vascular disease (HCC)    carotid artery blockage  . Shortness of breath dyspnea    with exertion  . Tobacco abuse       Current Outpatient Prescriptions on File Prior to Visit  Medication Sig Dispense Refill  . acetaminophen (TYLENOL) 325 MG tablet Take 1-2 tablets (325-650 mg total) by mouth every 6 (six) hours as needed for mild pain (or temp >/= 101 F).    Marland Kitchen aspirin EC 81 MG tablet Take 1 tablet (81 mg total) by mouth daily.    . Cholecalciferol (VITAMIN D3) 2000 UNITS TABS Take 2,000 Units by mouth daily.     . clonazePAM (KLONOPIN) 1 MG tablet Take 1 mg by mouth daily.     . clotrimazole-betamethasone  (LOTRISONE) cream Apply 1 application topically as needed (for skin).     Marland Kitchen glimepiride (AMARYL) 2 MG tablet Take 2 mg by mouth 2 (two) times daily.      Marland Kitchen lovastatin (MEVACOR) 20 MG tablet Take 1 tablet (20 mg total) by mouth at bedtime. 90 tablet 3  . metroNIDAZOLE (METROGEL) 1 % gel Apply 1 application topically daily.      . Multiple Vitamins-Minerals (MULTIVITAMIN ADULT PO) Take 1 tablet by mouth daily.     . nitroGLYCERIN (NITROSTAT) 0.4 MG SL tablet Place 1 tablet (0.4 mg total) under the tongue every 5 (five) minutes as needed. (Patient taking differently: Place 0.4 mg under the tongue every 5 (five) minutes as needed for chest pain. ) 25 tablet 3  . sodium polystyrene (KAYEXALATE) 15 GM/60ML suspension Take 30 g by mouth every 30 (thirty) days.      No current facility-administered medications on file prior to visit.     Review of systems: He denies shortness of breath. He denies chest pain.   Physical exam:   Vitals:   01/07/16 1340 01/07/16 1342  BP: 133/84 127/79  Pulse: 75   Resp: 16   Temp: 97 F (36.1 C)   TempSrc: Oral   SpO2: 99%   Weight: 215 lb (97.5 kg)   Height: 5\' 11"  (1.803 m)     Neck: Well-healed right neck incision   Neuro: Symmetric upper extremity and lower extremity motor strength  which is 5 over 5 this includes fine motor coordination movements of the left hand.  Extremities: Trace edema bilaterally. 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 No palpable pedal pulses 2+ femoral pulses  Data: Patient had a carotid duplex exam today which showed no recurrent stenosis on the right side. Less than 40% left internal carotid artery stenosis antegrade vertebral flow.   Assessment: Doing well status post right carotid endarterectomy.     Plan: Follow-up 12 months repeat duplex exam. Continue aspirin/statin. Patient was advised he can wear a compression stocking on his lower extremities if he has continued  swelling and problems with pain in his legs with standing for long periods of time.  [name], MD [facility] Office: [phone] Pager: [phone]

## 2016-01-12 NOTE — Addendum Note (Signed)
Addended by: Lianne Cure A on: 01/12/2016 03:35 PM   Modules accepted: Orders

## 2016-01-13 DIAGNOSIS — N250 Renal osteodystrophy: Secondary | ICD-10-CM | POA: Diagnosis not present

## 2016-01-13 DIAGNOSIS — R809 Proteinuria, unspecified: Secondary | ICD-10-CM | POA: Diagnosis not present

## 2016-01-13 DIAGNOSIS — E875 Hyperkalemia: Secondary | ICD-10-CM | POA: Diagnosis not present

## 2016-01-13 DIAGNOSIS — N184 Chronic kidney disease, stage 4 (severe): Secondary | ICD-10-CM | POA: Diagnosis not present

## 2016-02-29 DIAGNOSIS — E7800 Pure hypercholesterolemia, unspecified: Secondary | ICD-10-CM | POA: Diagnosis not present

## 2016-02-29 DIAGNOSIS — L719 Rosacea, unspecified: Secondary | ICD-10-CM | POA: Diagnosis not present

## 2016-02-29 DIAGNOSIS — Z299 Encounter for prophylactic measures, unspecified: Secondary | ICD-10-CM | POA: Diagnosis not present

## 2016-02-29 DIAGNOSIS — Z6832 Body mass index (BMI) 32.0-32.9, adult: Secondary | ICD-10-CM | POA: Diagnosis not present

## 2016-02-29 DIAGNOSIS — Z713 Dietary counseling and surveillance: Secondary | ICD-10-CM | POA: Diagnosis not present

## 2016-02-29 DIAGNOSIS — M109 Gout, unspecified: Secondary | ICD-10-CM | POA: Diagnosis not present

## 2016-02-29 DIAGNOSIS — I25810 Atherosclerosis of coronary artery bypass graft(s) without angina pectoris: Secondary | ICD-10-CM | POA: Diagnosis not present

## 2016-02-29 DIAGNOSIS — E1142 Type 2 diabetes mellitus with diabetic polyneuropathy: Secondary | ICD-10-CM | POA: Diagnosis not present

## 2016-02-29 DIAGNOSIS — I10 Essential (primary) hypertension: Secondary | ICD-10-CM | POA: Diagnosis not present

## 2016-02-29 DIAGNOSIS — N183 Chronic kidney disease, stage 3 (moderate): Secondary | ICD-10-CM | POA: Diagnosis not present

## 2016-05-03 DIAGNOSIS — E559 Vitamin D deficiency, unspecified: Secondary | ICD-10-CM | POA: Diagnosis not present

## 2016-05-03 DIAGNOSIS — D509 Iron deficiency anemia, unspecified: Secondary | ICD-10-CM | POA: Diagnosis not present

## 2016-05-03 DIAGNOSIS — Z79899 Other long term (current) drug therapy: Secondary | ICD-10-CM | POA: Diagnosis not present

## 2016-05-03 DIAGNOSIS — I10 Essential (primary) hypertension: Secondary | ICD-10-CM | POA: Diagnosis not present

## 2016-05-03 DIAGNOSIS — R809 Proteinuria, unspecified: Secondary | ICD-10-CM | POA: Diagnosis not present

## 2016-05-03 DIAGNOSIS — N183 Chronic kidney disease, stage 3 (moderate): Secondary | ICD-10-CM | POA: Diagnosis not present

## 2016-05-11 DIAGNOSIS — N184 Chronic kidney disease, stage 4 (severe): Secondary | ICD-10-CM | POA: Diagnosis not present

## 2016-05-11 DIAGNOSIS — D649 Anemia, unspecified: Secondary | ICD-10-CM | POA: Diagnosis not present

## 2016-05-11 DIAGNOSIS — R809 Proteinuria, unspecified: Secondary | ICD-10-CM | POA: Diagnosis not present

## 2016-05-11 DIAGNOSIS — N250 Renal osteodystrophy: Secondary | ICD-10-CM | POA: Diagnosis not present

## 2016-05-11 DIAGNOSIS — E1129 Type 2 diabetes mellitus with other diabetic kidney complication: Secondary | ICD-10-CM | POA: Diagnosis not present

## 2016-05-11 DIAGNOSIS — E875 Hyperkalemia: Secondary | ICD-10-CM | POA: Diagnosis not present

## 2016-05-11 DIAGNOSIS — I10 Essential (primary) hypertension: Secondary | ICD-10-CM | POA: Diagnosis not present

## 2016-05-31 DIAGNOSIS — N183 Chronic kidney disease, stage 3 (moderate): Secondary | ICD-10-CM | POA: Diagnosis not present

## 2016-05-31 DIAGNOSIS — I25810 Atherosclerosis of coronary artery bypass graft(s) without angina pectoris: Secondary | ICD-10-CM | POA: Diagnosis not present

## 2016-05-31 DIAGNOSIS — I10 Essential (primary) hypertension: Secondary | ICD-10-CM | POA: Diagnosis not present

## 2016-05-31 DIAGNOSIS — Z6832 Body mass index (BMI) 32.0-32.9, adult: Secondary | ICD-10-CM | POA: Diagnosis not present

## 2016-05-31 DIAGNOSIS — E1165 Type 2 diabetes mellitus with hyperglycemia: Secondary | ICD-10-CM | POA: Diagnosis not present

## 2016-05-31 DIAGNOSIS — E1142 Type 2 diabetes mellitus with diabetic polyneuropathy: Secondary | ICD-10-CM | POA: Diagnosis not present

## 2016-05-31 DIAGNOSIS — E1122 Type 2 diabetes mellitus with diabetic chronic kidney disease: Secondary | ICD-10-CM | POA: Diagnosis not present

## 2016-05-31 DIAGNOSIS — R69 Illness, unspecified: Secondary | ICD-10-CM | POA: Diagnosis not present

## 2016-05-31 DIAGNOSIS — Z299 Encounter for prophylactic measures, unspecified: Secondary | ICD-10-CM | POA: Diagnosis not present

## 2016-05-31 DIAGNOSIS — Z87891 Personal history of nicotine dependence: Secondary | ICD-10-CM | POA: Diagnosis not present

## 2016-08-23 ENCOUNTER — Other Ambulatory Visit: Payer: Self-pay | Admitting: Cardiovascular Disease

## 2016-09-06 DIAGNOSIS — E1122 Type 2 diabetes mellitus with diabetic chronic kidney disease: Secondary | ICD-10-CM | POA: Diagnosis not present

## 2016-09-06 DIAGNOSIS — Z299 Encounter for prophylactic measures, unspecified: Secondary | ICD-10-CM | POA: Diagnosis not present

## 2016-09-06 DIAGNOSIS — Z87891 Personal history of nicotine dependence: Secondary | ICD-10-CM | POA: Diagnosis not present

## 2016-09-06 DIAGNOSIS — E1142 Type 2 diabetes mellitus with diabetic polyneuropathy: Secondary | ICD-10-CM | POA: Diagnosis not present

## 2016-09-06 DIAGNOSIS — I10 Essential (primary) hypertension: Secondary | ICD-10-CM | POA: Diagnosis not present

## 2016-09-06 DIAGNOSIS — E1165 Type 2 diabetes mellitus with hyperglycemia: Secondary | ICD-10-CM | POA: Diagnosis not present

## 2016-09-06 DIAGNOSIS — Z6833 Body mass index (BMI) 33.0-33.9, adult: Secondary | ICD-10-CM | POA: Diagnosis not present

## 2016-09-06 DIAGNOSIS — N183 Chronic kidney disease, stage 3 (moderate): Secondary | ICD-10-CM | POA: Diagnosis not present

## 2016-09-06 DIAGNOSIS — I25810 Atherosclerosis of coronary artery bypass graft(s) without angina pectoris: Secondary | ICD-10-CM | POA: Diagnosis not present

## 2016-09-06 DIAGNOSIS — E669 Obesity, unspecified: Secondary | ICD-10-CM | POA: Diagnosis not present

## 2016-09-14 DIAGNOSIS — N183 Chronic kidney disease, stage 3 (moderate): Secondary | ICD-10-CM | POA: Diagnosis not present

## 2016-09-14 DIAGNOSIS — I10 Essential (primary) hypertension: Secondary | ICD-10-CM | POA: Diagnosis not present

## 2016-09-14 DIAGNOSIS — E559 Vitamin D deficiency, unspecified: Secondary | ICD-10-CM | POA: Diagnosis not present

## 2016-09-14 DIAGNOSIS — D509 Iron deficiency anemia, unspecified: Secondary | ICD-10-CM | POA: Diagnosis not present

## 2016-09-14 DIAGNOSIS — R809 Proteinuria, unspecified: Secondary | ICD-10-CM | POA: Diagnosis not present

## 2016-09-14 DIAGNOSIS — Z79899 Other long term (current) drug therapy: Secondary | ICD-10-CM | POA: Diagnosis not present

## 2016-09-18 DIAGNOSIS — D649 Anemia, unspecified: Secondary | ICD-10-CM | POA: Diagnosis not present

## 2016-09-18 DIAGNOSIS — E875 Hyperkalemia: Secondary | ICD-10-CM | POA: Diagnosis not present

## 2016-09-18 DIAGNOSIS — E1129 Type 2 diabetes mellitus with other diabetic kidney complication: Secondary | ICD-10-CM | POA: Diagnosis not present

## 2016-09-18 DIAGNOSIS — R809 Proteinuria, unspecified: Secondary | ICD-10-CM | POA: Diagnosis not present

## 2016-09-18 DIAGNOSIS — N184 Chronic kidney disease, stage 4 (severe): Secondary | ICD-10-CM | POA: Diagnosis not present

## 2016-09-18 DIAGNOSIS — I10 Essential (primary) hypertension: Secondary | ICD-10-CM | POA: Diagnosis not present

## 2016-09-23 ENCOUNTER — Encounter: Payer: Self-pay | Admitting: Cardiovascular Disease

## 2016-09-23 ENCOUNTER — Ambulatory Visit (INDEPENDENT_AMBULATORY_CARE_PROVIDER_SITE_OTHER): Payer: Medicare HMO | Admitting: Cardiovascular Disease

## 2016-09-23 VITALS — BP 138/80 | HR 72 | Ht 71.0 in | Wt 212.0 lb

## 2016-09-23 DIAGNOSIS — I25708 Atherosclerosis of coronary artery bypass graft(s), unspecified, with other forms of angina pectoris: Secondary | ICD-10-CM | POA: Diagnosis not present

## 2016-09-23 DIAGNOSIS — Z9889 Other specified postprocedural states: Secondary | ICD-10-CM

## 2016-09-23 DIAGNOSIS — I10 Essential (primary) hypertension: Secondary | ICD-10-CM | POA: Diagnosis not present

## 2016-09-23 DIAGNOSIS — E785 Hyperlipidemia, unspecified: Secondary | ICD-10-CM

## 2016-09-23 NOTE — Patient Instructions (Signed)

## 2016-09-23 NOTE — Progress Notes (Signed)
SUBJECTIVE: The patient presents for follow-up of coronary artery disease with a history of CABG. He underwent right carotid endarterectomy in June 2018.  ECG performed in the office today which I ordered and personally interpreted demonstrates normal sinus rhythm with no ischemic ST segment or T-wave abnormalities, nor any arrhythmias.  The patient denies any symptoms of chest pain, palpitations, shortness of breath, lightheadedness, dizziness, leg swelling, orthopnea, PND, and syncope.  He notices some bilateral leg weakness which is mild at the end of the day. He said he does some walking at [facility] but does not do as much exercise as he should. He also says he doesn't drink enough water and his nephrologist told him to do so.   Review of Systems: As per "subjective", otherwise negative.  No Known Allergies  Current Outpatient Prescriptions  Medication Sig Dispense Refill  . acetaminophen (TYLENOL) 325 MG tablet Take 1-2 tablets (325-650 mg total) by mouth every 6 (six) hours as needed for mild pain (or temp >/= 101 F).    Marland Kitchen aspirin EC 81 MG tablet Take 1 tablet (81 mg total) by mouth daily.    . Cholecalciferol (VITAMIN D3) 2000 UNITS TABS Take 2,000 Units by mouth daily.     . clonazePAM (KLONOPIN) 1 MG tablet Take 1 mg by mouth daily.     . clotrimazole-betamethasone (LOTRISONE) cream Apply 1 application topically as needed (for skin).     Marland Kitchen doxycycline (PERIOSTAT) 20 MG tablet Take 20 mg by mouth daily as needed.    Marland Kitchen glimepiride (AMARYL) 2 MG tablet Take 2 mg by mouth 2 (two) times daily.      Marland Kitchen lovastatin (MEVACOR) 20 MG tablet TAKE ONE TABLET BY MOUTH AT BEDTIME 90 tablet 0  . metroNIDAZOLE (METROGEL) 1 % gel Apply 1 application topically daily.      . Multiple Vitamins-Minerals (MULTIVITAMIN ADULT PO) Take 1 tablet by mouth daily.     . nitroGLYCERIN (NITROSTAT) 0.4 MG SL tablet Place 1 tablet (0.4 mg total) under the tongue every 5 (five) minutes as needed. (Patient  taking differently: Place 0.4 mg under the tongue every 5 (five) minutes as needed for chest pain. ) 25 tablet 3  . sodium polystyrene (KAYEXALATE) 15 GM/60ML suspension Take 30 g by mouth every 30 (thirty) days.      No current facility-administered medications for this visit.     Past Medical History:  Diagnosis Date  . Abnormal nuclear stress test   . Arthritis   . Chronic kidney disease    chronic renal insufficiency,single functional kidney  . Coronary artery disease    quiescent on medical therapy,abnormal exercise Cardiolite suggestive of apical ischemia;EF 54%,02/10  . Diabetes mellitus without complication (HCC)    Type 2  . Gout   . Hyperlipidemia    abnormal LFT's,resolved,secondary to lipitor  . Hypertension   . 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 Peripheral vascular disease (HCC)    carotid artery blockage  . Shortness of breath dyspnea    with exertion  . Tobacco abuse     Past Surgical History:  Procedure Laterality Date  . BLADDER SURGERY  1980  . CARDIAC CATHETERIZATION N/A 03/02/2015   Procedure: Left Heart Cath and Coronary Angiography;  Surgeon: Burnell Blanks, MD;  Location: [place] [facility];  Service: Cardiovascular;  Laterality: N/A;  . CARDIAC CATHETERIZATION N/A 03/02/2015   Procedure: Intravascular Ultrasound/IVUS;  Surgeon: Burnell Blanks, MD;  Location:  [facility];  Service: Cardiovascular;  Laterality: N/A;  . CORONARY ARTERY BYPASS GRAFT N/A 03/10/2015   Procedure: CORONARY ARTERY BYPASS GRAFTING (CABG) x four  , using left internal mammary artery and bilateral greater saphenous veins harvested endoscopically;  Surgeon: Ivin Poot, MD;  Location: [place];  Service: Open Heart Surgery;  Laterality: N/A;  . ENDARTERECTOMY Right 06/08/2015   Procedure: ENDARTERECTOMY CAROTID;  Surgeon: Elam Dutch, MD;  Location: [place];  Service: Vascular;  Laterality: Right;  . PENECTOMY     surgery for  carcinoma of penis  . 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 TEE WITHOUT CARDIOVERSION N/A 03/10/2015   Procedure: TRANSESOPHAGEAL ECHOCARDIOGRAM (TEE);  Surgeon: Ivin Poot, MD;  Location: [place];  Service: Open Heart Surgery;  Laterality: N/A;    Social History   Social History  . Marital status: Widowed    Spouse name: N/A  . Number of children: N/A  . Years of education: N/A   Occupational History  . Not on file.   Social History Main Topics  . Smoking status: Former Smoker    Packs/day: 1.00    Years: 25.00    Types: Cigarettes    Start date: 01/04/1955    Quit date: 01/03/1970  . Smokeless tobacco: Never Used  . Alcohol use No  . Drug use: No  . Sexual activity: Not on file   Other Topics Concern  . Not on file   Social History Narrative  . No narrative on file     Vitals:   09/23/16 1047  BP: 138/80  Pulse: 72  SpO2: 98%  Weight: 212 lb (96.2 kg)  Height: 5\' 11"  (1.803 m)    Wt Readings from Last 3 Encounters:  09/23/16 212 lb (96.2 kg)  01/07/16 215 lb (97.5 kg)  09/15/15 215 lb (97.5 kg)     PHYSICAL EXAM General: NAD HEENT: Normal. Neck: No JVD, no thyromegaly. Lungs: Clear to auscultation bilaterally with normal respiratory effort. CV: Nondisplaced PMI.  Regular rate and rhythm, normal S1/S2, no S3/S4, no murmur. No pretibial or periankle edema.  No carotid bruit.   Abdomen: Soft, nontender, no distention.  Neurologic: Alert and oriented.  Psych: Normal affect. Skin: Normal. Musculoskeletal: No gross deformities.    ECG: Most recent ECG reviewed.   Labs: Lab Results  Component Value Date/Time   K 5.0 06/09/2015 05:35 AM   BUN 37 (H) 06/09/2015 05:35 AM   CREATININE 2.49 (H) 06/09/2015 05:35 AM   ALT 19 06/08/2015 06:43 AM   TSH 1.532 03/03/2015 05:52 AM   HGB 11.0 (L) 06/09/2015 05:35 AM     Lipids: Lab Results  Component Value Date/Time   LDLCALC 59 06/09/2015 05:35 AM   CHOL 135 06/09/2015 05:35 AM    TRIG 95 06/09/2015 05:35 AM   HDL 57 06/09/2015 05:35 AM       ASSESSMENT AND PLAN:  1. CAD s/p CABG: Symptomatically stable on aspirin and statin. No changes to therapy.  2. Essential HTN: Controlled. No changes to therapy.  3. Hyperlipidemia: Continue statin. I will obtain a copy of lipids from PCP.  4. High-grade asymptomatic right internal carotid artery stenosis s/p CEA 06/2015:  continue aspirin and statin. Follows with vascular surgery.     Disposition: Follow up 1 year.   Kate Sable, M.D., F.A.C.C.

## 2016-10-03 DIAGNOSIS — R69 Illness, unspecified: Secondary | ICD-10-CM | POA: Diagnosis not present

## 2016-10-05 DIAGNOSIS — H524 Presbyopia: Secondary | ICD-10-CM | POA: Diagnosis not present

## 2016-10-25 ENCOUNTER — Other Ambulatory Visit: Payer: Self-pay | Admitting: Cardiovascular Disease

## 2016-11-16 DIAGNOSIS — N183 Chronic kidney disease, stage 3 (moderate): Secondary | ICD-10-CM | POA: Diagnosis not present

## 2016-11-16 DIAGNOSIS — I10 Essential (primary) hypertension: Secondary | ICD-10-CM | POA: Diagnosis not present

## 2016-11-16 DIAGNOSIS — E559 Vitamin D deficiency, unspecified: Secondary | ICD-10-CM | POA: Diagnosis not present

## 2016-11-16 DIAGNOSIS — R809 Proteinuria, unspecified: Secondary | ICD-10-CM | POA: Diagnosis not present

## 2016-11-16 DIAGNOSIS — D509 Iron deficiency anemia, unspecified: Secondary | ICD-10-CM | POA: Diagnosis not present

## 2016-11-16 DIAGNOSIS — Z79899 Other long term (current) drug therapy: Secondary | ICD-10-CM | POA: Diagnosis not present

## 2016-11-23 DIAGNOSIS — E875 Hyperkalemia: Secondary | ICD-10-CM | POA: Diagnosis not present

## 2016-11-23 DIAGNOSIS — N184 Chronic kidney disease, stage 4 (severe): Secondary | ICD-10-CM | POA: Diagnosis not present

## 2016-11-23 DIAGNOSIS — I10 Essential (primary) hypertension: Secondary | ICD-10-CM | POA: Diagnosis not present

## 2016-11-23 DIAGNOSIS — E1129 Type 2 diabetes mellitus with other diabetic kidney complication: Secondary | ICD-10-CM | POA: Diagnosis not present

## 2016-11-23 DIAGNOSIS — R809 Proteinuria, unspecified: Secondary | ICD-10-CM | POA: Diagnosis not present

## 2016-12-19 DIAGNOSIS — R5383 Other fatigue: Secondary | ICD-10-CM | POA: Diagnosis not present

## 2016-12-19 DIAGNOSIS — E7800 Pure hypercholesterolemia, unspecified: Secondary | ICD-10-CM | POA: Diagnosis not present

## 2016-12-19 DIAGNOSIS — Z7189 Other specified counseling: Secondary | ICD-10-CM | POA: Diagnosis not present

## 2016-12-19 DIAGNOSIS — Z0000 Encounter for general adult medical examination without abnormal findings: Secondary | ICD-10-CM | POA: Diagnosis not present

## 2016-12-19 DIAGNOSIS — Z1331 Encounter for screening for depression: Secondary | ICD-10-CM | POA: Diagnosis not present

## 2016-12-19 DIAGNOSIS — Z1211 Encounter for screening for malignant neoplasm of colon: Secondary | ICD-10-CM | POA: Diagnosis not present

## 2016-12-19 DIAGNOSIS — Z79899 Other long term (current) drug therapy: Secondary | ICD-10-CM | POA: Diagnosis not present

## 2016-12-19 DIAGNOSIS — E1165 Type 2 diabetes mellitus with hyperglycemia: Secondary | ICD-10-CM | POA: Diagnosis not present

## 2016-12-19 DIAGNOSIS — Z1339 Encounter for screening examination for other mental health and behavioral disorders: Secondary | ICD-10-CM | POA: Diagnosis not present

## 2016-12-19 DIAGNOSIS — Z125 Encounter for screening for malignant neoplasm of prostate: Secondary | ICD-10-CM | POA: Diagnosis not present

## 2016-12-21 DIAGNOSIS — Z125 Encounter for screening for malignant neoplasm of prostate: Secondary | ICD-10-CM | POA: Diagnosis not present

## 2016-12-21 DIAGNOSIS — E7800 Pure hypercholesterolemia, unspecified: Secondary | ICD-10-CM | POA: Diagnosis not present

## 2016-12-21 DIAGNOSIS — R5383 Other fatigue: Secondary | ICD-10-CM | POA: Diagnosis not present

## 2016-12-21 DIAGNOSIS — Z79899 Other long term (current) drug therapy: Secondary | ICD-10-CM | POA: Diagnosis not present

## 2016-12-21 DIAGNOSIS — Z0000 Encounter for general adult medical examination without abnormal findings: Secondary | ICD-10-CM | POA: Diagnosis not present

## 2017-02-02 ENCOUNTER — Ambulatory Visit (INDEPENDENT_AMBULATORY_CARE_PROVIDER_SITE_OTHER): Payer: Medicare HMO | Admitting: Vascular Surgery

## 2017-02-02 ENCOUNTER — Ambulatory Visit (HOSPITAL_COMMUNITY)
Admission: RE | Admit: 2017-02-02 | Discharge: 2017-02-02 | Disposition: A | Discharge status: Home / Self Care | Payer: Medicare HMO | Source: Ambulatory Visit | Attending: Vascular Surgery | Admitting: Vascular Surgery

## 2017-02-02 ENCOUNTER — Encounter: Payer: Self-pay | Admitting: Vascular Surgery

## 2017-02-02 VITALS — BP 131/82 | HR 74 | Temp 96.8°F | Resp 18 | Ht 71.0 in | Wt 217.0 lb

## 2017-02-02 DIAGNOSIS — I6523 Occlusion and stenosis of bilateral carotid arteries: Secondary | ICD-10-CM | POA: Diagnosis present

## 2017-02-02 LAB — VAS US CAROTID
LCCADDIAS: -31 cm/s
LEFT ECA DIAS: -12 cm/s
LEFT VERTEBRAL DIAS: -25 cm/s
LICADDIAS: -39 cm/s
LICAPDIAS: -44 cm/s
LICAPSYS: -130 cm/s
Left CCA dist sys: -118 cm/s
Left CCA prox dias: 34 cm/s
Left CCA prox sys: 142 cm/s
Left ICA dist sys: -116 cm/s
RIGHT CCA MID DIAS: 18 cm/s
RIGHT ECA DIAS: -13 cm/s
RIGHT VERTEBRAL DIAS: -21 cm/s
Right CCA prox dias: 21 cm/s
Right CCA prox sys: 129 cm/s
Right cca dist sys: -88 cm/s

## 2017-02-02 NOTE — Progress Notes (Signed)
Patient is a 77 year old male who returns for follow-up today.  He previously underwent right carotid endarterectomy June 2017.  He remains on aspirin and his statin.  He denies any symptoms of TIA amaurosis or stroke over the last year.  Review of systems: He denies shortness of breath.  He denies chest pain.  Physical exam:  Vitals:   02/02/17 1203 02/02/17 1204  BP: 134/78 131/82  Pulse: 74   Resp: 18   Temp: (!) 96.8 F (36 C)   TempSrc: Oral   SpO2: 100%   Weight: 217 lb (98.4 kg)   Height: 5\' 11"  (1.803 m)     Neck: No carotid bruits  Chest: Clear to auscultation bilaterally  Cardiac: Regular rate and rhythm without murmur  Neuro: Symmetric upper extremity lower extremity motor strength 5/5  Data: Patient had a repeat carotid duplex exam today.  Right side showed no recurrent stenosis.  Left side showed 40-60% stenosis.  I reviewed and interpreted the study.  Assessment: Doing well status post right carotid endarterectomy.  Mild left internal carotid artery stenosis currently asymptomatic.  Plan: Continue aspirin statin.  Patient will have a follow-up duplex exam in 1 year.  [name], MD [facility] Office: [phone] Pager: [phone]

## 2017-04-13 IMAGING — DX DG CHEST 2V
2 series · 2 of 2 positions shown · non-contrast
Comparison: None in PACs

CLINICAL DATA: Preoperative examination prior to and chest surgery.

EXAM:
CHEST  2 VIEW

[chest pa]
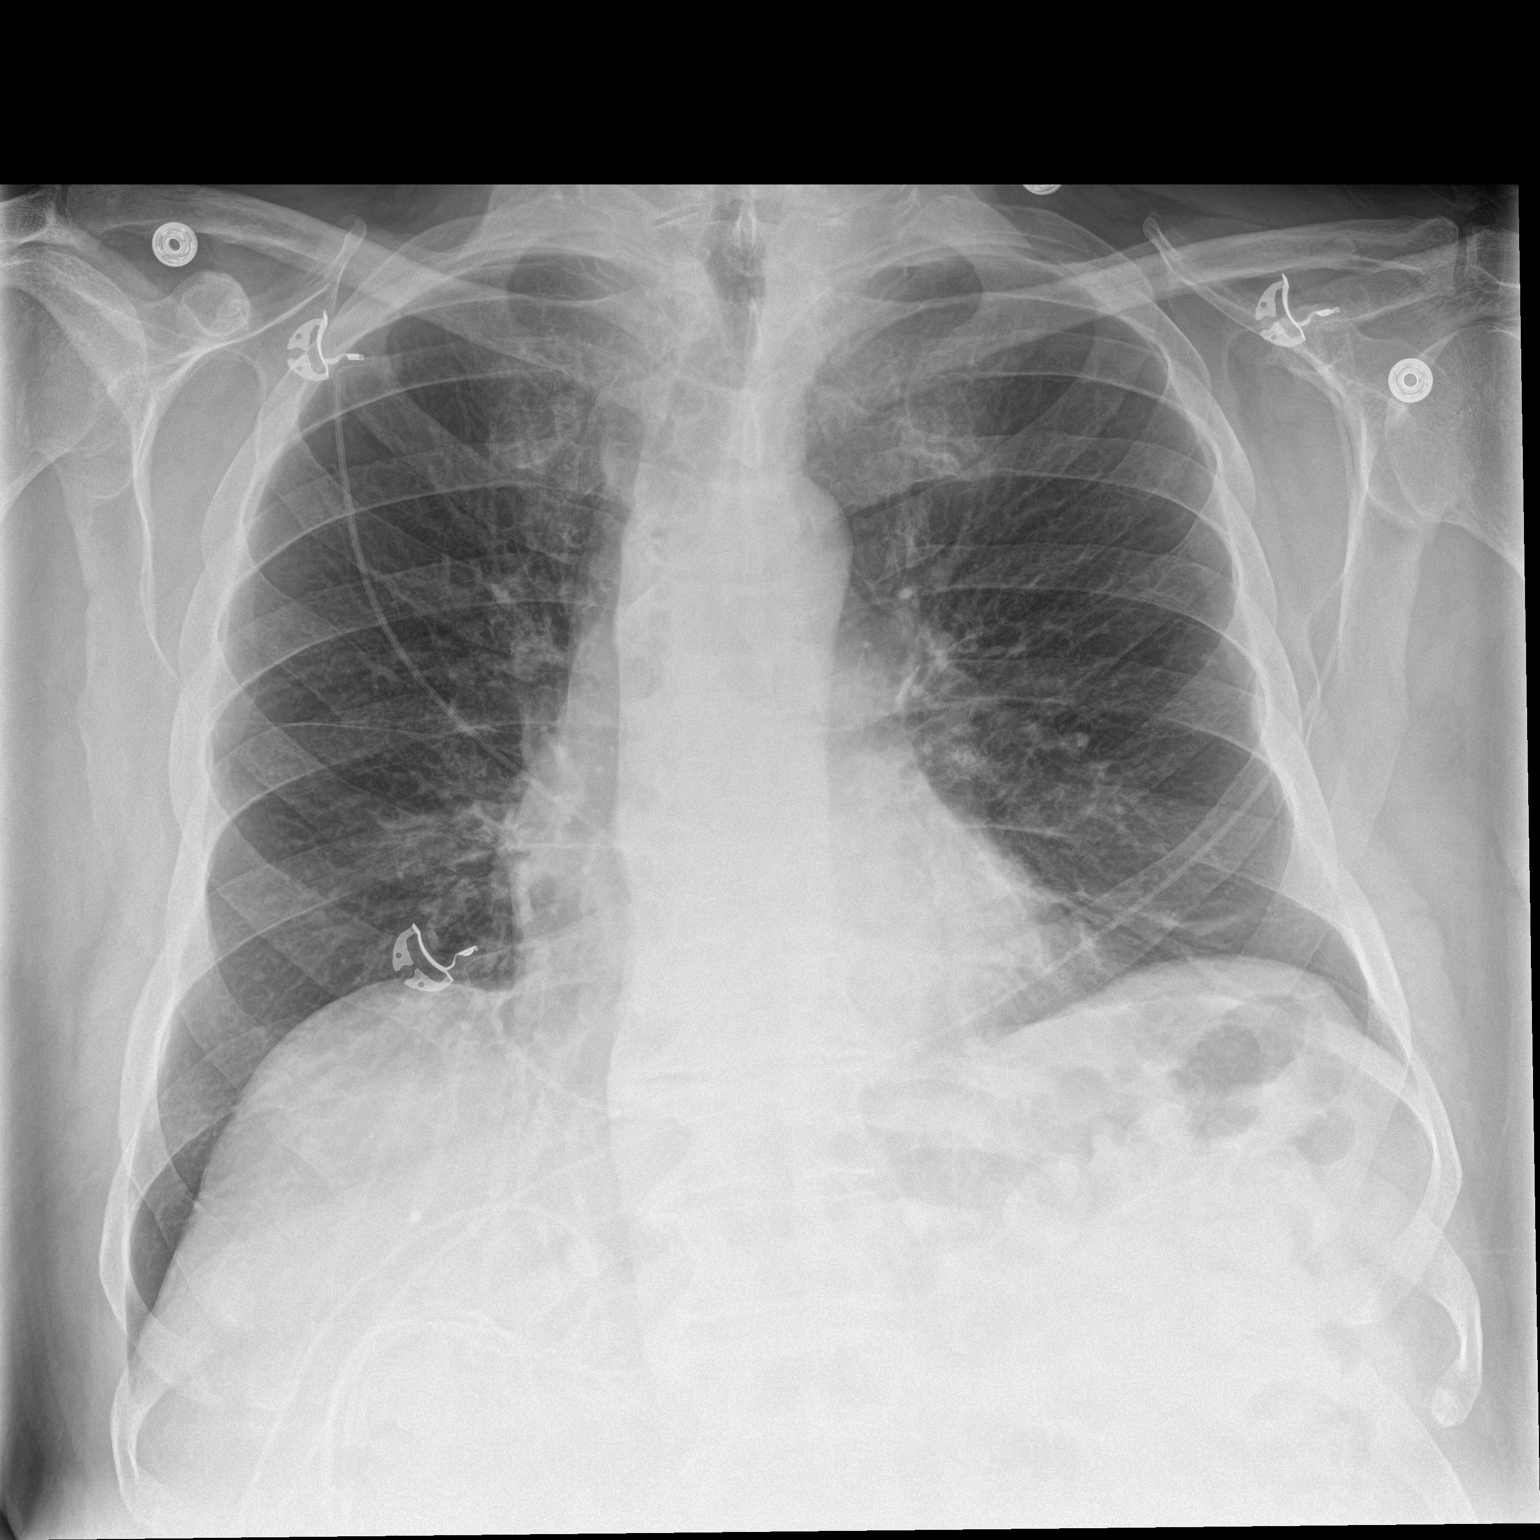

[chest lat]
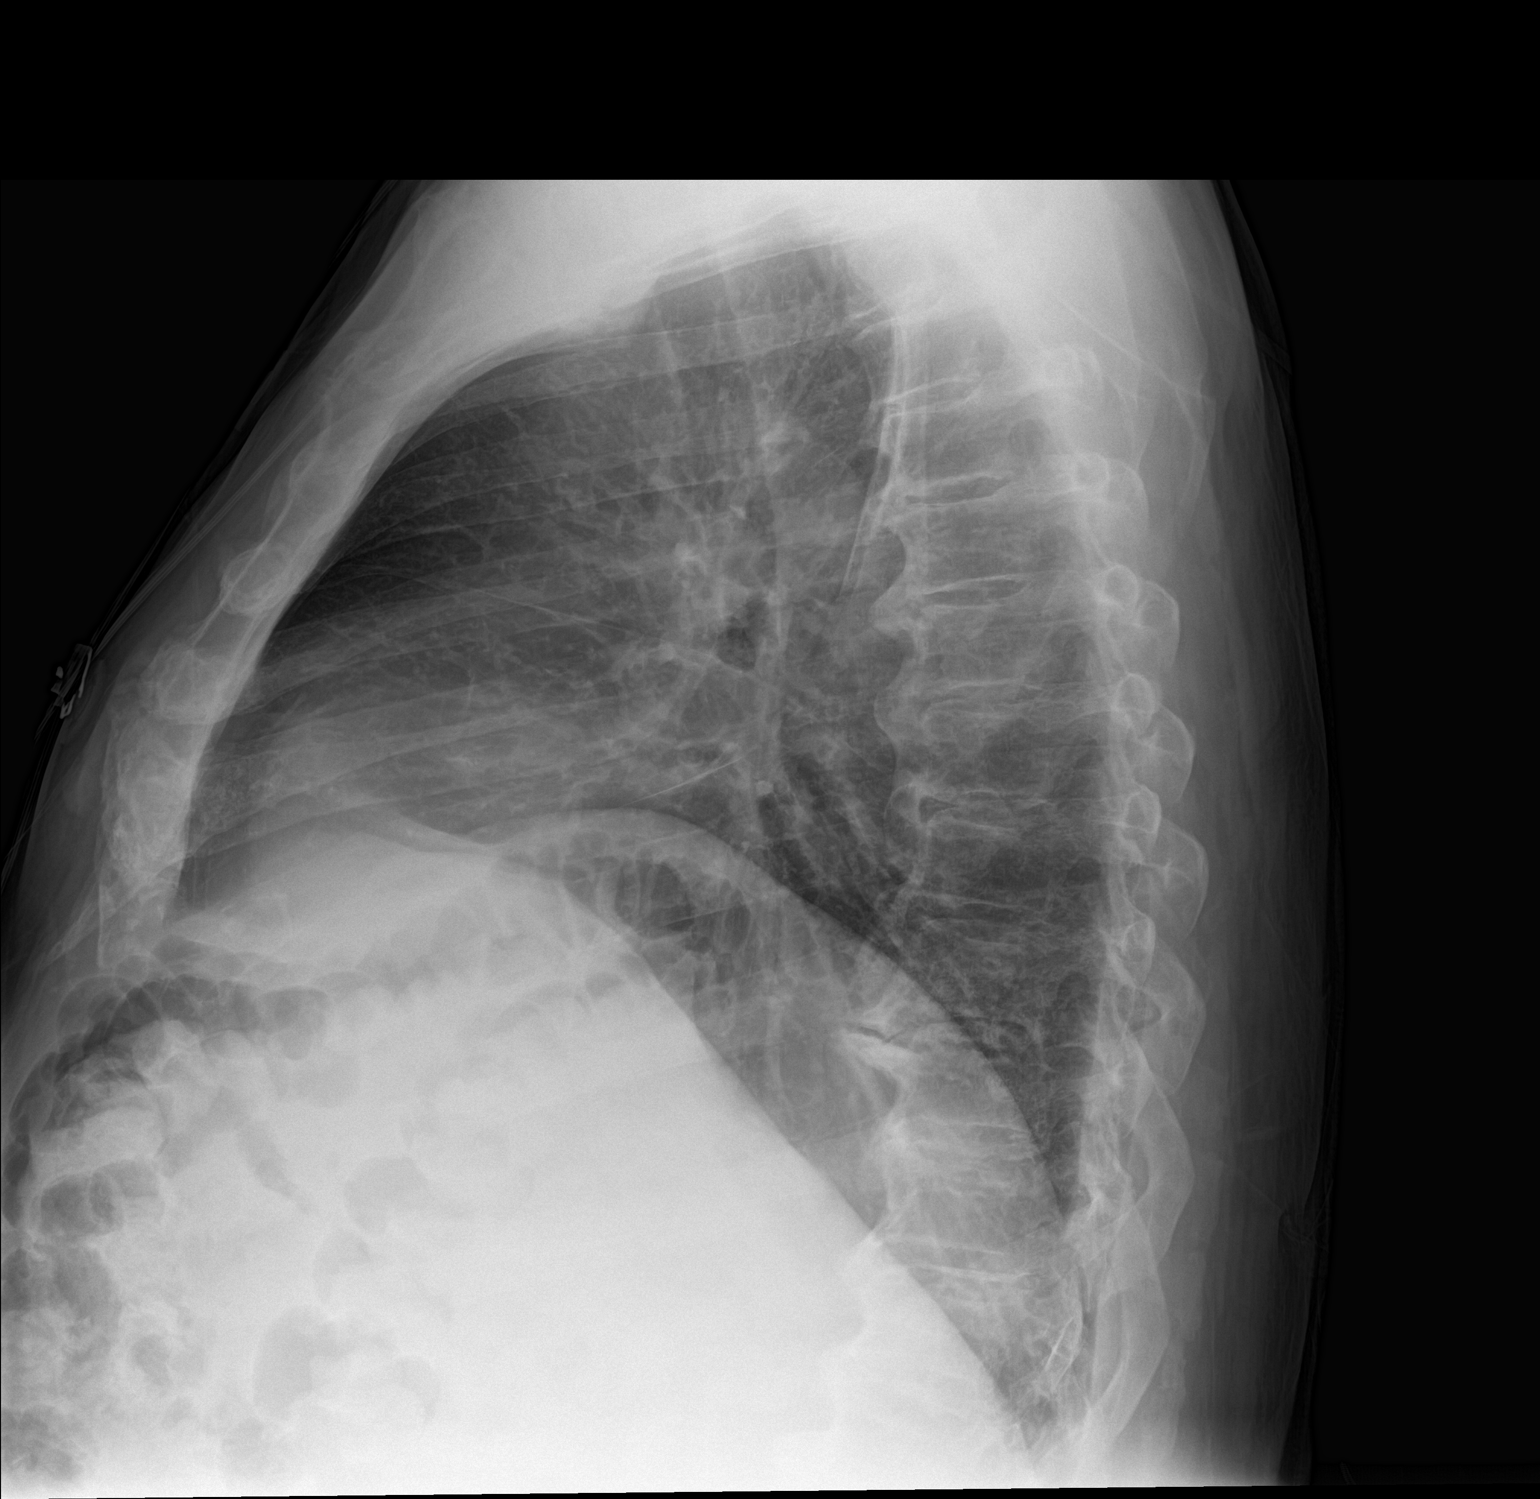

[2 of 2 positions shown; findings below may reference images not displayed]

FINDINGS: The lungs are mildly hypoinflated. There is left basilar atelectasis
or scarring. The perihilar interstitial markings are coarse. The
heart and pulmonary vascularity are normal. There is no pleural
effusion. There is calcification of the anterior longitudinal
ligament of the thoracic spine with mild multilevel degenerative
disc disease.
IMPRESSION: Mild perihilar interstitial prominence may reflect low-grade
interstitial edema. There is no cardiomegaly. Left basilar scarring
or atelectasis.

## 2017-04-20 IMAGING — CR DG CHEST 1V PORT
1 series · 1 of 1 positions shown · non-contrast
Comparison: 03/03/2015

CLINICAL DATA: Postop

EXAM:
PORTABLE CHEST 1 VIEW

[AP]
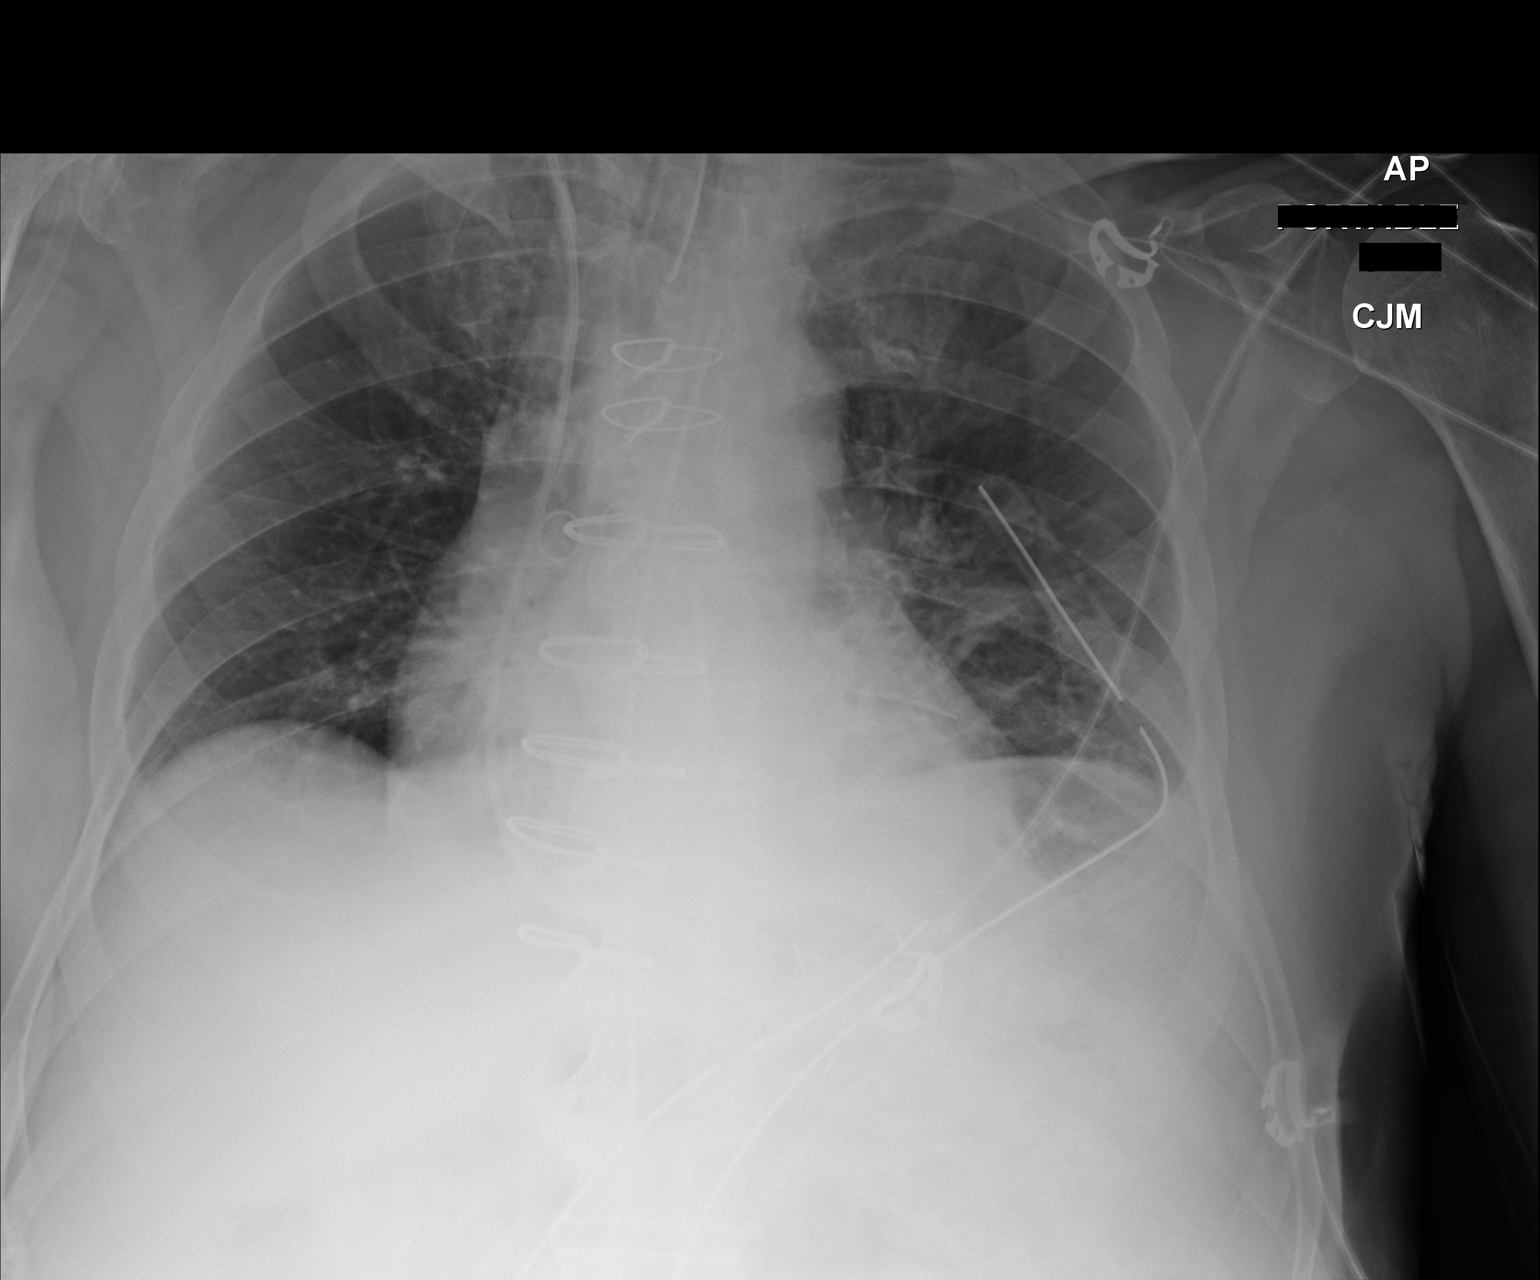

[1 of 1 positions shown; findings below may reference images not displayed]

FINDINGS: Left chest and mediastinal tubes in place. No pneumothorax. Low lung
volumes. Subsegmental atelectasis at the left base. Endotracheal
tube tip 5.0 cm from the carina. Right jugular introducer and
Swan-Ganz catheter tip in the pulmonary artery. No sign of pulmonary
edema or vascular congestion. Normal heart size.
IMPRESSION: Postop.

Tubular device is in a appropriate position.

Left basilar atelectasis.

No CHF or pneumothorax.

## 2017-04-21 IMAGING — CR DG CHEST 1V PORT
1 series · 1 of 1 positions shown · non-contrast
Comparison: Portable chest x-ray March 10, 2015

CLINICAL DATA: Status post CABG yesterday, chronic renal failure

EXAM:
PORTABLE CHEST 1 VIEW

[AP]
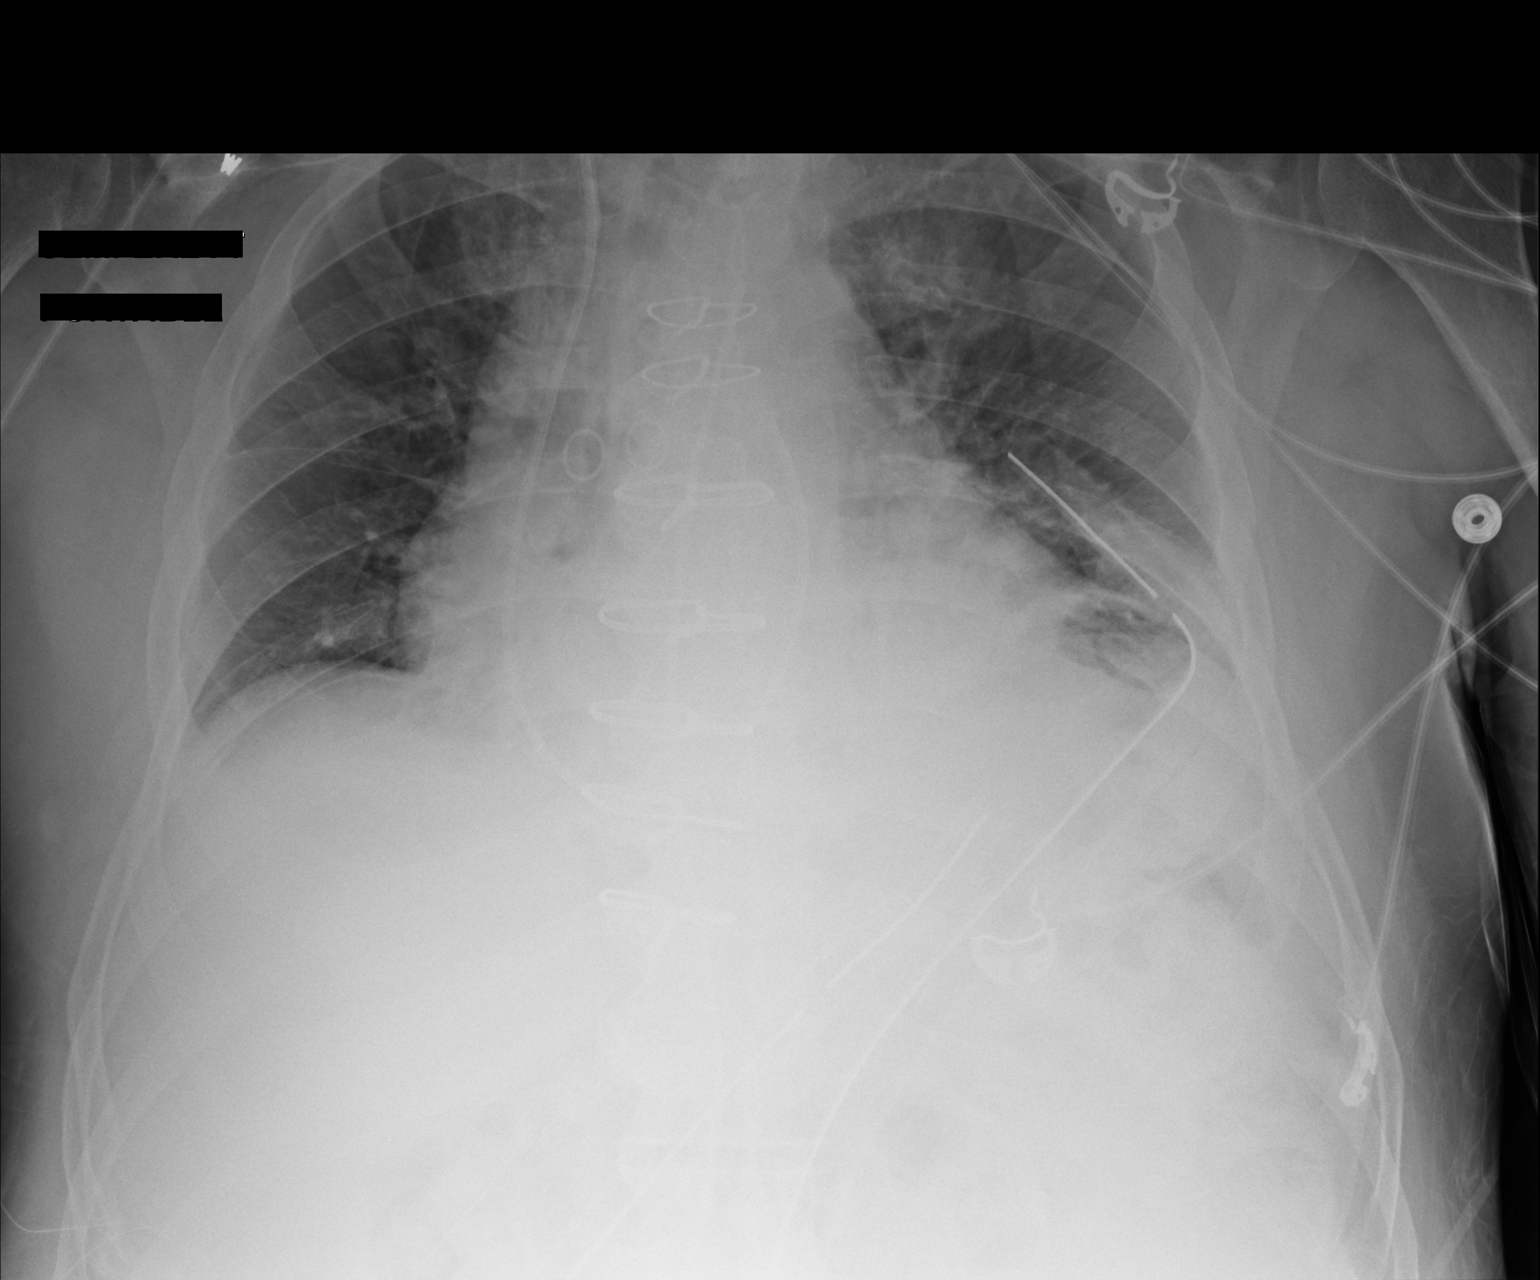

[1 of 1 positions shown; findings below may reference images not displayed]

FINDINGS: There has been interval extubation of the trachea and of the
esophagus. The lungs are mildly hypoinflated. Two left-sided chest
tubes are in stable position as is the mediastinal drain. There is
no pneumothorax or significant pleural effusion. There is left
basilar atelectasis. The cardiac silhouette remains enlarged. The
Swan-Ganz catheter tip projects in the distal portion of the
pulmonary artery trunk. The sternal wires are intact.
IMPRESSION: Post CABG changes. Stable mild hypoinflation postextubation.
Persistent left lower lobe atelectasis. The remaining support tubes
are in reasonable position.

## 2017-04-23 IMAGING — CR DG CHEST 1V PORT
1 series · 1 of 1 positions shown · non-contrast
Comparison: Portable chest x-ray March 12, 2015

CLINICAL DATA: Status post CABG 3 days ago

EXAM:
PORTABLE CHEST 1 VIEW

[AP]
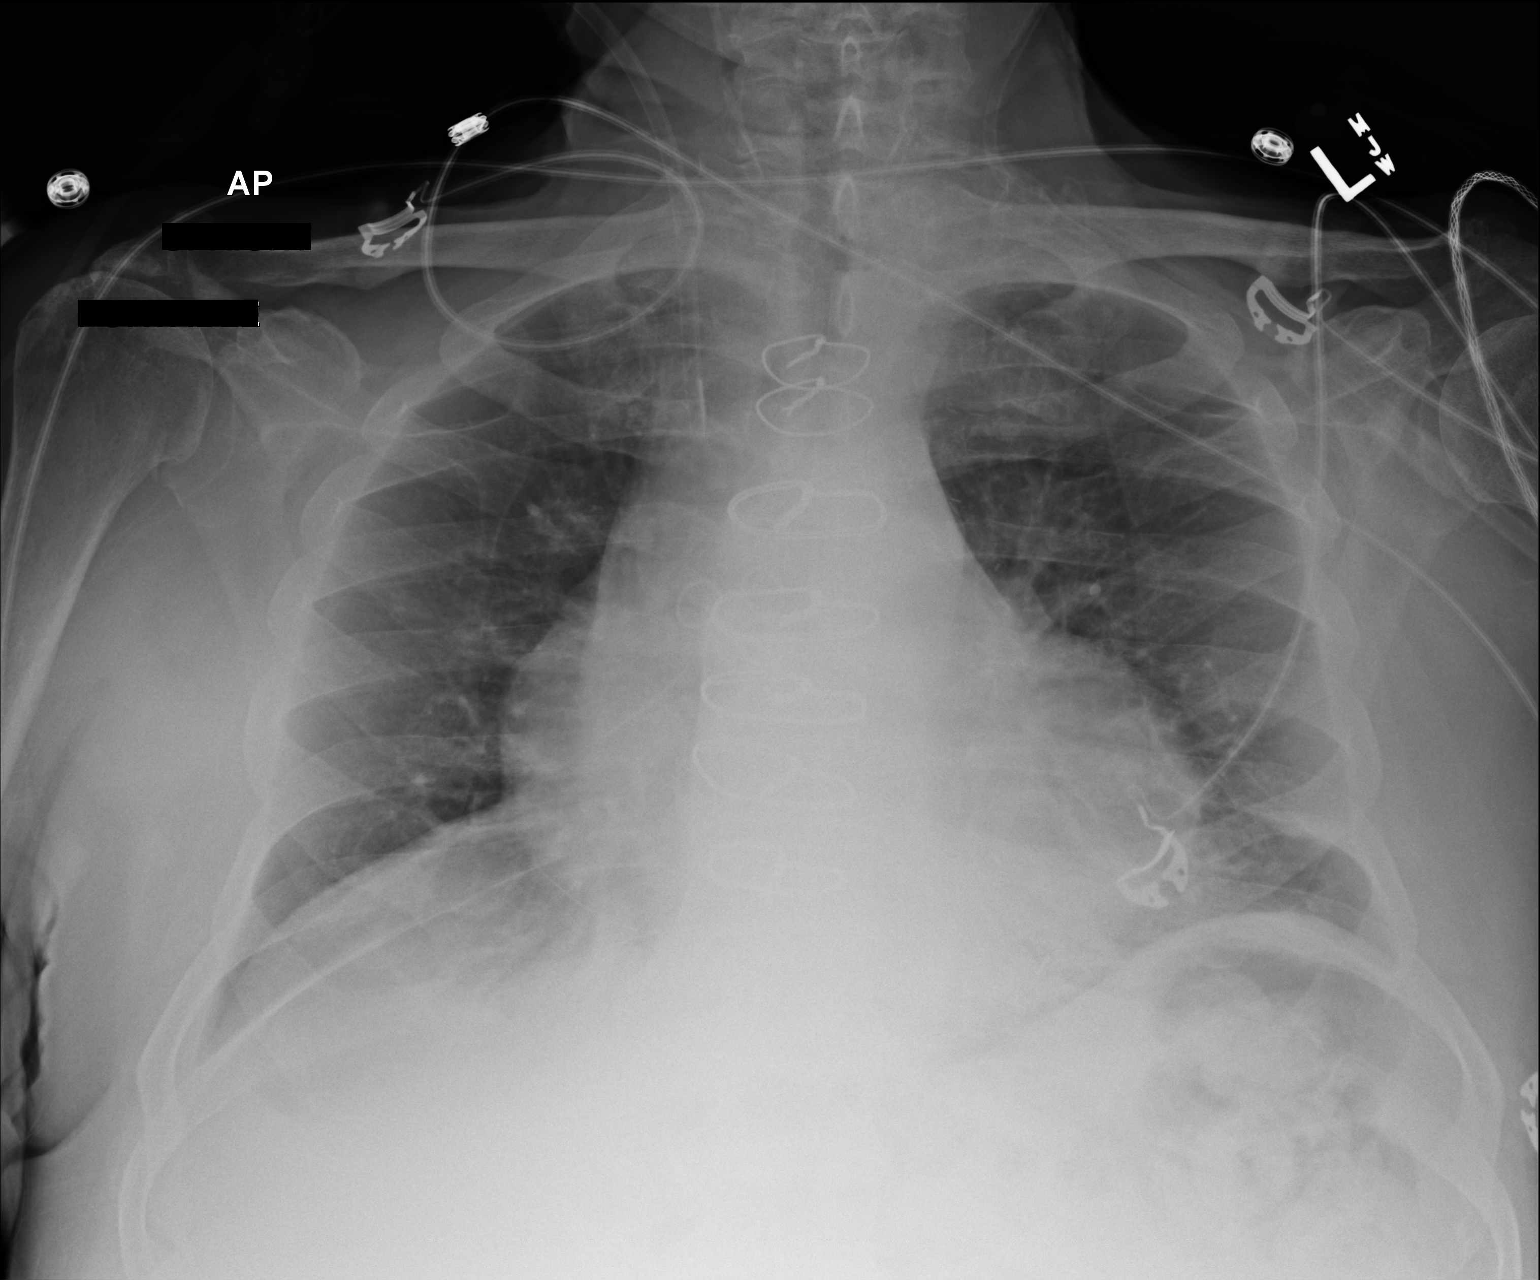

[1 of 1 positions shown; findings below may reference images not displayed]

FINDINGS: The lungs remain mildly hypoinflated. Subsegmental atelectasis at
the left lung base has improved. Minimal atelectatic changes at the
right lung base is less conspicuous as well. The cardiac silhouette
remains in it is enlarged but is accentuated by the apical lordotic
positioning. The pulmonary vascularity is not clearly engorged. The
right internal jugular Cordis sheath projects over the proximal SVC.
The sternal wires are intact.
IMPRESSION: Mild improvement in pulmonary interstitial edema and bibasilar
atelectasis. Today's study is obtained in a poor chronic lordotic
projection which accentuates the cardiac silhouette. There is no
pneumothorax.

## 2017-10-05 ENCOUNTER — Ambulatory Visit: Payer: Medicare HMO | Admitting: Cardiovascular Disease

## 2017-10-22 ENCOUNTER — Other Ambulatory Visit: Payer: Self-pay | Admitting: Cardiovascular Disease

## 2018-01-11 ENCOUNTER — Encounter: Payer: Self-pay | Admitting: Cardiovascular Disease

## 2018-04-26 ENCOUNTER — Telehealth: Payer: Self-pay | Admitting: *Deleted

## 2018-04-26 NOTE — Telephone Encounter (Signed)
Pt contacted per Dr Bronson Ing pt declined telehealth appt at this time but made aware this was an option if needed. History reviewed. No symptoms to suggest any unstable cardiac conditions. Based on discussion, with current pandemic situation, we have postponed 04/27/18 appointment until July 2020. If symptoms change, pt has been instructed to contact our office

## 2018-04-30 ENCOUNTER — Ambulatory Visit: Payer: Medicare HMO | Admitting: Cardiovascular Disease

## 2018-07-24 ENCOUNTER — Telehealth: Payer: Self-pay | Admitting: Cardiovascular Disease

## 2018-07-24 NOTE — Telephone Encounter (Signed)
Virtual Visit Pre-Appointment Phone Call  "(Name), I am calling you today to discuss your upcoming appointment. We are currently trying to limit exposure to the virus that causes COVID-19 by seeing patients at home rather than in the office."  1. "What is the BEST phone number to call the day of the visit?" - include this in appointment notes  2. Do you have or have access to (through a family member/friend) a smartphone with video capability that we can use for your visit?" a. If yes - list this number in appt notes as cell (if different from BEST phone #) and list the appointment type as a VIDEO visit in appointment notes b. If no - list the appointment type as a PHONE visit in appointment notes  3. Confirm consent - "In the setting of the current Covid19 crisis, you are scheduled for a (phone or video) visit with your provider on (date) at (time).  Just as we do with many in-office visits, in order for you to participate in this visit, we must obtain consent.  If you'd like, I can send this to your mychart (if signed up) or email for you to review.  Otherwise, I can obtain your verbal consent now.  All virtual visits are billed to your insurance company just like a normal visit would be.  By agreeing to a virtual visit, we'd like you to understand that the technology does not allow for your provider to perform an examination, and thus may limit your provider's ability to fully assess your condition. If your provider identifies any concerns that need to be evaluated in person, we will make arrangements to do so.  Finally, though the technology is pretty good, we cannot assure that it will always work on either your or our end, and in the setting of a video visit, we may have to convert it to a phone-only visit.  In either situation, we cannot ensure that we have a secure connection.  Are you willing to proceed?" STAFF: Did the patient verbally acknowledge consent to telehealth visit? Document  YES/NO here: yes  4. Advise patient to be prepared - "Two hours prior to your appointment, go ahead and check your blood pressure, pulse, oxygen saturation, and your weight (if you have the equipment to check those) and write them all down. When your visit starts, your provider will ask you for this information. If you have an Apple Watch or Kardia device, please plan to have heart rate information ready on the day of your appointment. Please have a pen and paper handy nearby the day of the visit as well."  5. Give patient instructions for MyChart download to smartphone OR Doximity/Doxy.me as below if video visit (depending on what platform provider is using)  6. Inform patient they will receive a phone call 15 minutes prior to their appointment time (may be from unknown caller ID) so they should be prepared to answer    TELEPHONE CALL NOTE  ANGELINA NEECE has been deemed a candidate for a follow-up tele-health visit to limit community exposure during the Covid-19 pandemic. I spoke with the patient via phone to ensure availability of phone/video source, confirm preferred email & phone number, and discuss instructions and expectations.  I reminded [name] [name] to be prepared with any vital sign and/or heart rhythm information that could potentially be obtained via home monitoring, at the time of his visit. I reminded [name] [name] to expect a phone call prior to  his visit.  Weston Anna 07/24/2018 3:45 PM   INSTRUCTIONS FOR DOWNLOADING THE MYCHART APP TO SMARTPHONE  - The patient must first make sure to have activated MyChart and know their login information - If Apple, go to [employer] and type in MyChart in the search bar and download the app. If Android, ask patient to go to [employer] and type in [facility] in the search bar and download the app. The app is free but as with any other app downloads, their phone may require them to verify saved payment information or Apple/Android  password.  - The patient will need to then log into the app with their MyChart username and password, and select  as their healthcare provider to link the account. When it is time for your visit, go to the MyChart app, find appointments, and click Begin Video Visit. Be sure to Select Allow for your device to access the Microphone and Camera for your visit. You will then be connected, and your provider will be with you shortly.  **If they have any issues connecting, or need assistance please contact MyChart service desk [phone])**  **If using a computer, in order to ensure the best quality for their visit they will need to use either of the following Internet Browsers: [employer], or Google Chrome**  IF USING DOXIMITY or DOXY.ME - The patient will receive a link just prior to their visit by text.     FULL LENGTH CONSENT FOR TELE-HEALTH VISIT   I hereby voluntarily request, consent and authorize [facility] and its employed or contracted physicians, physician assistants, nurse practitioners or other licensed health care professionals (the Practitioner), to provide me with telemedicine health care services (the Services") as deemed necessary by the treating Practitioner. I acknowledge and consent to receive the Services by the Practitioner via telemedicine. I understand that the telemedicine visit will involve communicating with the Practitioner through live audiovisual communication technology and the disclosure of certain medical information by electronic transmission. I acknowledge that I have been given the opportunity to request an in-person assessment or other available alternative prior to the telemedicine visit and am voluntarily participating in the telemedicine visit.  I understand that I have the right to withhold or withdraw my consent to the use of telemedicine in the course of my care at any time, without affecting my right to future care or treatment,  and that the Practitioner or I may terminate the telemedicine visit at any time. I understand that I have the right to inspect all information obtained and/or recorded in the course of the telemedicine visit and may receive copies of available information for a reasonable fee.  I understand that some of the potential risks of receiving the Services via telemedicine include:   Delay or interruption in medical evaluation due to technological equipment failure or disruption;  Information transmitted may not be sufficient (e.g. poor resolution of images) to allow for appropriate medical decision making by the Practitioner; and/or   In rare instances, security protocols could fail, causing a breach of personal health information.  Furthermore, I acknowledge that it is my responsibility to provide information about my medical history, conditions and care that is complete and accurate to the best of my ability. I acknowledge that Practitioner's advice, recommendations, and/or decision may be based on factors not within their control, such as incomplete or inaccurate data provided by me or distortions of diagnostic images or specimens that may result from electronic transmissions. I  understand that the practice of medicine is not an exact science and that Practitioner makes no warranties or guarantees regarding treatment outcomes. I acknowledge that I will receive a copy of this consent concurrently upon execution via email to the email address I last provided but may also request a printed copy by calling the office of [facility].    I understand that my insurance will be billed for this visit.   I have read or had this consent read to me.  I understand the contents of this consent, which adequately explains the benefits and risks of the Services being provided via telemedicine.   I have been provided ample opportunity to ask questions regarding this consent and the Services and have had my questions  answered to my satisfaction.  I give my informed consent for the services to be provided through the use of telemedicine in my medical care  By participating in this telemedicine visit I agree to the above.

## 2018-07-31 ENCOUNTER — Ambulatory Visit: Payer: Self-pay | Admitting: Cardiovascular Disease

## 2018-07-31 ENCOUNTER — Telehealth: Payer: Self-pay | Admitting: Cardiovascular Disease

## 2018-10-08 ENCOUNTER — Other Ambulatory Visit: Payer: Self-pay

## 2018-10-08 ENCOUNTER — Encounter: Payer: Self-pay | Admitting: Cardiovascular Disease

## 2018-10-08 ENCOUNTER — Ambulatory Visit (INDEPENDENT_AMBULATORY_CARE_PROVIDER_SITE_OTHER): Payer: Medicare HMO | Admitting: Cardiovascular Disease

## 2018-10-08 VITALS — BP 130/72 | HR 78 | Ht 71.0 in | Wt 215.0 lb

## 2018-10-08 DIAGNOSIS — I10 Essential (primary) hypertension: Secondary | ICD-10-CM | POA: Diagnosis not present

## 2018-10-08 DIAGNOSIS — E785 Hyperlipidemia, unspecified: Secondary | ICD-10-CM

## 2018-10-08 DIAGNOSIS — I25708 Atherosclerosis of coronary artery bypass graft(s), unspecified, with other forms of angina pectoris: Secondary | ICD-10-CM | POA: Diagnosis not present

## 2018-10-08 DIAGNOSIS — I6523 Occlusion and stenosis of bilateral carotid arteries: Secondary | ICD-10-CM | POA: Diagnosis not present

## 2018-10-08 NOTE — Progress Notes (Signed)
SUBJECTIVE: The patient presents for follow-up of coronary artery disease with a history of four-vessel CABG in March 2017. He underwent right carotid endarterectomy in June 2017.  ECG performed today which I personally reviewed demonstrated sinus rhythm with nonspecific IVCD.  The patient denies any symptoms of chest pain, palpitations, shortness of breath, lightheadedness, dizziness, leg swelling, orthopnea, PND, and syncope.  He uses a recumbent bicycle at home and does other leg exercises.    Review of Systems: As per "subjective", otherwise negative.  No Known Allergies  Current Outpatient Medications  Medication Sig Dispense Refill  . aspirin EC 81 MG tablet Take 1 tablet (81 mg total) by mouth daily.    . calcium citrate-vitamin D (CITRACAL+D) 315-200 MG-UNIT tablet Take 1 tablet by mouth 2 (two) times daily.    . Cholecalciferol (VITAMIN D3) 2000 UNITS TABS Take 2,000 Units by mouth daily.     . clonazePAM (KLONOPIN) 1 MG tablet Take 1 mg by mouth daily.     . clotrimazole-betamethasone (LOTRISONE) cream Apply 1 application topically as needed (for skin).     Marland Kitchen doxycycline (PERIOSTAT) 20 MG tablet Take 20 mg by mouth daily as needed.    Marland Kitchen glimepiride (AMARYL) 4 MG tablet Take 4 mg by mouth daily with breakfast.    . lovastatin (MEVACOR) 20 MG tablet TAKE 1 TABLET BY MOUTH EVERYDAY AT BEDTIME 90 tablet 3  . metroNIDAZOLE (METROGEL) 1 % gel Apply 1 application topically daily.      . Multiple Vitamins-Minerals (MULTIVITAMIN ADULT PO) Take 1 tablet by mouth daily.     . nitroGLYCERIN (NITROSTAT) 0.4 MG SL tablet Place 1 tablet (0.4 mg total) under the tongue every 5 (five) minutes as needed. (Patient taking differently: Place 0.4 mg under the tongue every 5 (five) minutes as needed for chest pain. ) 25 tablet 3  . sodium polystyrene (KAYEXALATE) 15 GM/60ML suspension Take 30 g by mouth every 30 (thirty) days.     . vitamin C (ASCORBIC ACID) 500 MG tablet Take 500 mg by  mouth daily.     No current facility-administered medications for this visit.     Past Medical History:  Diagnosis Date  . Abnormal nuclear stress test   . Arthritis   . Chronic kidney disease    chronic renal insufficiency,single functional kidney  . Coronary artery disease    quiescent on medical therapy,abnormal exercise Cardiolite suggestive of apical ischemia;EF 54%,02/10  . Diabetes mellitus without complication (HCC)    Type 2  . Gout   . Hyperlipidemia    abnormal LFT's,resolved,secondary to lipitor  . Hypertension   . 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 Peripheral vascular disease (HCC)    carotid artery blockage  . Shortness of breath dyspnea    with exertion  . Tobacco abuse     Past Surgical History:  Procedure Laterality Date  . BLADDER SURGERY  1980  . CARDIAC CATHETERIZATION N/A 03/02/2015   Procedure: Left Heart Cath and Coronary Angiography;  Surgeon: Burnell Blanks, MD;  Location: [facility];  Service: Cardiovascular;  Laterality: N/A;  . CARDIAC CATHETERIZATION N/A 03/02/2015   Procedure: Intravascular Ultrasound/IVUS;  Surgeon: Burnell Blanks, MD;  Location: [facility];  Service: Cardiovascular;  Laterality: N/A;  . CORONARY ARTERY BYPASS GRAFT N/A 03/10/2015   Procedure: CORONARY ARTERY BYPASS GRAFTING (CABG) x four  , using left internal mammary artery and bilateral greater saphenous veins harvested endoscopically;  Surgeon: Ivin Poot, MD;  Location: [facility];  Service: Open Heart Surgery;  Laterality: N/A;  . ENDARTERECTOMY Right 06/08/2015   Procedure: ENDARTERECTOMY CAROTID;  Surgeon: Elam Dutch, MD;  Location: [facility];  Service: Vascular;  Laterality: Right;  . PENECTOMY     surgery for carcinoma of penis  . 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 TEE WITHOUT CARDIOVERSION N/A 03/10/2015   Procedure: TRANSESOPHAGEAL ECHOCARDIOGRAM (TEE);  Surgeon: Ivin Poot, MD;   Location: [facility];  Service: Open Heart Surgery;  Laterality: N/A;    Social History   Socioeconomic History  . Marital status: Widowed    Spouse name: Not on file  . Number of children: Not on file  . Years of education: Not on file  . Highest education level: Not on file  Occupational History  . Not on file  Social Needs  . Financial resource strain: Not on file  . Food insecurity    Worry: Not on file    Inability: Not on file  . Transportation needs    Medical: Not on file    Non-medical: Not on file  Tobacco Use  . Smoking status: Former Smoker    Packs/day: 1.00    Years: 25.00    Pack years: 25.00    Types: Cigarettes    Start date: 01/04/1955    Quit date: 01/03/1970    Years since quitting: 48.7  . Smokeless tobacco: Never Used  Substance and Sexual Activity  . Alcohol use: No    Alcohol/week: 0.0 standard drinks  . Drug use: No  . Sexual activity: Not on file  Lifestyle  . Physical activity    Days per week: Not on file    Minutes per session: Not on file  . Stress: Not on file  Relationships  . Social Herbalist on phone: Not on file    Gets together: Not on file    Attends religious service: Not on file    Active member of club or organization: Not on file    Attends meetings of clubs or organizations: Not on file    Relationship status: Not on file  . Intimate partner violence    Fear of current or ex partner: Not on file    Emotionally abused: Not on file    Physically abused: Not on file    Forced sexual activity: Not on file  Other Topics Concern  . Not on file  Social History Narrative  . Not on file     Vitals:   10/08/18 1258  BP: 130/72  Pulse: 78  SpO2: 97%  Weight: 215 lb (97.5 kg)  Height: 5\' 11"  (1.803 m)    Wt Readings from Last 3 Encounters:  10/08/18 215 lb (97.5 kg)  02/02/17 217 lb (98.4 kg)  09/23/16 212 lb (96.2 kg)     PHYSICAL EXAM General: NAD HEENT: Normal. Neck: No JVD, no thyromegaly. Lungs:  Clear to auscultation bilaterally with normal respiratory effort. CV: Regular rate and rhythm, normal S1/S2, no S3/S4, no murmur. No pretibial or periankle edema.  No carotid bruit.   Abdomen: Soft, nontender, no distention.  Neurologic: Alert and oriented.  Psych: Normal affect. Skin: Normal. Musculoskeletal: No gross deformities.      Labs: Lab Results  Component Value Date/Time   K 5.0 06/09/2015 05:35 AM   BUN 37 (H) 06/09/2015 05:35 AM   CREATININE 2.49 (H) 06/09/2015 05:35 AM   ALT  19 06/08/2015 06:43 AM   TSH 1.532 03/03/2015 05:52 AM   HGB 11.0 (L) 06/09/2015 05:35 AM     Lipids: Lab Results  Component Value Date/Time   LDLCALC 59 06/09/2015 05:35 AM   CHOL 135 06/09/2015 05:35 AM   TRIG 95 06/09/2015 05:35 AM   HDL 57 06/09/2015 05:35 AM       ASSESSMENT AND PLAN: 1. CAD s/p CABG: Symptomatically stable on aspirin and statin. No changes to therapy.  2. Essential HTN: Controlled. No changes to therapy.  3. Hyperlipidemia: Continue statin. I will obtain a copy of lipids from PCP.  4. 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 Continue aspirin and statin.  I will obtain follow-up carotid Dopplers.     Disposition: Follow up 1 year   Kate Sable, M.D., F.A.C.C.

## 2018-10-08 NOTE — Patient Instructions (Signed)
Medication Instructions:  Continue all current medications.  Labwork: none  Testing/Procedures:  Your physician has requested that you have a carotid duplex. This test is an ultrasound of the carotid arteries in your neck. It looks at blood flow through these arteries that supply the brain with blood. Allow one hour for this exam. There are no restrictions or special instructions.  Office will contact with results via phone or letter.    Follow-Up: Your physician wants you to follow up in:  1 year.  You will receive a reminder letter in the mail one-two months in advance.  If you don't receive a letter, please call our office to schedule the follow up appointment   Any Other Special Instructions Will Be Listed Below (If Applicable).  If you need a refill on your cardiac medications before your next appointment, please call your pharmacy.  

## 2018-10-09 ENCOUNTER — Encounter: Payer: Self-pay | Admitting: *Deleted

## 2018-10-11 ENCOUNTER — Other Ambulatory Visit: Payer: Self-pay

## 2018-10-11 ENCOUNTER — Ambulatory Visit (INDEPENDENT_AMBULATORY_CARE_PROVIDER_SITE_OTHER): Payer: Medicare HMO

## 2018-10-11 DIAGNOSIS — I6523 Occlusion and stenosis of bilateral carotid arteries: Secondary | ICD-10-CM

## 2018-10-12 ENCOUNTER — Telehealth: Payer: Self-pay | Admitting: *Deleted

## 2018-10-12 NOTE — Telephone Encounter (Signed)
Notes recorded by Laurine Blazer, LPN on 075-GRM at [id number] AM EDT  Patient notified, copy to pmd.  ------   Notes recorded by Laurine Blazer, LPN on 075-GRM at [id number] AM EDT  Left message to return call.   ------   Notes recorded by Herminio Commons, MD on 10/11/2018 at 2:55 PM EDT  Minimal blockages bilaterally. I will repeat in 2 to 3 years.

## 2018-11-08 ENCOUNTER — Other Ambulatory Visit: Payer: Self-pay | Admitting: Cardiovascular Disease

## 2019-10-28 ENCOUNTER — Telehealth: Payer: Self-pay | Admitting: *Deleted

## 2019-10-28 MED ORDER — LOVASTATIN 20 MG PO TABS: 20.0000 mg | ORAL_TABLET | Freq: Every day | ORAL | 0 refills | Status: DC

## 2019-10-28 NOTE — Telephone Encounter (Signed)
rx refill

## 2019-12-19 ENCOUNTER — Encounter: Payer: Self-pay | Admitting: Cardiology

## 2020-01-19 ENCOUNTER — Other Ambulatory Visit: Payer: Self-pay | Admitting: Family Medicine

## 2020-01-23 ENCOUNTER — Ambulatory Visit: Payer: Medicare HMO | Admitting: Cardiology

## 2020-01-23 ENCOUNTER — Encounter: Payer: Self-pay | Admitting: Cardiology

## 2020-01-23 ENCOUNTER — Encounter: Payer: Self-pay | Admitting: *Deleted

## 2020-01-23 VITALS — BP 140/70 | HR 74 | Ht 71.0 in | Wt 197.8 lb

## 2020-01-23 DIAGNOSIS — I6523 Occlusion and stenosis of bilateral carotid arteries: Secondary | ICD-10-CM

## 2020-01-23 DIAGNOSIS — I25810 Atherosclerosis of coronary artery bypass graft(s) without angina pectoris: Secondary | ICD-10-CM

## 2020-01-23 DIAGNOSIS — E782 Mixed hyperlipidemia: Secondary | ICD-10-CM | POA: Diagnosis not present

## 2020-01-23 MED ORDER — LOVASTATIN 20 MG PO TABS: ORAL_TABLET | ORAL | 3 refills | Status: DC

## 2020-01-23 NOTE — Patient Instructions (Signed)

## 2020-01-23 NOTE — Progress Notes (Signed)
Clinical Summary Mr. Cicero is a 80 y.o.male former patient of Dr Bronson Ing, this is our first visit.  1. CAD - history of CABG 03/2015 (LIMA-LAD, SVG-diag, SVG-OM1, SVG-PDA - 06/2015 echo: LVEF 60-65%, no WMAs, grade II diastolic dysfunction  - no chest pain, no SOB/DOE - compliant with meds   2. Carotid stenosis - history of right CEA June 2017  - neds repeat later this year - no recent symptoms    3. Hyperlipidemia - labs followed by pcp - has been on lovastatin    Has had covid vaccine x3  Past Medical History:  Diagnosis Date  . Abnormal nuclear stress test   . Arthritis   . Chronic kidney disease    chronic renal insufficiency,single functional kidney  . Coronary artery disease    quiescent on medical therapy,abnormal exercise Cardiolite suggestive of apical ischemia;EF 54%,02/10  . Diabetes mellitus without complication (HCC)    Type 2  . Gout   . Hyperlipidemia    abnormal LFT's,resolved,secondary to lipitor  . Hypertension   . 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 Peripheral vascular disease (HCC)    carotid artery blockage  . Shortness of breath dyspnea    with exertion  . Tobacco abuse      No Known Allergies   Current Outpatient Medications  Medication Sig Dispense Refill  . aspirin EC 81 MG tablet Take 1 tablet (81 mg total) by mouth daily.    . calcium citrate-vitamin D (CITRACAL+D) 315-200 MG-UNIT tablet Take 1 tablet by mouth 2 (two) times daily.    . Cholecalciferol (VITAMIN D3) 2000 UNITS TABS Take 2,000 Units by mouth daily.     . clonazePAM (KLONOPIN) 1 MG tablet Take 1 mg by mouth daily.     . clotrimazole-betamethasone (LOTRISONE) cream Apply 1 application topically as needed (for skin).     Marland Kitchen doxycycline (PERIOSTAT) 20 MG tablet Take 20 mg by mouth daily as needed.    Marland Kitchen glimepiride (AMARYL) 4 MG tablet Take 4 mg by mouth daily with breakfast.    . lovastatin (MEVACOR) 20 MG tablet TAKE 1 TABLET BY  MOUTH EVERYDAY AT BEDTIME 7 tablet 0  . metroNIDAZOLE (METROGEL) 1 % gel Apply 1 application topically daily.      . Multiple Vitamins-Minerals (MULTIVITAMIN ADULT PO) Take 1 tablet by mouth daily.     . nitroGLYCERIN (NITROSTAT) 0.4 MG SL tablet Place 1 tablet (0.4 mg total) under the tongue every 5 (five) minutes as needed. (Patient taking differently: Place 0.4 mg under the tongue every 5 (five) minutes as needed for chest pain. ) 25 tablet 3  . sodium polystyrene (KAYEXALATE) 15 GM/60ML suspension Take 30 g by mouth every 30 (thirty) days.     . vitamin C (ASCORBIC ACID) 500 MG tablet Take 500 mg by mouth daily.     No current facility-administered medications for this visit.     Past Surgical History:  Procedure Laterality Date  . BLADDER SURGERY  1980  . CARDIAC CATHETERIZATION N/A 03/02/2015   Procedure: Left Heart Cath and Coronary Angiography;  Surgeon: Burnell Blanks, MD;  Location: [facility];  Service: Cardiovascular;  Laterality: N/A;  . CARDIAC CATHETERIZATION N/A 03/02/2015   Procedure: Intravascular Ultrasound/IVUS;  Surgeon: Burnell Blanks, MD;  Location: [facility];  Service: Cardiovascular;  Laterality: N/A;  . CORONARY ARTERY BYPASS GRAFT N/A 03/10/2015   Procedure: CORONARY ARTERY BYPASS GRAFTING (CABG) x four  ,  using left internal mammary artery and bilateral greater saphenous veins harvested endoscopically;  Surgeon: Ivin Poot, MD;  Location: [facility];  Service: Open Heart Surgery;  Laterality: N/A;  . ENDARTERECTOMY Right 06/08/2015   Procedure: ENDARTERECTOMY CAROTID;  Surgeon: Elam Dutch, MD;  Location: [facility];  Service: Vascular;  Laterality: Right;  . PENECTOMY     surgery for carcinoma of penis  . 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 TEE WITHOUT CARDIOVERSION N/A 03/10/2015   Procedure: TRANSESOPHAGEAL ECHOCARDIOGRAM (TEE);  Surgeon: Ivin Poot, MD;  Location: [facility];  Service: Open Heart Surgery;   Laterality: N/A;     No Known Allergies    Family History  Problem Relation Age of Onset  . Liver disease Mother   . Gallbladder disease Father   . Heart attack Sister   . Bone cancer Sister      Social History Mr. Wheeling reports that he quit smoking about 50 years ago. His smoking use included cigarettes. He started smoking about 65 years ago. He has a 25.00 pack-year smoking history. He has never used smokeless tobacco. Mr. Panther reports no history of alcohol use.   Review of Systems CONSTITUTIONAL: No weight loss, fever, chills, weakness or fatigue.  HEENT: Eyes: No visual loss, blurred vision, double vision or yellow sclerae.No hearing loss, sneezing, congestion, runny nose or sore throat.  SKIN: No rash or itching.  CARDIOVASCULAR: per hpi RESPIRATORY: No shortness of breath, cough or sputum.  GASTROINTESTINAL: No anorexia, nausea, vomiting or diarrhea. No abdominal pain or blood.  GENITOURINARY: No burning on urination, no polyuria NEUROLOGICAL: No headache, dizziness, syncope, paralysis, ataxia, numbness or tingling in the extremities. No change in bowel or bladder control.  MUSCULOSKELETAL: No muscle, back pain, joint pain or stiffness.  LYMPHATICS: No enlarged nodes. No history of splenectomy.  PSYCHIATRIC: No history of depression or anxiety.  ENDOCRINOLOGIC: No reports of sweating, cold or heat intolerance. No polyuria or polydipsia.  Marland Kitchen   Physical Examination Today's Vitals   01/23/20 1428  BP: 140/70  Pulse: 74  SpO2: 98%  Weight: 197 lb 12.8 oz (89.7 kg)  Height: 5\' 11"  (1.803 m)   Body mass index is 27.59 kg/m.  Gen: resting comfortably, no acute distress HEENT: no scleral icterus, pupils equal round and reactive, no palptable cervical adenopathy,  CV: RRR, no m/r/g, no jvd Resp: Clear to auscultation bilaterally GI: abdomen is soft, non-tender, non-distended, normal bowel sounds, no hepatosplenomegaly MSK: extremities are warm, no edema.  Skin:  warm, no rash Neuro:  no focal deficits Psych: appropriate affect     Assessment and Plan  1. CAD - no recent symptoms, continue current meds EKG shows NSR, no ischemic changes   2. Carotid stenosis - continue medical therapy, repeat US later this year  3. Hyperlipidemia - request pcp labs, continue statin      Arnoldo Lenis, M.D.

## 2021-02-15 ENCOUNTER — Other Ambulatory Visit: Payer: Self-pay | Admitting: Cardiology

## 2021-06-21 ENCOUNTER — Other Ambulatory Visit: Payer: Self-pay | Admitting: Cardiology

## 2021-06-30 ENCOUNTER — Telehealth: Payer: Self-pay | Admitting: *Deleted

## 2021-06-30 ENCOUNTER — Encounter: Payer: Self-pay | Admitting: *Deleted

## 2021-06-30 MED ORDER — LOVASTATIN 20 MG PO TABS: 20.0000 mg | ORAL_TABLET | Freq: Every day | ORAL | 0 refills | Status: DC

## 2021-06-30 NOTE — Telephone Encounter (Signed)
  Patient Consent for Virtual Visit        [name] [name] has provided verbal consent on 06/30/2021 for a virtual visit (video or telephone).   CONSENT FOR VIRTUAL VISIT FOR:  [name] [name]  By participating in this virtual visit I agree to the following:  I hereby voluntarily request, consent and authorize [facility] and its employed or contracted physicians, physician assistants, nurse practitioners or other licensed health care professionals (the Practitioner), to provide me with telemedicine health care services (the "Services") as deemed necessary by the treating Practitioner. I acknowledge and consent to receive the Services by the Practitioner via telemedicine. I understand that the telemedicine visit will involve communicating with the Practitioner through live audiovisual communication technology and the disclosure of certain medical information by electronic transmission. I acknowledge that I have been given the opportunity to request an in-person assessment or other available alternative prior to the telemedicine visit and am voluntarily participating in the telemedicine visit.  I understand that I have the right to withhold or withdraw my consent to the use of telemedicine in the course of my care at any time, without affecting my right to future care or treatment, and that the Practitioner or I may terminate the telemedicine visit at any time. I understand that I have the right to inspect all information obtained and/or recorded in the course of the telemedicine visit and may receive copies of available information for a reasonable fee.  I understand that some of the potential risks of receiving the Services via telemedicine include:  Delay or interruption in medical evaluation due to technological equipment failure or disruption; Information transmitted may not be sufficient (e.g. poor resolution of images) to allow for appropriate medical decision making by the Practitioner; and/or   In rare instances, security protocols could fail, causing a breach of personal health information.  Furthermore, I acknowledge that it is my responsibility to provide information about my medical history, conditions and care that is complete and accurate to the best of my ability. I acknowledge that Practitioner's advice, recommendations, and/or decision may be based on factors not within their control, such as incomplete or inaccurate data provided by me or distortions of diagnostic images or specimens that may result from electronic transmissions. I understand that the practice of medicine is not an exact science and that Practitioner makes no warranties or guarantees regarding treatment outcomes. I acknowledge that a copy of this consent can be made available to me via my patient portal ([facility]), or I can request a printed copy by calling the office of [facility].    I understand that my insurance will be billed for this visit.   I have read or had this consent read to me. I understand the contents of this consent, which adequately explains the benefits and risks of the Services being provided via telemedicine.  I have been provided ample opportunity to ask questions regarding this consent and the Services and have had my questions answered to my satisfaction. I give my informed consent for the services to be provided through the use of telemedicine in my medical care

## 2021-07-02 ENCOUNTER — Other Ambulatory Visit: Payer: Self-pay

## 2021-07-02 MED ORDER — LOVASTATIN 20 MG PO TABS: 20.0000 mg | ORAL_TABLET | Freq: Every day | ORAL | 0 refills | Status: AC

## 2021-08-03 ENCOUNTER — Telehealth: Payer: Medicare HMO | Admitting: Cardiology

## 2023-03-22 ENCOUNTER — Telehealth: Payer: Self-pay | Admitting: Cardiology

## 2023-03-22 NOTE — Telephone Encounter (Signed)
 Called pt to sch apt from recall with Dr. 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 Pt stated he has problems getting transportation for appointments. States he's currently not having any heart problems at this time and if he does he will probably need to get an appointment with a cardiologist in [place] due to his transportation issues. Stated he appreciated the call and if he has any problems he will give [place] a call.   I will delete recall.
